# Patient Record
Sex: Female | Born: 1940 | Race: Black or African American | Hispanic: No | Marital: Married | Smoking: Former smoker
Health system: Southern US, Community
[De-identification: ages and names within clinical notes are randomized; demographics above are authoritative.]

## PROBLEM LIST (undated history)

## (undated) DIAGNOSIS — R911 Solitary pulmonary nodule: Secondary | ICD-10-CM

## (undated) DIAGNOSIS — I10 Essential (primary) hypertension: Secondary | ICD-10-CM

## (undated) DIAGNOSIS — R059 Cough, unspecified: Secondary | ICD-10-CM

## (undated) DIAGNOSIS — Z860100 Personal history of colon polyps, unspecified: Secondary | ICD-10-CM

## (undated) DIAGNOSIS — K272 Acute peptic ulcer, site unspecified, with both hemorrhage and perforation: Secondary | ICD-10-CM

## (undated) DIAGNOSIS — R05 Cough: Secondary | ICD-10-CM

## (undated) DIAGNOSIS — C3492 Malignant neoplasm of unspecified part of left bronchus or lung: Secondary | ICD-10-CM

## (undated) DIAGNOSIS — M1612 Unilateral primary osteoarthritis, left hip: Secondary | ICD-10-CM

## (undated) DIAGNOSIS — K219 Gastro-esophageal reflux disease without esophagitis: Secondary | ICD-10-CM

## (undated) DIAGNOSIS — H409 Unspecified glaucoma: Secondary | ICD-10-CM

## (undated) DIAGNOSIS — I639 Cerebral infarction, unspecified: Secondary | ICD-10-CM

## (undated) DIAGNOSIS — G459 Transient cerebral ischemic attack, unspecified: Secondary | ICD-10-CM

## (undated) HISTORY — DX: Essential (primary) hypertension: I10

## (undated) HISTORY — PX: OTHER SURGICAL HISTORY: SHX169

## (undated) HISTORY — DX: Unilateral primary osteoarthritis, left hip: M16.12

## (undated) HISTORY — DX: Gastro-esophageal reflux disease without esophagitis: K21.9

## (undated) HISTORY — PX: COLON SURGERY: SHX602

## (undated) HISTORY — DX: Transient cerebral ischemic attack, unspecified: G45.9

## (undated) HISTORY — DX: Personal history of colon polyps, unspecified: Z86.0100

## (undated) HISTORY — DX: Cough, unspecified: R05.9

## (undated) HISTORY — DX: Personal history of colonic polyps: Z86.010

## (undated) HISTORY — PX: EYE SURGERY: SHX253

## (undated) HISTORY — DX: Solitary pulmonary nodule: R91.1

## (undated) HISTORY — DX: Cough: R05

## (undated) HISTORY — PX: TUBAL LIGATION: SHX77

## (undated) HISTORY — DX: Acute peptic ulcer, site unspecified, with both hemorrhage and perforation: K27.2

---

## 1997-09-03 ENCOUNTER — Inpatient Hospital Stay (HOSPITAL_COMMUNITY): Admission: RE | Admit: 1997-09-03 | Discharge: 1997-09-09 | Payer: Self-pay | Admitting: *Deleted

## 1997-10-08 ENCOUNTER — Ambulatory Visit (HOSPITAL_COMMUNITY): Admission: RE | Admit: 1997-10-08 | Discharge: 1997-10-08 | Payer: Self-pay | Admitting: *Deleted

## 1997-10-31 ENCOUNTER — Ambulatory Visit (HOSPITAL_COMMUNITY): Admission: RE | Admit: 1997-10-31 | Discharge: 1997-10-31 | Payer: Self-pay | Admitting: *Deleted

## 1998-04-19 ENCOUNTER — Ambulatory Visit (HOSPITAL_COMMUNITY): Admission: RE | Admit: 1998-04-19 | Discharge: 1998-04-19 | Payer: Self-pay | Admitting: *Deleted

## 1998-04-30 ENCOUNTER — Encounter: Payer: Self-pay | Admitting: *Deleted

## 1998-04-30 ENCOUNTER — Ambulatory Visit (HOSPITAL_COMMUNITY): Admission: RE | Admit: 1998-04-30 | Discharge: 1998-04-30 | Payer: Self-pay | Admitting: *Deleted

## 1998-05-17 ENCOUNTER — Ambulatory Visit (HOSPITAL_COMMUNITY): Admission: RE | Admit: 1998-05-17 | Discharge: 1998-05-17 | Payer: Self-pay | Admitting: *Deleted

## 1998-06-02 ENCOUNTER — Emergency Department (HOSPITAL_COMMUNITY): Admission: EM | Admit: 1998-06-02 | Discharge: 1998-06-03 | Payer: Self-pay | Admitting: Emergency Medicine

## 1998-11-05 ENCOUNTER — Ambulatory Visit (HOSPITAL_COMMUNITY): Admission: RE | Admit: 1998-11-05 | Discharge: 1998-11-05 | Payer: Self-pay | Admitting: Cardiology

## 1998-11-05 ENCOUNTER — Encounter: Payer: Self-pay | Admitting: Cardiology

## 1999-04-04 ENCOUNTER — Ambulatory Visit (HOSPITAL_COMMUNITY): Admission: RE | Admit: 1999-04-04 | Discharge: 1999-04-04 | Payer: Self-pay | Admitting: *Deleted

## 1999-05-09 ENCOUNTER — Encounter: Payer: Self-pay | Admitting: *Deleted

## 1999-05-09 ENCOUNTER — Encounter: Admission: RE | Admit: 1999-05-09 | Discharge: 1999-05-09 | Payer: Self-pay | Admitting: *Deleted

## 1999-09-05 ENCOUNTER — Ambulatory Visit (HOSPITAL_COMMUNITY): Admission: RE | Admit: 1999-09-05 | Discharge: 1999-09-05 | Payer: Self-pay | Admitting: Neurosurgery

## 1999-09-05 ENCOUNTER — Encounter: Payer: Self-pay | Admitting: Neurosurgery

## 1999-09-19 ENCOUNTER — Encounter: Payer: Self-pay | Admitting: Neurosurgery

## 1999-09-19 ENCOUNTER — Ambulatory Visit (HOSPITAL_COMMUNITY): Admission: RE | Admit: 1999-09-19 | Discharge: 1999-09-19 | Payer: Self-pay | Admitting: Neurosurgery

## 1999-10-03 ENCOUNTER — Encounter: Payer: Self-pay | Admitting: Neurosurgery

## 1999-10-03 ENCOUNTER — Ambulatory Visit (HOSPITAL_COMMUNITY): Admission: RE | Admit: 1999-10-03 | Discharge: 1999-10-03 | Payer: Self-pay | Admitting: Neurosurgery

## 2000-01-09 ENCOUNTER — Encounter: Payer: Self-pay | Admitting: Cardiology

## 2000-01-09 ENCOUNTER — Ambulatory Visit (HOSPITAL_COMMUNITY): Admission: RE | Admit: 2000-01-09 | Discharge: 2000-01-09 | Payer: Self-pay | Admitting: Cardiology

## 2000-01-20 ENCOUNTER — Ambulatory Visit (HOSPITAL_COMMUNITY): Admission: RE | Admit: 2000-01-20 | Discharge: 2000-01-20 | Payer: Self-pay | Admitting: Neurosurgery

## 2000-01-20 ENCOUNTER — Encounter: Payer: Self-pay | Admitting: Neurosurgery

## 2000-05-27 ENCOUNTER — Emergency Department (HOSPITAL_COMMUNITY): Admission: EM | Admit: 2000-05-27 | Discharge: 2000-05-27 | Payer: Self-pay | Admitting: Emergency Medicine

## 2000-08-01 ENCOUNTER — Emergency Department (HOSPITAL_COMMUNITY): Admission: EM | Admit: 2000-08-01 | Discharge: 2000-08-01 | Payer: Self-pay | Admitting: Emergency Medicine

## 2000-12-24 ENCOUNTER — Encounter (INDEPENDENT_AMBULATORY_CARE_PROVIDER_SITE_OTHER): Payer: Self-pay | Admitting: Specialist

## 2000-12-24 ENCOUNTER — Ambulatory Visit (HOSPITAL_COMMUNITY): Admission: RE | Admit: 2000-12-24 | Discharge: 2000-12-24 | Payer: Self-pay | Admitting: *Deleted

## 2001-05-06 ENCOUNTER — Encounter: Payer: Self-pay | Admitting: Cardiology

## 2001-05-06 ENCOUNTER — Ambulatory Visit (HOSPITAL_COMMUNITY): Admission: RE | Admit: 2001-05-06 | Discharge: 2001-05-06 | Payer: Self-pay | Admitting: Cardiology

## 2001-05-13 ENCOUNTER — Ambulatory Visit (HOSPITAL_COMMUNITY): Admission: RE | Admit: 2001-05-13 | Discharge: 2001-05-13 | Payer: Self-pay | Admitting: Cardiology

## 2001-05-13 ENCOUNTER — Encounter: Payer: Self-pay | Admitting: Cardiology

## 2001-09-30 ENCOUNTER — Encounter: Payer: Self-pay | Admitting: Cardiology

## 2001-09-30 ENCOUNTER — Ambulatory Visit (HOSPITAL_COMMUNITY): Admission: RE | Admit: 2001-09-30 | Discharge: 2001-09-30 | Payer: Self-pay | Admitting: Cardiology

## 2002-08-14 ENCOUNTER — Encounter: Payer: Self-pay | Admitting: *Deleted

## 2002-08-14 ENCOUNTER — Ambulatory Visit (HOSPITAL_COMMUNITY): Admission: RE | Admit: 2002-08-14 | Discharge: 2002-08-14 | Payer: Self-pay | Admitting: Cardiology

## 2002-12-15 ENCOUNTER — Encounter: Admission: RE | Admit: 2002-12-15 | Discharge: 2002-12-15 | Payer: Self-pay | Admitting: Cardiology

## 2002-12-15 ENCOUNTER — Encounter: Payer: Self-pay | Admitting: Cardiology

## 2004-07-15 ENCOUNTER — Ambulatory Visit (HOSPITAL_COMMUNITY): Admission: RE | Admit: 2004-07-15 | Discharge: 2004-07-15 | Payer: Self-pay | Admitting: Gastroenterology

## 2004-07-23 ENCOUNTER — Ambulatory Visit (HOSPITAL_COMMUNITY): Admission: RE | Admit: 2004-07-23 | Discharge: 2004-07-23 | Payer: Self-pay | Admitting: Gastroenterology

## 2005-05-01 ENCOUNTER — Ambulatory Visit (HOSPITAL_COMMUNITY): Admission: RE | Admit: 2005-05-01 | Discharge: 2005-05-01 | Payer: Self-pay | Admitting: Cardiology

## 2006-07-08 ENCOUNTER — Ambulatory Visit (HOSPITAL_COMMUNITY): Admission: RE | Admit: 2006-07-08 | Discharge: 2006-07-08 | Payer: Self-pay | Admitting: Cardiology

## 2006-07-30 ENCOUNTER — Encounter: Admission: RE | Admit: 2006-07-30 | Discharge: 2006-07-30 | Payer: Self-pay | Admitting: Cardiology

## 2007-07-13 ENCOUNTER — Ambulatory Visit (HOSPITAL_COMMUNITY): Admission: RE | Admit: 2007-07-13 | Discharge: 2007-07-13 | Payer: Self-pay | Admitting: Orthopedic Surgery

## 2007-07-27 ENCOUNTER — Ambulatory Visit (HOSPITAL_COMMUNITY): Admission: RE | Admit: 2007-07-27 | Discharge: 2007-07-27 | Payer: Self-pay | Admitting: Orthopedic Surgery

## 2008-11-29 ENCOUNTER — Encounter: Admission: RE | Admit: 2008-11-29 | Discharge: 2008-11-29 | Payer: Self-pay | Admitting: Cardiology

## 2009-01-30 ENCOUNTER — Ambulatory Visit: Payer: Self-pay | Admitting: Pulmonary Disease

## 2009-01-30 DIAGNOSIS — I10 Essential (primary) hypertension: Secondary | ICD-10-CM | POA: Insufficient documentation

## 2009-01-30 DIAGNOSIS — K219 Gastro-esophageal reflux disease without esophagitis: Secondary | ICD-10-CM | POA: Insufficient documentation

## 2009-01-30 DIAGNOSIS — R059 Cough, unspecified: Secondary | ICD-10-CM | POA: Insufficient documentation

## 2009-01-30 DIAGNOSIS — R05 Cough: Secondary | ICD-10-CM

## 2009-03-05 ENCOUNTER — Ambulatory Visit: Payer: Self-pay | Admitting: Pulmonary Disease

## 2009-04-16 ENCOUNTER — Ambulatory Visit: Payer: Self-pay | Admitting: Pulmonary Disease

## 2009-06-01 DIAGNOSIS — G459 Transient cerebral ischemic attack, unspecified: Secondary | ICD-10-CM

## 2009-06-01 HISTORY — DX: Transient cerebral ischemic attack, unspecified: G45.9

## 2009-07-22 ENCOUNTER — Ambulatory Visit: Payer: Self-pay | Admitting: Pulmonary Disease

## 2009-07-22 LAB — CONVERTED CEMR LAB: IgE (Immunoglobulin E), Serum: 18.1 intl units/mL (ref 0.0–180.0)

## 2009-07-24 ENCOUNTER — Ambulatory Visit: Payer: Self-pay | Admitting: Cardiology

## 2009-08-26 ENCOUNTER — Ambulatory Visit: Payer: Self-pay | Admitting: Pulmonary Disease

## 2010-01-22 ENCOUNTER — Ambulatory Visit: Payer: Self-pay | Admitting: Cardiology

## 2010-02-17 ENCOUNTER — Ambulatory Visit: Payer: Self-pay | Admitting: Pulmonary Disease

## 2010-02-17 DIAGNOSIS — C3490 Malignant neoplasm of unspecified part of unspecified bronchus or lung: Secondary | ICD-10-CM | POA: Insufficient documentation

## 2010-03-06 ENCOUNTER — Ambulatory Visit: Payer: Self-pay | Admitting: Internal Medicine

## 2010-03-06 DIAGNOSIS — M1990 Unspecified osteoarthritis, unspecified site: Secondary | ICD-10-CM | POA: Insufficient documentation

## 2010-03-06 DIAGNOSIS — K272 Acute peptic ulcer, site unspecified, with both hemorrhage and perforation: Secondary | ICD-10-CM | POA: Insufficient documentation

## 2010-03-06 DIAGNOSIS — D126 Benign neoplasm of colon, unspecified: Secondary | ICD-10-CM | POA: Insufficient documentation

## 2010-03-06 DIAGNOSIS — M19019 Primary osteoarthritis, unspecified shoulder: Secondary | ICD-10-CM | POA: Insufficient documentation

## 2010-03-18 ENCOUNTER — Ambulatory Visit: Payer: Self-pay | Admitting: Internal Medicine

## 2010-03-18 LAB — CONVERTED CEMR LAB
Basophils Absolute: 0 10*3/uL (ref 0.0–0.1)
Basophils Relative: 0.7 % (ref 0.0–3.0)
Bilirubin, Direct: 0 mg/dL (ref 0.0–0.3)
CO2: 32 meq/L (ref 19–32)
Calcium: 9.5 mg/dL (ref 8.4–10.5)
Chloride: 104 meq/L (ref 96–112)
Cholesterol: 212 mg/dL — ABNORMAL HIGH (ref 0–200)
Creatinine, Ser: 1.1 mg/dL (ref 0.4–1.2)
Direct LDL: 100.8 mg/dL
Eosinophils Absolute: 0.1 10*3/uL (ref 0.0–0.7)
Eosinophils Relative: 1.9 % (ref 0.0–5.0)
Hemoglobin, Urine: NEGATIVE
Ketones, ur: NEGATIVE mg/dL
Lymphs Abs: 1.9 10*3/uL (ref 0.7–4.0)
Monocytes Absolute: 0.7 10*3/uL (ref 0.1–1.0)
Monocytes Relative: 10.4 % (ref 3.0–12.0)
Neutrophils Relative %: 58.6 % (ref 43.0–77.0)
Nitrite: NEGATIVE
Potassium: 4.7 meq/L (ref 3.5–5.1)
Sodium: 144 meq/L (ref 135–145)
Total CHOL/HDL Ratio: 5
Total Protein, Urine: NEGATIVE mg/dL
Urine Glucose: NEGATIVE mg/dL
pH: 6 (ref 5.0–8.0)

## 2010-03-20 ENCOUNTER — Encounter: Payer: Self-pay | Admitting: Internal Medicine

## 2010-03-20 ENCOUNTER — Ambulatory Visit: Payer: Self-pay | Admitting: Internal Medicine

## 2010-03-29 ENCOUNTER — Ambulatory Visit: Payer: Self-pay | Admitting: Internal Medicine

## 2010-04-07 ENCOUNTER — Encounter: Payer: Self-pay | Admitting: Internal Medicine

## 2010-05-22 ENCOUNTER — Encounter: Payer: Self-pay | Admitting: Internal Medicine

## 2010-05-28 ENCOUNTER — Encounter: Payer: Self-pay | Admitting: Internal Medicine

## 2010-06-05 ENCOUNTER — Encounter
Admission: RE | Admit: 2010-06-05 | Discharge: 2010-06-05 | Payer: Self-pay | Source: Home / Self Care | Attending: Gastroenterology | Admitting: Gastroenterology

## 2010-06-18 ENCOUNTER — Encounter: Payer: Self-pay | Admitting: Internal Medicine

## 2010-06-21 ENCOUNTER — Encounter: Payer: Self-pay | Admitting: Cardiology

## 2010-06-22 ENCOUNTER — Encounter: Payer: Self-pay | Admitting: Gastroenterology

## 2010-06-24 ENCOUNTER — Other Ambulatory Visit: Payer: Self-pay | Admitting: Pulmonary Disease

## 2010-06-24 DIAGNOSIS — R911 Solitary pulmonary nodule: Secondary | ICD-10-CM

## 2010-07-01 NOTE — Assessment & Plan Note (Signed)
Summary: coughing/ mbw   Visit Type:  Follow-up Copy to:  pcp Primary Provider/Referring Provider:  [name], MD  CC:  Pt c/o productive cough with small amount of  thick white mucus. Pt states was treated with an antibiotic by Dr. Elizebeth Koller x 2 months ago for a "cold" and red throat.  History of Present Illness: 70/F, ex smoker for FU of chronic cough since 3/10. This started after a bad cold that was treated with codeine cough syrup. Se has seen Dr Lottie Mussel for GERD & was put on zegerid - She has been taking the reduced dose generic version, with frequent breakthrough symptoms. Cough is worse at night, occasional white phlegm, tylenol cold medications seem to help. CXR 11/29/08 showed some hyperaeration but no infiltrates. Blood work incl CBC, BMET was nml. She smoked fro 25 Pyrs before quitting in 1980. She denies fevers, weight loss, seasonal allergies or childhood h/o asthma. Spirometry showd flattening of expiratory limb but no evidence of airway obstruction. Cough not much changed with trial of 1st gen anti-H, back on claritin D. Did not respond to trial of reglan.  April 16, 2009 2:12 PM  cough controlled on codeine , comes back if she stops.  July 22, 2009 2:09 PM  c/o productive cough with small amount of  thick white mucus. Pt states was treated with an antibiotic by Dr. Elizebeth Koller x 2 months ago for a "cold" and red throat. taking zegerid as needed once/ wk & claritin daily  Denies heartburn, diurnal or seasonal variation.  Current Medications (verified): 1)  Norvasc 10 Mg Tabs (Amlodipine Besylate) .... Take 1 Tablet By Mouth Once A Day 2)  Zegerid 40-1100 Mg Caps (Omeprazole-Sodium Bicarbonate) .... Take 1 Tablet By Mouth Once A Day 3)  Tylenol Extra Strength 500 Mg Tabs (Acetaminophen) .... As Needed 4)  Claritin-D 12 Hour 5-120 Mg Xr12h-Tab (Loratadine-Pseudoephedrine) .... Take 1 Tablet By Mouth Once A Day  Allergies (verified): 1)  ! Pcn  Past History:  Past  Medical History: Last updated: 01/30/2009 HYPERTENSION (ICD-401.9)    Social History: Last updated: 01/30/2009 Marital Status: married, lives with husband Children: yes, 2 Occupation: retired Patient states former smoker.   Review of Systems       The patient complains of prolonged cough.  The patient denies anorexia, fever, weight loss, weight gain, vision loss, decreased hearing, hoarseness, chest pain, syncope, dyspnea on exertion, peripheral edema, headaches, hemoptysis, abdominal pain, melena, hematochezia, severe indigestion/heartburn, hematuria, muscle weakness, suspicious skin lesions, difficulty walking, depression, unusual weight change, and abnormal bleeding.    Vital Signs:  Patient profile:   70 year old female Height:      65 inches Weight:      142.38 pounds O2 Sat:      97 % on Room air Temp:     98.0 degrees F oral Pulse rate:   85 / minute BP sitting:   120 / 82  (left arm) Cuff size:   regular  Vitals Entered By: Zackery Barefoot CMA (July 22, 2009 1:58 PM)  O2 Flow:  Room air CC: Pt c/o productive cough with small amount of  thick white mucus. Pt states was treated with an antibiotic by Dr. Elizebeth Koller x 2 months ago for a "cold" and red throat Comments Medications reviewed with patient Verified contact number and pharmacy with patient Zackery Barefoot CMA  July 22, 2009 1:59 PM    Physical Exam  Additional Exam:  Gen. Pleasant, well-nourished, in no distress, normal affect ENT -  no lesions, no post nasal drip Neck: No JVD, no thyromegaly, no carotid bruits Lungs: no use of accessory muscles, no dullness to percussion, clear without rales or rhonchi  Cardiovascular: Rhythm regular, heart sounds  normal, no murmurs or gallops, no peripheral edema Musculoskeletal: No deformities, no cyanosis or clubbing      Impression & Recommendations:  Problem # 1:  COUGH (ICD-786.2) Intensify upper airway cough therapy with nasal steroid &  claritin Orders: T-"RAST" (Allergy Full Profile) IGE ([id number]) Radiology Referral (Radiology) Est. Patient Level III (78469)  Problem # 2:  G E REFLUX (ICD-530.81)  stay on zegerid x 4-6 wks daily. Her updated medication list for this problem includes:    Zegerid 40-1100 Mg Caps (Omeprazole-sodium bicarbonate) .Marland Kitchen... Take 1 tablet by mouth once a day  Orders: Est. Patient Level III (62952)  Patient Instructions: 1)  Copy sent to: Dr Shana Chute 2)  Please schedule a follow-up appointment in 1 month. 3)  Blood owrk for allergy testing 4)  A High Resolution Chest CT has been recommended.  Your imaging study may require preauthorization.  5)  Stay on zegerid & claritin daily x 1 month 6)  Nasal spray at bedtime (sample)

## 2010-07-01 NOTE — Assessment & Plan Note (Signed)
Summary: 1 MONTH/ MBW   Visit Type:  Follow-up Copy to:  pcp Primary Provider/Referring Provider:  [name], MD  CC:  Pt here for one month follow up. Pt states productive cough is less.  History of Present Illness: 68/F, ex smoker for FU of chronic cough since 3/10 attributed to upper airway cough & GERD. This started after a bad cold that was treated with codeine cough syrup. Se has seen Dr Lottie Mussel for GERD & was put on zegerid - She has been taking the reduced dose generic version, with frequent breakthrough symptoms. Cough is worse at night, occasional white phlegm, tylenol cold medications seem to help. CXR 11/29/08 showed some hyperaeration but no infiltrates. Blood work incl CBC, BMET was nml. She smoked fro 25 Pyrs before quitting in 1980. She denies fevers, weight loss, seasonal allergies or childhood h/o asthma. Spirometry showd flattening of expiratory limb but no evidence of airway obstruction. Cough not much changed with trial of 1st gen anti-H, back on claritin D. Did not respond to trial of reglan. cough controlled on codeine , comes back if she stops.   August 26, 2009 11:02 AM  allergy panel nml.  2/23 >>CT scan shows small spot in left lower lung - not worrisome , but recomm FU CT in 6 months. Minimal scar tissue - no reason for her cough on this. Cough is improved on regimen of nasal steroid, zegerid & claritin daily x 1 month   Denies heartburn, diurnal or seasonal variation.  Current Medications (verified): 1)  Exforge 5-160 Mg Tabs (Amlodipine Besylate-Valsartan) .... Take 1 Tablet By Mouth Once A Day 2)  Zegerid 40-1100 Mg Caps (Omeprazole-Sodium Bicarbonate) .... Take 1 Tablet By Mouth Once A Day 3)  Tylenol Extra Strength 500 Mg Tabs (Acetaminophen) .... As Needed 4)  Claritin-D 12 Hour 5-120 Mg Xr12h-Tab (Loratadine-Pseudoephedrine) .... Take 1 Tablet By Mouth Once A Day 5)  Naproxen 500 Mg Tabs (Naproxen) .... Take 1 Tablet By Mouth Two Times A Day As  Needed  Allergies (verified): 1)  ! Pcn  Past History:  Past Medical History: Last updated: 01/30/2009 HYPERTENSION (ICD-401.9)    Social History: Last updated: 01/30/2009 Marital Status: married, lives with husband Children: yes, 2 Occupation: retired Patient states former smoker.   Review of Systems       The patient complains of prolonged cough.  The patient denies anorexia, fever, weight loss, weight gain, vision loss, decreased hearing, hoarseness, chest pain, syncope, dyspnea on exertion, peripheral edema, headaches, hemoptysis, abdominal pain, melena, hematochezia, severe indigestion/heartburn, hematuria, muscle weakness, suspicious skin lesions, transient blindness, difficulty walking, depression, unusual weight change, and abnormal bleeding.    Vital Signs:  Patient profile:   70 year old female Height:      65 inches Weight:      142.25 pounds O2 Sat:      96 % on Room air Temp:     98.0 degrees F oral Pulse rate:   98 / minute BP sitting:   138 / 90  (left arm) Cuff size:   regular  Vitals Entered By: Zackery Barefoot CMA (August 26, 2009 10:57 AM)  O2 Flow:  Room air CC: Pt here for one month follow up. Pt states productive cough is less Comments Medications reviewed with patient Verified contact number and pharmacy with patient Zackery Barefoot CMA  August 26, 2009 10:58 AM    Physical Exam  Additional Exam:  Gen. Pleasant, well-nourished, in no distress, normal affect ENT - no lesions, no  post nasal drip Neck: No JVD, no thyromegaly, no carotid bruits Lungs: no use of accessory muscles, no dullness to percussion, clear without rales or rhonchi  Cardiovascular: Rhythm regular, heart sounds  normal, no murmurs or gallops, no peripheral edema Musculoskeletal: No deformities, no cyanosis or clubbing      Impression & Recommendations:  Problem # 1:  COUGH (ICD-786.2) Favor combination of upper airway cough & GERD here. Cough syrup at bedtime as needed   Continue zegerid Continue claritin D & nasonex  Orders: Est. Patient Level III (25427) Radiology Referral (Radiology)  Medications Added to Medication List This Visit: 1)  Exforge 5-160 Mg Tabs (Amlodipine besylate-valsartan) .... Take 1 tablet by mouth once a day 2)  Naproxen 500 Mg Tabs (Naproxen) .... Take 1 tablet by mouth two times a day as needed 3)  Promethazine-codeine 6.25-10 Mg/50ml Syrp (Promethazine-codeine) .... 2.5- 5ml by mouth at bedtime as needed for cough  Patient Instructions: 1)  Please schedule a follow-up appointment in 4 months after CT scan 2)  Cough syrup at bedtime as needed  3)  Continue zegerid 4)  Continue claritin D & nasonex  5)  Copy sent to: Dr Shana Chute Prescriptions: PROMETHAZINE-CODEINE 6.25-10 MG/5ML SYRP (PROMETHAZINE-CODEINE) 2.5- 5ml by mouth at bedtime as needed for cough  #150 x 0   Entered and Authorized by:   Comer Locket Vassie Loll MD   Signed by:   Comer Locket Vassie Loll MD on 08/26/2009   Method used:   Print then Give to Patient   RxID:   [id number]

## 2010-07-01 NOTE — Assessment & Plan Note (Signed)
Summary: NEW/ [employer]  #   Vital Signs:  Patient profile:   70 year old female Height:      65 inches Weight:      136 pounds BMI:     22.71 O2 Sat:      97 % on Room air Temp:     97.5 degrees F oral Pulse rate:   83 / minute BP sitting:   128 / 82  (left arm) Cuff size:   regular  Vitals Entered By: [name] (March 06, 2010 2:51 PM)  O2 Flow:  Room air CC: new pt here to est care with primary md/ab   Primary Care Provider:  [name], MD  CC:  new pt here to est care with primary md/ab.  History of Present Illness: Patient presents to establish for on-going medical care. Her chief complaint is dryness and dry skin which she associates with Exforge with 25mg  HCTZ. She is otherwise doing well.   Preventive Screening-Counseling & Management  Alcohol-Tobacco     Alcohol drinks/day: 0     Smoking Status: quit     Year Quit: 1980  Caffeine-Diet-Exercise     Caffeine use/day: 2 cups daily     Diet Comments: no dietary restrictions no      Does Patient Exercise: no     MSH Depression Score: no depression  Hep-HIV-STD-Contraception     Dental Visit-last 6 months no     SBE monthly: yes     Sun Exposure-Excessive: no  Safety-Violence-Falls     Seat Belt Use: yes     Helmet Use: n/a     Firearms in the Home: no firearms in the home     Smoke Detectors: yes     Violence in the Home: no risk noted     Sexual Abuse: no     Fall Risk: Low fall risk      Sexual History:  currently monogamous.        Drug Use:  never.        Blood Transfusions:  no.    Current Medications (verified): 1)  Zegerid 40-1100 Mg Caps (Omeprazole-Sodium Bicarbonate) .... Take 1 Tablet By Mouth Once A Day 2)  Tylenol Extra Strength 500 Mg Tabs (Acetaminophen) .... As Needed 3)  Claritin-D 12 Hour 5-120 Mg Xr12h-Tab (Loratadine-Pseudoephedrine) .... Take 1 Tablet By Mouth Once A Day As Needed 4)  Naproxen 500 Mg Tabs (Naproxen) .... Take 1 Tablet By Mouth Two Times A  Day As Needed 5)  Exforge Hct 10-320-25 Mg Tabs (Amlodipine-Valsartan-Hctz) .Marland Kitchen.. 1 Tablet Daily  Allergies (verified): 1)  ! 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 Kinnie Scales          Pul - Dr. Vassie Loll          ortho - Dr. Pollie Meyer      Past Surgical History: * Hx of COLON SURGERY- Surgical repair peptic ULCER TUBAL LIGATION, HX OF (ICD-V26.51)  G2Ps  Family History: Father - deceased @79 : Colon  Cancer Mother- deceased @ 28: CVA, HTN, CAD/MI Neg- prostate or breast cancer; DM  Social History: HSG Married - '62- 2 dtrs - '65, '70; grandchildren 3 Occupation:  retired-mfg inspector Marriage End-of-Life: No CPR, no intubation, no heroic or futile measures. (provided End of life packet)Caffeine use/day:  2 cups daily Does Patient Exercise:  no Dental Care w/in 6 mos.:  no Sun Exposure-Excessive:  no Seat Belt Use:  yes Fall Risk:  Low fall risk Drug Use:  never Blood Transfusions:  no Sexual History:  currently monogamous  Review of Systems       The patient complains of prolonged cough and severe indigestion/heartburn.  The patient denies anorexia, fever, weight loss, weight gain, vision loss, decreased hearing, hoarseness, chest pain, syncope, dyspnea on exertion, peripheral edema, headaches, hemoptysis, abdominal pain, melena, hematochezia, hematuria, incontinence, muscle weakness, suspicious skin lesions, difficulty walking, depression, unusual weight change, abnormal bleeding, enlarged lymph nodes, and breast masses.    Physical Exam  General:  WNWD AA female who appears younger than her stated age. Head:  normocephalic and  no abnormalities observed.   Eyes:  pupils equal, pupils round, corneas and lenses clear, and no injection.   Ears:  External ear exam shows no significant lesions or deformities.  Otoscopic examination reveals clear canals, tympanic membranes are intact bilaterally without bulging, retraction, inflammation or discharge. Hearing is grossly normal bilaterally. Nose:  no external deformity and no external erythema.   Mouth:  Oral mucosa and oropharynx without lesions or exudates.  Teeth in good repair. Neck:  supple, no masses, and no thyromegaly.   Chest Wall:  no deformities.   Lungs:  normal respiratory effort and normal breath sounds.   Heart:  normal rate, regular rhythm, and no murmur.   Pulses:  2+ radial pulse Extremities:  No clubbing, cyanosis, edema, or deformity noted with normal full range of motion of all joints.   Neurologic:  alert & oriented X3.   Skin:  turgor normal and color normal.   Cervical Nodes:  no anterior cervical adenopathy and no posterior cervical adenopathy.   Psych:  Oriented X3, normally interactive, good eye contact, and not anxious appearing.     Impression & Recommendations:  Problem # 1:  HYPERTENSION (ICD-401.9)  Her updated medication list for this problem includes:    Exforge Hct 10-320-25 Mg Tabs (Amlodipine-valsartan-hctz) .Marland Kitchen... 1 tablet daily  BP today: 128/82 Prior BP: 120/84 (02/17/2010)  Good control on present medication. Will consider change in meds to eliminate diuretic if dryness persists and no metabolic cause is identified.  Problem # 2:  Preventive Health Care (ICD-V70.0)  Patient does not have any significant medical complaints. LImited exam is normal. She will return for a vulvo-vaginal exam and breast exam. She will have health maintenance labs before her follow-up visit. She is current with colorectal cancer screening. Last mammogram in Dec '10. Immunizations - fu and pneumonia vaccine today.  Ms. Scroggs has no signs or symptoms  of depression. She is fully independent in all ADLs and she cognitively functions at a normal level running her household and keeping an active social calendar. She has had no falls and has no fall risk.  In summary  a very nice woman who has established for continuity care. She will return in the near future for a complete physical exam.  Orders: Medicare -1st Annual Wellness Visit [phone])  Complete Medication List: 1)  Zegerid 40-1100 Mg Caps (Omeprazole-sodium bicarbonate) .... Take 1 tablet by mouth once a day 2)  Tylenol Extra Strength 500 Mg Tabs (Acetaminophen) .... As needed 3)  Claritin-d 12 Hour 5-120 Mg Xr12h-tab (Loratadine-pseudoephedrine) .... Take 1 tablet by mouth once a day  as needed 4)  Naproxen 500 Mg Tabs (Naproxen) .... Take 1 tablet by mouth two times a day as needed 5)  Exforge Hct 10-320-25 Mg Tabs (Amlodipine-valsartan-hctz) .Marland Kitchen.. 1 tablet daily   Preventive Care Screening  Colonoscopy:    Date:  12/12/2008    Results:  Hyperplastic Polyp   Mammogram:    Date:  12/12/2008    Results:  normal    Preventive Care Screening  Colonoscopy:    Date:  12/12/2008    Results:  Hyperplastic Polyp   Mammogram:    Date:  12/12/2008    Results:  normal

## 2010-07-01 NOTE — Letter (Signed)
Summary: [facility]   Imported By: Sherian Rein 04/22/2010 08:18:03  _____________________________________________________________________  External Attachment:    Type:   Image     Comment:   External Document

## 2010-07-01 NOTE — Assessment & Plan Note (Signed)
Summary: PER DR MEN-2-3 WK EXTEND VISIT-FINISH YEARLY--STC   Vital Signs:  Patient profile:   70 year old female Height:      65 inches Weight:      139 pounds BMI:     23.21 O2 Sat:      98 % on Room air Temp:     97.9 degrees F oral Pulse rate:   77 / minute BP sitting:   130 / 84  (left arm) Cuff size:   regular  Vitals Entered By: [name] (March 20, 2010 11:32 AM)  O2 Flow:  Room air   Primary Care Provider:  [name], MD   History of Present Illness: Patient presents for wellness exam. She is feeling well. she did review the initial H&P from her last visit with no additions or corrections.     Current Medications (verified): 1)  Zegerid 40-1100 Mg Caps (Omeprazole-Sodium Bicarbonate) .... Take 1 Tablet By Mouth Once A Day 2)  Tylenol Extra Strength 500 Mg Tabs (Acetaminophen) .... As Needed 3)  Claritin-D 12 Hour 5-120 Mg Xr12h-Tab (Loratadine-Pseudoephedrine) .... Take 1 Tablet By Mouth Once A Day As Needed 4)  Naproxen 500 Mg Tabs (Naproxen) .... Take 1 Tablet By Mouth Two Times A Day As Needed 5)  Exforge Hct 10-320-25 Mg Tabs (Amlodipine-Valsartan-Hctz) .Marland Kitchen.. 1 Tablet Daily  Allergies (verified): 1)  ! Pcn PMH-FH-SH reviewed-no changes except otherwise noted  Review of Systems  The patient denies anorexia, fever, weight loss, weight gain, vision loss, decreased hearing, hoarseness, chest pain, syncope, dyspnea on exertion, prolonged cough, headaches, abdominal pain, severe indigestion/heartburn, incontinence, muscle weakness, suspicious skin lesions, difficulty walking, depression, unusual weight change, abnormal bleeding, enlarged lymph nodes, and breast masses.    Physical Exam  General:  WNWD AA woman who looks younger than her stated age, in no distress Head:  Normocephalic and atraumatic without obvious abnormalities. No apparent alopecia or balding. Eyes:  No corneal or conjunctival inflammation noted. EOMI. Perrla. Funduscopic exam benign,  without hemorrhages, exudates or papilledema. Vision grossly normal. Ears:  External ear exam shows no significant lesions or deformities.  Otoscopic examination reveals clear canals, tympanic membranes are intact bilaterally without bulging, retraction, inflammation or discharge. Hearing is grossly normal bilaterally. Nose:  no external deformity and no external erythema.   Mouth:  Oral mucosa and oropharynx without lesions or exudates.  Teeth in good repair. Neck:  supple, full ROM, no thyromegaly, and no carotid bruits.   Chest Wall:  no deformities.   Breasts:  No mass, nodules, thickening, tenderness, bulging, retraction, inflamation, nipple discharge or skin changes noted.   Lungs:  normal respiratory effort and normal breath sounds.   Heart:  Normal rate and regular rhythm. S1 and S2 normal without gallop, murmur, click, rub or other extra sounds. Abdomen:  soft, non-tender, normal bowel sounds, no guarding, and no hepatomegaly.   Rectal:  no external abnormalities and no hemorrhoids.   Genitalia:  normal introitus, no external lesions, mucosa pink and moist, no vaginal or cervical lesions, no vaginal atrophy, and no friaility or hemorrhage.  Scant amount of vaginal discharge, thin and grey. Msk:  normal ROM, no joint tenderness, no joint swelling, and no redness over joints.   Pulses:  2+ radial Neurologic:  alert & oriented X3, cranial nerves II-XII intact, and gait normal.   Skin:  turgor normal and color normal.   Axillary Nodes:  no R axillary adenopathy and no L axillary adenopathy.   Inguinal Nodes:  no R  inguinal adenopathy and no L inguinal adenopathy.   Psych:  Oriented X3, normally interactive, and good eye contact.     Impression & Recommendations:  Problem # 1:  HYPERTENSION (ICD-401.9)  Her updated medication list for this problem includes:    Exforge Hct 10-320-25 Mg Tabs (Amlodipine-valsartan-hctz) .Marland Kitchen... 1 tablet daily  BP today: 130/84 Prior BP: 128/82  (03/06/2010)  Good control on present medications : will continue the same.  Problem # 2:  Preventive Health Care (ICD-V70.0)  see previous note for detailed history. Physical exam including breast and pelvic exam are normal. Lab results are within normal limits except for mild elevation in tryglycerides at 281 - very diet amenable. The HDL ( good cholesterol) is in normal range and the LDL ( bad cholesterol) is at optimal level of 100.8. Urinalysis does reveal a mild infection. Breast exam is normal and pelvic exam is normal except for scant discharge suggestive of bacterial vaginosis. Flaygl is prescribed to cover both minor infections. 12 Lead EKG reveals a normal pattern with no evidence of ischemic heart disease.  In summary - patient with a normal well woman exam except for minor infections as noted. She will return as needed or in one year.   Orders: Pelvic & Breast Exam ( Medicare)  (G0101)  Complete Medication List: 1)  Zegerid 40-1100 Mg Caps (Omeprazole-sodium bicarbonate) .... Take 1 tablet by mouth once a day 2)  Tylenol Extra Strength 500 Mg Tabs (Acetaminophen) .... As needed 3)  Claritin-d 12 Hour 5-120 Mg Xr12h-tab (Loratadine-pseudoephedrine) .... Take 1 tablet by mouth once a day as needed 4)  Naproxen 500 Mg Tabs (Naproxen) .... Take 1 tablet by mouth two times a day as needed 5)  Exforge Hct 10-320-25 Mg Tabs (Amlodipine-valsartan-hctz) .Marland Kitchen.. 1 tablet daily 6)  Metronidazole 500 Mg Tabs (Metronidazole) .Marland Kitchen.. 1 by mouth two times a day for bv  Other Orders: Tdap => 55yrs IM (16109) Admin 1st Vaccine (60454) EKG w/ Interpretation (93000)   Patient: Amy Dunn Note: All result statuses are Final unless otherwise noted.  Tests: (1) BMP (METABOL)   Sodium                    144 mEq/L                   135-145   Potassium                 4.7 mEq/L                   3.5-5.1   Chloride                  104 mEq/L                   96-112   Carbon Dioxide            32 mEq/L                     19-32   Glucose                   93 mg/dL                    09-81   BUN                       21 mg/dL  6-23   Creatinine                1.1 mg/dL                   5.7-8.4   Calcium                   9.5 mg/dL                   6.9-62.9   GFR                       62.24 mL/min                >60  Tests: (2) CBC Platelet w/Diff (CBCD)   White Cell Count          6.8 K/uL                    4.5-10.5   Red Cell Count            3.96 Mil/uL                 3.87-5.11   Hemoglobin           [L]  11.5 g/dL                   52.8-41.3   Hematocrit           [L]  32.9 %                      36.0-46.0   MCV                       83.2 fl                     78.0-100.0   MCHC                      34.9 g/dL                   24.4-01.0   RDW                  [H]  14.8 %                      11.5-14.6   Platelet Count            176.0 K/uL                  150.0-400.0   Neutrophil %              58.6 %                      43.0-77.0   Lymphocyte %              28.4 %                      12.0-46.0   Monocyte %                10.4 %                      3.0-12.0   Eosinophils%              1.9 %  0.0-5.0   Basophils %               0.7 %                       0.0-3.0   Neutrophill Absolute      4.0 K/uL                    1.4-7.7   Lymphocyte Absolute       1.9 K/uL                    0.7-4.0   Monocyte Absolute         0.7 K/uL                    0.1-1.0  Eosinophils, Absolute                             0.1 K/uL                    0.0-0.7   Basophils Absolute        0.0 K/uL                    0.0-0.1  Tests: (3) Hepatic/Liver Function Panel (HEPATIC)   Total Bilirubin           0.6 mg/dL                   0.2-7.2   Direct Bilirubin          0.0 mg/dL                   5.3-6.6   Alkaline Phosphatase      92 U/L                      39-117   AST                       20 U/L                      0-37   ALT                       17 U/L                       0-35   Total Protein             7.4 g/dL                    4.4-0.3   Albumin                   4.2 g/dL                    4.7-4.2  Tests: (4) TSH (TSH)   FastTSH                   1.04 uIU/mL                 0.35-5.50  Tests: (5) Lipid Panel (LIPID)   Cholesterol          [H]  212 mg/dL  0-200     ATP III Classification            Desirable:  < 200 mg/dL                    Borderline High:  200 - 239 mg/dL               High:  > = 240 mg/dL   Triglycerides        [H]  281.0 mg/dL                 4.0-981.1     Normal:  <150 mg/dL     Borderline High:  914 - 199 mg/dL   HDL                       78.29 mg/dL                 >56.21   VLDL Cholesterol     [H]  56.2 mg/dL                  3.0-86.5  CHO/HDL Ratio:  CHD Risk                             5                    Men          Women     1/2 Average Risk     3.4          3.3     Average Risk          5.0          4.4     2X Average Risk          9.6          7.1     3X Average Risk          15.0          11.0                           Tests: (6) UDip w/Micro (URINE)   Color                     LT. YELLOW       RANGE:  Yellow;Lt. Yellow   Clarity                   CLEAR                       Clear   Specific Gravity          1.015                       1.000 - 1.030   Urine Ph                  6.0                         5.0-8.0   Protein                   NEGATIVE                    Negative  Urine Glucose             NEGATIVE                    Negative   Ketones                   NEGATIVE                    Negative   Urine Bilirubin           NEGATIVE                    Negative   Blood                     NEGATIVE                    Negative   Urobilinogen              0.2                         0.0 - 1.0   Leukocyte Esterace        SMALL                       Negative   Nitrite                   NEGATIVE                    Negative   Urine WBC                 3-6/hpf                      0-2/hpf   Urine RBC                 0-2/hpf                     0-2/hpf   Urine Mucus               Presence of                 None   Urine Epith               Few(5-10/hpf)               Rare(0-4/hpf)     Rare clue cells   Urine Bacteria            Many(>50/hpf)               None  Tests: (7) Cholesterol LDL - Direct (DIRLDL)  Cholesterol LDL - Direct                             100.8 mg/dL     Optimal:  <045 mg/dL     Near or Above Optimal:  100-129 mg/dL     Borderline High:  409-811 mg/dL     High:  914-782 mg/dL     Very High:  >956 mg/dLPrescriptions: METRONIDAZOLE 500 MG TABS (METRONIDAZOLE) 1 by mouth two times a day for BV  #14 x 0   Entered and Authorized by:   Jacques Navy MD   Signed by:   Jacques Navy MD on  03/20/2010   Method used:   Electronically to        [facility] [address] (retail)       [address]       [place], [place]  [id number]       Ph: [phone]       Fax: [phone]   RxID:   [id number]    Orders Added: 1)  Tdap => 68yrs IM [90715] 2)  Admin 1st Vaccine [90471] 3)  Est. Patient 65& > [44034] 4)  Pelvic & Breast Exam ( Medicare)  [G0101] 5)  Est. Patient Level II [74259] 6)  EKG w/ Interpretation [93000]   Immunizations Administered:  Tetanus Vaccine:    Vaccine Type: Tdap    Site: right deltoid    Mfr: GlaxoSmithKline    Dose: 0.5 ml    Route: IM    Given by: Ami Bullins CMA    Exp. Date: 03/20/2012    Lot #: [id number]    VIS given: 04/18/08 version given March 20, 2010.   Immunizations Administered:  Tetanus Vaccine:    Vaccine Type: Tdap    Site: right deltoid    Mfr: GlaxoSmithKline    Dose: 0.5 ml    Route: IM    Given by: Ami Bullins CMA    Exp. Date: 03/20/2012    Lot #: [id number]    VIS given: 04/18/08 version given March 20, 2010.

## 2010-07-01 NOTE — Assessment & Plan Note (Signed)
Summary: f/u results///kp   Visit Type:  Follow-up Copy to:  pcp Primary Provider/Referring Provider:  [name], MD  CC:  Pt here for follow up on CT. No new complaints or concerns.  History of Present Illness: 70/F, ex smoker for FU of  -chronic cough since 3/10 attributed to upper airway cough/ GERD AND - LLL nodule on CT feb 2011 , not seen on CT abd feb 2006 This started after a bad cold that was treated with codeine cough syrup. Se has seen Dr Lottie Mussel for GERD & was put on zegerid - She has been taking the reduced dose generic version, with frequent breakthrough symptoms. Cough is worse at night, occasional white phlegm, tylenol cold medications seem to help. CXR 11/29/08 showed some hyperaeration but no infiltrates. Blood work incl CBC, BMET was nml. She smoked fro 25 Pyrs before quitting in 1980. She denies fevers, weight loss, seasonal allergies or childhood h/o asthma. Spirometry showd flattening of expiratory limb but no evidence of airway obstruction. Cough not much changed with trial of 1st gen anti-H, back on claritin D. Did not respond to trial of reglan. cough controlled on codeine , comes back if she stops.   August 26, 2009 11:02 AM  allergy panel nml.  2/23 >>CT scan shows small spot in left lower lung not seen on CT abd in feb 2006 Minimal scar tissue - no reason for her cough on this. Cough is improved on regimen of nasal steroid, zegerid & claritin daily x 1 month   February 17, 2010 11:15 AM  Occasional cough twice/ week - resolve with codeine. stopped claritin D, continues zegerid. CT chest unchanged . Needs medical MD  Denies heartburn, diurnal or seasonal variation.  Preventive Screening-Counseling & Management  Alcohol-Tobacco     Smoking Status: quit     Packs/Day: 1.5     Year Started: 1955     Year Quit: 1975  Current Medications (verified): 1)  Zegerid 40-1100 Mg Caps (Omeprazole-Sodium Bicarbonate) .... Take 1 Tablet By Mouth Once A Day 2)   Tylenol Extra Strength 500 Mg Tabs (Acetaminophen) .... As Needed 3)  Claritin-D 12 Hour 5-120 Mg Xr12h-Tab (Loratadine-Pseudoephedrine) .... Take 1 Tablet By Mouth Once A Day As Needed 4)  Naproxen 500 Mg Tabs (Naproxen) .... Take 1 Tablet By Mouth Two Times A Day As Needed 5)  Diovan/hctz .... Take 1 Tablet By Mouth Once A Day Pt Unsure of Strength  Allergies (verified): 1)  ! Pcn  Past History:  Past Medical History: Last updated: 01/30/2009 HYPERTENSION (ICD-401.9)    Social History: Last updated: 01/30/2009 Marital Status: married, lives with husband Children: yes, 2 Occupation: retired Patient states former smoker.   Review of Systems       The patient complains of prolonged cough.  The patient denies anorexia, fever, weight loss, weight gain, vision loss, decreased hearing, hoarseness, chest pain, syncope, dyspnea on exertion, peripheral edema, headaches, hemoptysis, abdominal pain, melena, hematochezia, severe indigestion/heartburn, hematuria, muscle weakness, suspicious skin lesions, difficulty walking, depression, unusual weight change, abnormal bleeding, enlarged lymph nodes, and angioedema.    Vital Signs:  Patient profile:   70 year old female Height:      65 inches Weight:      139.25 pounds BMI:     23.26 O2 Sat:      99 % on Room air Temp:     98.1 degrees F oral Pulse rate:   84 / minute BP sitting:   120 / 84  (left  arm) Cuff size:   regular  Vitals Entered By: Zackery Barefoot CMA (February 17, 2010 11:08 AM)  O2 Flow:  Room air CC: Pt here for follow up on CT. No new complaints or concerns Comments Medications reviewed with patient Verified contact number and pharmacy with patient Zackery Barefoot CMA  February 17, 2010 11:09 AM    Physical Exam  Additional Exam:  Gen. Pleasant, well-nourished, in no distress, normal affect ENT - no lesions, no post nasal drip Neck: No JVD, no thyromegaly, no carotid bruits Lungs: no use of accessory muscles,  no dullness to percussion, clear without rales or rhonchi  Cardiovascular: Rhythm regular, heart sounds  normal, no murmurs or gallops, no peripheral edema Musculoskeletal: No deformities, no cyanosis or clubbing      Impression & Recommendations:  Problem # 1:  COUGH (ICD-786.2)  Favor combination of upper airway cough & GERD here. Cough syrup at bedtime as needed  Continue zegerid  Orders: Est. 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 Patient Level III (32440)  Medications Added to Medication List This Visit: 1)  Claritin-d 12 Hour 5-120 Mg Xr12h-tab (Loratadine-pseudoephedrine) .... Take 1 tablet by mouth once a day as needed 2)  Diovan/hctz  .... Take 1 tablet by mouth once a day pt unsure of strength  Other Orders: Pneumococcal Vaccine (10272) Admin 1st Vaccine (53664) Influenza Vaccine MCR (40347)  Patient Instructions: 1)  Please schedule a follow-up appointment in 6 months after CT scan   Immunizations Administered:  Pneumonia Vaccine:    Vaccine Type: Pneumovax (Medicare)    Site: left deltoid    Mfr: Merck    Dose: 0.5 ml    Route: IM    Given by: Zackery Barefoot CMA    Exp. Date: 08/14/2011    Lot #: [id number]  Influenza Vaccine # 1:    Vaccine Type: Fluvax MCR    Site: right deltoid    Mfr: GlaxoSmithKline    Dose: 0.5 ml    Route: IM    Given by: Zackery Barefoot CMA    Exp. Date: 11/29/2010    Lot #: [id number]    VIS given: 12/24/09 version given February 17, 2010.  Flu Vaccine Consent Questions:    Do you have a history of severe allergic reactions to this vaccine? no    Any prior history of allergic reactions to egg and/or gelatin? no    Do you have a sensitivity to the preservative Thimersol? no     Do you have a past history of Guillan-Barre Syndrome? no    Do you currently have an acute febrile illness? no    Have you ever had a severe reaction to latex? no    Vaccine information given and explained to patient? yes    Are you currently pregnant? no

## 2010-07-01 NOTE — Assessment & Plan Note (Signed)
Summary: NECK PAIN  DR MEN PT--STC   Vital Signs:  Patient profile:   70 year old female Height:      65 inches Weight:      137.75 pounds O2 Sat:      97 % Temp:     98.3 degrees F oral Pulse rate:   77 / minute BP sitting:   140 / 70  (left arm) Cuff size:   regular  Vitals Entered By: Jarome Lamas (March 29, 2010 9:19 AM) CC: neck pain/pb   History of Present Illness: having problems with neck Pain along right side that then goes up to her head Started about 3 days ago  No known injury Does sleep on 3 pillows but no change in this (for GERD)  Tried tylenol and naproxen--this helped the head pain but not the neck   Current Medications (verified): 1)  Zegerid 40-1100 Mg Caps (Omeprazole-Sodium Bicarbonate) .... Take 1 Tablet By Mouth Once A Day 2)  Tylenol Extra Strength 500 Mg Tabs (Acetaminophen) .... As Needed 3)  Claritin-D 12 Hour 5-120 Mg Xr12h-Tab (Loratadine-Pseudoephedrine) .... Take 1 Tablet By Mouth Once A Day As Needed 4)  Naproxen 500 Mg Tabs (Naproxen) .... Take 1 Tablet By Mouth Two Times A Day As Needed 5)  Exforge Hct 10-320-25 Mg Tabs (Amlodipine-Valsartan-Hctz) .Marland Kitchen.. 1 Tablet Daily  Allergies (verified): 1)  ! Pcn  Past History:  Past medical, surgical, family and social histories (including risk factors) reviewed for relevance to current acute and chronic problems.  Past Medical History: Reviewed history from 03/06/2010 and no changes required. 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 Kinnie Scales          Pul - Dr. Vassie Loll          ortho - Dr. Pollie Meyer      Past Surgical History: Reviewed history from 03/06/2010 and no changes required. * Hx  of COLON SURGERY- Surgical repair peptic ULCER TUBAL LIGATION, HX OF (ICD-V26.51)  G2Ps  Family History: Reviewed history from 03/06/2010 and no changes required. Father - deceased @79 : Colon  Cancer Mother- deceased @ 57: CVA, HTN, CAD/MI Neg- prostate or breast cancer; DM  Social History: Reviewed history from 03/06/2010 and no changes required. HSG Married - '62- 2 dtrs - '65, '70; grandchildren 3 Occupation: Psychologist, sport and exercise Marriage End-of-Life: No CPR, no intubation, no heroic or futile measures. (provided End of life packet)  Review of Systems       No fever no URI symptoms  Physical Exam  General:  alert and normal appearance.   Mouth:  no erythema and no exudates.   Neck:  mild tenderness along right trapezius Mild pain with extension and rotation and tilt to the right No nodes or masses   Impression & Recommendations:  Problem # 1:  NECK SPRAIN AND STRAIN (ICD-847.0) Assessment New clearly a trapezius sprain Could be related to sleeping on 3 pillows but this isn't new for her  will continue heat and naproxen has done well with flexeril in past---will give this just for night to help sleep which has been affected  Her updated medication list for this problem includes:    Tylenol Extra Strength 500 Mg Tabs (Acetaminophen) .Marland Kitchen... As needed  Naproxen 500 Mg Tabs (Naproxen) .Marland Kitchen... Take 1 tablet by mouth two times a day as needed    Cyclobenzaprine Hcl 5 Mg Tabs (Cyclobenzaprine hcl) .Marland Kitchen... 1-2 tabs at bedtime as needed for muscle spasm  Complete Medication List: 1)  Zegerid 40-1100 Mg Caps (Omeprazole-sodium bicarbonate) .... Take 1 tablet by mouth once a day 2)  Tylenol Extra Strength 500 Mg Tabs (Acetaminophen) .... As needed 3)  Claritin-d 12 Hour 5-120 Mg Xr12h-tab (Loratadine-pseudoephedrine) .... Take 1 tablet by mouth once a day as needed 4)  Naproxen 500 Mg Tabs (Naproxen) .... Take 1 tablet by mouth two times a day as needed 5)  Exforge Hct  10-320-25 Mg Tabs (Amlodipine-valsartan-hctz) .Marland Kitchen.. 1 tablet daily 6)  Cyclobenzaprine Hcl 5 Mg Tabs (Cyclobenzaprine hcl) .Marland Kitchen.. 1-2 tabs at bedtime as needed for muscle spasm  Patient Instructions: 1)  Please continue heat and naproxen for your neck 2)  Try the cyclobenzaprine at bedtime 3)  Please schedule a follow-up appointment as needed .  Prescriptions: CYCLOBENZAPRINE HCL 5 MG TABS (CYCLOBENZAPRINE HCL) 1-2 tabs at bedtime as needed for muscle spasm  #30 x 0   Entered and Authorized by:   Cindee Salt MD   Signed by:   Cindee Salt MD on 03/29/2010   Method used:   Electronically to        [facility] (retail)       [address]       [place], [place]  [id number]       Ph: [phone]       Fax: [phone]   RxID:   [id number]    Orders Added: 1)  Est. Patient Level III [27253]

## 2010-07-02 ENCOUNTER — Encounter: Payer: Self-pay | Admitting: Internal Medicine

## 2010-07-03 NOTE — Procedures (Signed)
Summary: Endoscopy / [facility]  Endoscopy / [facility]   Imported By: Lennie Odor 06/03/2010 14:11:45  _____________________________________________________________________  External Attachment:    Type:   Image     Comment:   External Document

## 2010-07-03 NOTE — Letter (Signed)
[facility]   Imported By: Sherian Rein 06/17/2010 10:13:46  _____________________________________________________________________  External Attachment:    Type:   Image     Comment:   External Document

## 2010-07-09 NOTE — Letter (Signed)
Summary: [facility]   Imported By: Sherian Rein 07/03/2010 09:38:57  _____________________________________________________________________  External Attachment:    Type:   Image     Comment:   External Document

## 2010-07-15 ENCOUNTER — Ambulatory Visit (INDEPENDENT_AMBULATORY_CARE_PROVIDER_SITE_OTHER): Payer: Medicare Other | Admitting: Internal Medicine

## 2010-07-15 ENCOUNTER — Encounter: Payer: Self-pay | Admitting: Internal Medicine

## 2010-07-15 DIAGNOSIS — K219 Gastro-esophageal reflux disease without esophagitis: Secondary | ICD-10-CM

## 2010-07-15 DIAGNOSIS — I10 Essential (primary) hypertension: Secondary | ICD-10-CM

## 2010-07-23 NOTE — Assessment & Plan Note (Signed)
Summary: off bp med x2 wks,?what kind to take   Vital Signs:  Patient profile:   70 year old female Height:      65 inches Weight:      133 pounds BMI:     22.21 O2 Sat:      97 % on Room air Temp:     98.5 degrees F oral Pulse rate:   104 / minute BP sitting:   158 / 90  (left arm) Cuff size:   regular  Vitals Entered By: [name] (July 15, 2010 11:35 AM)  O2 Flow:  Room air CC: off BP meds x 2 weeks/ ab   Primary Care Provider:  [name], MD  CC:  off BP meds x 2 weeks/ ab.  History of Present Illness: Mrs. Ohmer was last seen in early october '11 for a CPX. In the interval she was seen Oct 29th by Dr. Alphonsus Sias for neck pain and was started on naprosyn. Following tht she developed increased abdominal pain. She saw Dr. Kinnie Scales Nov 11th and was continued on zegeride and told to stop the NSAID. She came to EGD Dec 22nd where she was found to have a large bezoar. She was started on cola qid and reglan. On return visit Dec 28th she had early complications from reglan and was taken off reglan and started on zofran. She continued to have trouble and Jan 18th Dr. Kinnie Scales recommended stopping Exforge/Hct as possible offending agent. She reports the bloating stopped thereafter but she continues to have left abdominal pain.Also, her BP is running high.   Current Medications (verified): 1)  Zegerid 40-1100 Mg Caps (Omeprazole-Sodium Bicarbonate) .... Take 1 Tablet By Mouth Once A Day 2)  Claritin-D 12 Hour 5-120 Mg Xr12h-Tab (Loratadine-Pseudoephedrine) .... Take 1 Tablet By Mouth Once A Day As Needed 3)  Naproxen 500 Mg Tabs (Naproxen) .... Take 1 Tablet By Mouth Two Times A Day As Needed 4)  Exforge Hct 10-320-25 Mg Tabs (Amlodipine-Valsartan-Hctz) .Marland Kitchen.. 1 Tablet Daily 5)  Cyclobenzaprine Hcl 5 Mg Tabs (Cyclobenzaprine Hcl) .Marland Kitchen.. 1-2 Tabs At Bedtime As Needed For Muscle Spasm 6)  Tramadol Hcl 50 Mg Tabs (Tramadol Hcl) .Marland Kitchen.. 1 Every 6 Hours As Needed 7)  Ondansetron Hcl 4 Mg Tabs  (Ondansetron Hcl) .Marland Kitchen.. 1 Every 6 Hours As Needed  Allergies (verified): 1)  ! 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 Kinnie Scales          Pul - Dr. Vassie Loll          ortho - Dr. Pollie Meyer      Past Surgical History: Last updated: Mar 13, 2010 * Hx of COLON SURGERY- Surgical repair peptic ULCER TUBAL LIGATION, HX OF (ICD-V26.51)  G2Ps  Family History: Last updated: 03/13/10 Father - deceased @79 : Colon  Cancer Mother- deceased @ 31: CVA, HTN, CAD/MI Neg- prostate or breast cancer; DM  Social History: Last updated: 03-13-2010 HSG Married - '62- 2 dtrs - '65, '70; grandchildren 3 Occupation: Psychologist, sport and exercise Marriage End-of-Life: No CPR, no intubation, no heroic or futile measures. (provided End of life packet)  Review of Systems       The patient complains of weight loss and abdominal  pain.  The patient denies anorexia, fever, decreased hearing, chest pain, syncope, peripheral edema, headaches, severe indigestion/heartburn, muscle weakness, difficulty walking, depression, and abnormal bleeding.    Physical Exam  General:  alert, well-developed, well-nourished, and normal appearance.   Head:  normocephalic and atraumatic.   Eyes:  vision grossly intact and pupils equal.   Lungs:  normal respiratory effort and normal breath sounds.   Heart:  normal rate, regular rhythm, and no gallop.   Abdomen:  soft, normal bowel sounds, no masses, and no guarding.  Tender in the left upper quadrant Msk:  normal ROM.   Pulses:  2+ radial Extremities:  No clubbing, cyanosis, edema, or deformity noted with normal full  range of motion of all joints.   Neurologic:  alert & oriented X3, cranial nerves II-XII intact, strength normal in all extremities, sensation intact to light touch, and gait normal.   Skin:  turgor normal and color normal.   Psych:  normally interactive, good eye contact, and not anxious appearing.     Impression & Recommendations:  Problem # 1:  G E REFLUX (ICD-530.81) patient with a h/o bezoar who continues to have abdominal discomfort and early satiety.  Plan - follow-up with Dr. Kinnie Scales  Her updated medication list for this problem includes:    Zegerid 40-1100 Mg Caps (Omeprazole-sodium bicarbonate) .Marland Kitchen... Take 1 tablet by mouth once a day  Problem # 2:  HYPERTENSION (ICD-401.9) Patient with elevated blood pressure while off medication. In the past she had a lot of peripheral edema with norvasc. We discussed the need to add back a single medication at a time in order to track any potential side effects  Plan - losartan 100mg  once a day.           patient can monitor BP at home.  Her updated medication list for this problem includes:    Exforge Hct 10-320-25 Mg Tabs (Amlodipine-valsartan-hctz) .Marland Kitchen... 1 tablet daily    Losartan Potassium 100 Mg Tabs (Losartan potassium) .Marland Kitchen... 1 by mouth once daily  Complete Medication List: 1)  Zegerid 40-1100 Mg Caps (Omeprazole-sodium bicarbonate) .... Take 1 tablet by mouth once a day 2)  Claritin-d 12 Hour 5-120 Mg Xr12h-tab (Loratadine-pseudoephedrine) .... Take 1 tablet by mouth once a day as needed 3)  Naproxen 500 Mg Tabs (Naproxen) .... Take 1 tablet by mouth two times a day as needed 4)  Exforge Hct 10-320-25 Mg Tabs (Amlodipine-valsartan-hctz) .Marland Kitchen.. 1 tablet daily 5)  Cyclobenzaprine Hcl 5 Mg Tabs (Cyclobenzaprine hcl) .Marland Kitchen.. 1-2 tabs at bedtime as needed for muscle spasm 6)  Tramadol Hcl 50 Mg Tabs (Tramadol hcl) .Marland Kitchen.. 1 every 6 hours as needed 7)  Ondansetron Hcl 4 Mg Tabs (Ondansetron hcl) .Marland Kitchen.. 1 every 6 hours as needed 8)  Losartan  Potassium 100 Mg Tabs (Losartan potassium) .Marland Kitchen.. 1 by mouth once daily Prescriptions: TRAMADOL HCL 50 MG TABS (TRAMADOL HCL) 1 every 6 hours as needed  #100 x 2   Entered and Authorized by:   Jacques Navy MD   Signed by:   Jacques Navy MD on 07/15/2010   Method used:   Electronically to        [facility] [address] (retail)       [address]       [place], [place]  [id number]       Ph: [phone]       Fax: [phone]   RxID:   [id number] LOSARTAN POTASSIUM 100 MG TABS (LOSARTAN POTASSIUM) 1  by mouth once daily  #30 x 12   Entered and Authorized by:   Jacques Navy MD   Signed by:   Jacques Navy MD on 07/15/2010   Method used:   Electronically to        [facility] [address] (retail)       [address]       [place], [place]  [id number]       Ph: [phone]       Fax: [phone]   RxID:   [id number]    Orders Added: 1)  Est. Patient Level III [42595]

## 2010-07-24 ENCOUNTER — Ambulatory Visit (INDEPENDENT_AMBULATORY_CARE_PROVIDER_SITE_OTHER)
Admission: RE | Admit: 2010-07-24 | Discharge: 2010-07-24 | Disposition: A | Discharge status: Home / Self Care | Payer: Medicare Other | Source: Ambulatory Visit | Attending: Pulmonary Disease | Admitting: Pulmonary Disease

## 2010-07-24 DIAGNOSIS — R911 Solitary pulmonary nodule: Secondary | ICD-10-CM

## 2010-07-24 DIAGNOSIS — J984 Other disorders of lung: Secondary | ICD-10-CM

## 2010-07-29 NOTE — Letter (Signed)
Summary: Griffith Citron MD  Griffith Citron MD   Imported By: Lester [place] 07/22/2010 07:20:06  _____________________________________________________________________  External Attachment:    Type:   Image     Comment:   External Document

## 2011-05-05 ENCOUNTER — Ambulatory Visit: Payer: Medicare Other | Admitting: Pulmonary Disease

## 2011-05-06 ENCOUNTER — Ambulatory Visit (INDEPENDENT_AMBULATORY_CARE_PROVIDER_SITE_OTHER): Payer: Medicare Other | Admitting: Internal Medicine

## 2011-05-06 ENCOUNTER — Encounter: Payer: Self-pay | Admitting: Internal Medicine

## 2011-05-06 VITALS — BP 210/110 | HR 107 | Temp 98.0°F | Wt 125.0 lb

## 2011-05-06 DIAGNOSIS — I10 Essential (primary) hypertension: Secondary | ICD-10-CM

## 2011-05-06 DIAGNOSIS — R059 Cough, unspecified: Secondary | ICD-10-CM

## 2011-05-06 DIAGNOSIS — R05 Cough: Secondary | ICD-10-CM

## 2011-05-06 MED ORDER — PROMETHAZINE-CODEINE 6.25-10 MG/5ML PO SYRP: 5.0000 mL | ORAL_SOLUTION | ORAL | Status: DC | PRN

## 2011-05-06 MED ORDER — PREDNISONE 10 MG PO TABS: 10.0000 mg | ORAL_TABLET | Freq: Every day | ORAL | Status: DC

## 2011-05-06 MED ORDER — BENZONATATE 100 MG PO CAPS: 100.0000 mg | ORAL_CAPSULE | Freq: Three times a day (TID) | ORAL | Status: DC | PRN

## 2011-05-06 NOTE — Progress Notes (Signed)
  Subjective:    Patient ID: Amy Dunn, female    DOB: 1940-10-16, 70 y.o.   MRN: [id number]  HPI Amy Dunn presents for persistent dry cough, hacky with a tickle in her throat that started with a Viral URI. She has tried otc antitussives without relief. NO fever, no SOB  Right arm discomfort for a week in the proximal region. After a week the pain resolved spontaneously. She is concerned for a circulatory problem.  Blood pressure 210/110 !  I have reviewed the patient's medical history in detail and updated the computerized patient record.    Review of Systems System review is negative for any constitutional, cardiac, pulmonary, GI or neuro symptoms or complaints other than as described in the HPI.     Objective:   Physical Exam Vitals reviewed - very high blood pressure WNWD AA woman in no distress HEENT- [place]/AT, C&S clear - fundi without hemorrhage or papilledema Neck - supple w/ full ROM Pulm - normal respiration w/o increased WOB, no rales or wheezes Cor - RRR       Assessment & Plan:  1. Cyclical cough -  Plan- tessalon perles, prednisone burst and taper, anti-tussive of choice  2. Paresthesia right UE - resolved. Suspect transient radicular pain. No further work-up unless she has recurrence - then C-spine films

## 2011-05-06 NOTE — Patient Instructions (Signed)
1. Cough - this is a cyclical cough: cough - tracheal irritation-cough-tracheal irritation, etc.  Plan - promethazine w/ codeine cough syrup 1 tsp every 6 hrs; tessalon perles 100 mg three times a day and prednisone -burst and taper as directed.  2. Arm tingling-pain: there is good circulation; no evidence of any joint problem. I suspect this was a transient inflammation of a nerve root in the neck. Seems to have resolved.  3. Blood pressure - too high. Plan - continue losartan once a day. Add bystolic 10mg  once a day. If this works I will change this to a generic beta-blocker. Please come by for a blood pressure check Monday.

## 2011-05-07 NOTE — Assessment & Plan Note (Signed)
Very high BP. She is asymptomatic and exam is normal.  Plan - Bystolic 10 mg daily (14 day sample provided) - if effective will change to generic beta-blocker           Monitor BP

## 2011-05-11 ENCOUNTER — Ambulatory Visit (INDEPENDENT_AMBULATORY_CARE_PROVIDER_SITE_OTHER): Payer: Medicare Other | Admitting: Internal Medicine

## 2011-05-11 ENCOUNTER — Other Ambulatory Visit: Payer: Self-pay | Admitting: Internal Medicine

## 2011-05-11 DIAGNOSIS — I10 Essential (primary) hypertension: Secondary | ICD-10-CM

## 2011-05-11 MED ORDER — FUROSEMIDE 20 MG PO TABS: 20.0000 mg | ORAL_TABLET | Freq: Every day | ORAL | Status: DC

## 2011-05-11 NOTE — Patient Instructions (Signed)
Blood pressure is too high today: 180/110 and may be the cause of headaches.  Plan- continue the Exforge          Add furosemide 20 mg once a day - this may cause an increase in urination. Please check your BP and let me know by the end of the week if the top number is greater than 150 or the bottom number is greater than 100. The goal is 140 or less/less than 90.

## 2011-05-12 NOTE — Progress Notes (Signed)
  Subjective:    Patient ID: Amy Dunn, female    DOB: 01/30/1941, 70 y.o.   MRN: [id number]  HPI Patient present for BP check and her BP is very high. She reports minor headache but no other symptoms. She has been taking her medication.  I have reviewed the patient's medical history in detail and updated the computerized patient record.    Review of Systems System review is negative for any constitutional, cardiac, pulmonary, GI or neuro symptoms or complaints other than as described in the HPI.     Objective:   Physical Exam Vitals - high BP noted. Repeat exam confirms elevation Gen'l- WNWD AA woman in no distress HEENT- PERRLA, fundiscopic exam - no hemorrhages, normal disk margins Cor- RRR       Assessment & Plan:

## 2011-05-12 NOTE — Assessment & Plan Note (Addendum)
BP Readings from Last 3 Encounters:  05/11/11 180/110  05/06/11 210/110  07/15/10 158/90  She is asymptomatic and exam is negative for signs of malignant hypertension  Medication: Exforge (amlodipine/valsartan) 10/320  Plan - will add diuretic - furosemide 20 mg daily           Recheck BP 3-4 days           For poor response will consider doppler or CT angio to r/o RAS

## 2011-05-21 ENCOUNTER — Telehealth: Payer: Self-pay | Admitting: *Deleted

## 2011-05-21 NOTE — Telephone Encounter (Signed)
Pt called to report BP-BP 139/90

## 2011-05-24 NOTE — Telephone Encounter (Signed)
Reading is improved to BP prior to furosemide. Plan - continue present regimen. Check BP from time to time and report back if consistently great than 140/90

## 2011-05-27 ENCOUNTER — Encounter: Payer: Self-pay | Admitting: Pulmonary Disease

## 2011-05-27 ENCOUNTER — Ambulatory Visit (INDEPENDENT_AMBULATORY_CARE_PROVIDER_SITE_OTHER): Payer: Medicare Other | Admitting: Pulmonary Disease

## 2011-05-27 VITALS — BP 126/86 | HR 73 | Temp 98.4°F | Ht 65.0 in | Wt 126.8 lb

## 2011-05-27 DIAGNOSIS — R059 Cough, unspecified: Secondary | ICD-10-CM

## 2011-05-27 DIAGNOSIS — R05 Cough: Secondary | ICD-10-CM

## 2011-05-27 DIAGNOSIS — J984 Other disorders of lung: Secondary | ICD-10-CM

## 2011-05-27 MED ORDER — BENZONATATE 200 MG PO CAPS: 200.0000 mg | ORAL_CAPSULE | Freq: Two times a day (BID) | ORAL | Status: AC

## 2011-05-27 MED ORDER — ALBUTEROL SULFATE (2.5 MG/3ML) 0.083% IN NEBU: 2.5000 mg | INHALATION_SOLUTION | Freq: Once | RESPIRATORY_TRACT | Status: DC

## 2011-05-27 NOTE — Assessment & Plan Note (Signed)
Get back on tessalon perles 200 mg thrice daily as needed Stay on ZEGErid x 6 weeks or  until cough resolved OK to take claritin or zyrtec daily

## 2011-05-27 NOTE — Telephone Encounter (Signed)
Left second message for pt to call me back

## 2011-05-27 NOTE — Patient Instructions (Signed)
Get back on tessalon perles 200 mg thrice daily as needed Stay on ZEGErid x 6 weeks or  until cough resolved OK to take claritin or zyrtec daily CT scan to follow up on nodule in feb '13

## 2011-05-27 NOTE — Assessment & Plan Note (Addendum)
  CT scan to follow up on nodule in feb '13

## 2011-05-27 NOTE — Progress Notes (Signed)
  Subjective:    Patient ID: Amy Dunn, female    DOB: 1940/11/09, 70 y.o.   MRN: [id number]  HPI PCP - Norins  70/F, ex smoker for FU of -chronic cough since 3/10 attributed to upper airway cough/ GERD  - LLL nodule on CT feb 2011 -stable in feb'12 , not seen on CT abd feb 2006  This started after a bad cold that was treated with codeine cough syrup. Se has seen Dr Lottie Mussel for GERD & was put on zegerid - She has been taking the reduced dose generic version, with frequent breakthrough symptoms. Cough is worse at night, occasional white phlegm, tylenol cold medications seem to help. She smoked fro 25 Pyrs before quitting in 1980.  Spirometry showed flattening of expiratory limb but no evidence of airway obstruction.  Cough not much changed with trial of 1st gen anti-H, back on claritin D. Did not respond to trial of reglan. Cough was improved on regimen of nasal steroid, zegerid & claritin daily x 1 month  cough controlled on codeine , comes back if she stops.  3/11 allergy panel nml.   Sep, 2011  Occasional cough twice/ week - resolve with codeine. stopped claritin D, continues zegerid. CT chest unchanged . Needs medical MD   05/27/2011 COugh is back - c/o cough x 1 month. Patient states cough is better since it first started. Patient denies sob, wheezing, chest pain, and chest tightness.   Ct feb '12 showed unchanged nodule GERD- better on zegerid- takes 14 days then stops Tessalon & codeine syrup helped Denies heartburn, diurnal or seasonal variation.    Review of Systems Pt denies any significant  nasal congestion or excess secretions, fever, chills, sweats, unintended wt loss, pleuritic or exertional cp, orthopnea pnd or leg swelling.  Pt also denies any obvious fluctuation in symptoms with weather or environmental change or other alleviating or aggravating factors.    Pt denies any increase in rescue therapy over baseline, denies waking up needing it or having early am  exacerbations or coughing/wheezing/ or dyspnea      Objective:   Physical Exam Gen. Pleasant, well-nourished, in no distress ENT - no lesions, no post nasal drip Neck: No JVD, no thyromegaly, no carotid bruits Lungs: no use of accessory muscles, no dullness to percussion, clear without rales or rhonchi  Cardiovascular: Rhythm regular, heart sounds  normal, no murmurs or gallops, no peripheral edema Musculoskeletal: No deformities, no cyanosis or clubbing         Assessment & Plan:

## 2011-05-28 NOTE — Telephone Encounter (Signed)
Informed pt that she needs to continue her furosemide and check BP from time to time. Informed pt to call office if BP consistently runs over 140/90, pt states she will keep check on BP and call prn

## 2011-07-27 ENCOUNTER — Other Ambulatory Visit: Payer: Medicare Other

## 2011-07-28 ENCOUNTER — Ambulatory Visit (INDEPENDENT_AMBULATORY_CARE_PROVIDER_SITE_OTHER)
Admission: RE | Admit: 2011-07-28 | Discharge: 2011-07-28 | Disposition: A | Discharge status: Home / Self Care | Payer: Medicare Other | Source: Ambulatory Visit | Attending: Pulmonary Disease | Admitting: Pulmonary Disease

## 2011-07-28 DIAGNOSIS — J984 Other disorders of lung: Secondary | ICD-10-CM

## 2011-07-29 ENCOUNTER — Other Ambulatory Visit: Payer: Medicare Other

## 2011-08-06 ENCOUNTER — Other Ambulatory Visit: Payer: Self-pay | Admitting: Internal Medicine

## 2011-08-18 ENCOUNTER — Ambulatory Visit (INDEPENDENT_AMBULATORY_CARE_PROVIDER_SITE_OTHER): Payer: Medicare Other | Admitting: Internal Medicine

## 2011-08-18 ENCOUNTER — Other Ambulatory Visit (INDEPENDENT_AMBULATORY_CARE_PROVIDER_SITE_OTHER): Payer: Medicare Other

## 2011-08-18 ENCOUNTER — Encounter: Payer: Self-pay | Admitting: Internal Medicine

## 2011-08-18 VITALS — BP 162/98 | HR 88 | Temp 98.0°F | Resp 14 | Wt 122.8 lb

## 2011-08-18 DIAGNOSIS — R109 Unspecified abdominal pain: Secondary | ICD-10-CM

## 2011-08-18 DIAGNOSIS — R1012 Left upper quadrant pain: Secondary | ICD-10-CM

## 2011-08-18 DIAGNOSIS — G8929 Other chronic pain: Secondary | ICD-10-CM

## 2011-08-18 LAB — URINALYSIS, ROUTINE W REFLEX MICROSCOPIC
Bilirubin Urine: NEGATIVE
Hgb urine dipstick: NEGATIVE
Ketones, ur: NEGATIVE
Total Protein, Urine: NEGATIVE
pH: 5.5 (ref 5.0–8.0)

## 2011-08-18 NOTE — Progress Notes (Signed)
  Subjective:    Patient ID: Amy Dunn, female    DOB: Mar 08, 1941, 71 y.o.   MRN: [id number]  HPI Amy Dunn presents with a month or more of left lower quadrant abdominal pain. No fever, no chills. No dysuria. No blood in the urine. A little more frequent. The pain is constant, 4/10 sometimes a little worse. No h/o injury. She did have some left over antibiotics that she took that she thinks helped.   Chart reviewed: Abdominal U/S Dec '02 - small renal cysts - 7 mm left                           CT abd/pelvis Jan '12 - renal cysts, normal spleen, no adenopathy                           CT Chest Feb '13 - incidental finding of left renal cyst.  Past Medical History  Diagnosis Date  . History of colonic polyps   . Acute peptic ulcer, unspecified site, with hemorrhage and perforation, without mention of obstruction   . Osteoarthritis of left shoulder   . Primary osteoarthritis of left hip   . Pulmonary nodule, left     Left Lower lobe  . GERD (gastroesophageal reflux disease)   . Cough   . Hypertension    Past Surgical History  Procedure Date  . Colon surgery   . Surgical repair of peptic ulcer    Family History  Problem Relation Age of Onset  . Hypertension Mother   . Heart disease Mother   . Heart attack Mother   . Cancer Father     Colon cancer   History   Social History  . Marital Status: Married    Spouse Name: N/A    Number of Children: N/A  . Years of Education: N/A   Occupational History  . Not on file.   Social History Main Topics  . Smoking status: Former Smoker -- 1 years    Types: Cigarettes    Quit date: 05/26/1981  . Smokeless tobacco: Never Used   Comment: 2 ciggs per day  . Alcohol Use: No  . Drug Use: No  . Sexually Active: Not on file   Other Topics Concern  . Not on file   Social History Narrative   HSGMarried- '622 daughters- '65, '70; 3 grandchildrenOccupation retired- mfg inspectorEnd of life: No CPR, no intubation, no heroic measures  (pt provided end of life packet)       Review of Systems System review is negative for any constitutional, cardiac, pulmonary, GI or neuro symptoms or complaints other than as described in the HPI.     Objective:   Physical Exam Filed Vitals:   08/18/11 1519  BP: 162/98  Pulse: 88  Temp: 98 F (36.7 C)  Resp: 14   Wt Readings from Last 3 Encounters:  08/18/11 122 lb 12 oz (55.679 kg)  05/27/11 126 lb 12.8 oz (57.516 kg)  05/06/11 125 lb (56.7 kg)   Gen'l- slender AA woman in no distress Cor- 2+ radial , RRR Pulm - normal respirations Abd - BS + x 4, no HSM, no guarding or rebound. No masses. Tender to percussion and deep palpation left quadrant at level of umbilicus.         Assessment & Plan:

## 2011-08-19 DIAGNOSIS — R109 Unspecified abdominal pain: Secondary | ICD-10-CM | POA: Insufficient documentation

## 2011-08-19 DIAGNOSIS — G8929 Other chronic pain: Secondary | ICD-10-CM | POA: Insufficient documentation

## 2011-08-19 LAB — COMPREHENSIVE METABOLIC PANEL
ALT: 11 U/L (ref 0–35)
Albumin: 4 g/dL (ref 3.5–5.2)
CO2: 28 mEq/L (ref 19–32)
Calcium: 9 mg/dL (ref 8.4–10.5)
Chloride: 100 mEq/L (ref 96–112)
GFR: 73.3 mL/min (ref >60.00)
Glucose, Bld: 83 mg/dL (ref 70–99)
Sodium: 136 mEq/L (ref 135–145)
Total Protein: 7.4 g/dL (ref 6.0–8.3)

## 2011-08-19 NOTE — Assessment & Plan Note (Signed)
Patient with persistent left flank/left mid-abdominal pain. No evidence of infection: fever, chills, dysuria, hematuria, urgency or frequency. She has imaging evidence of renal cysts. No prior history of nephrolithiasis.  Plan - U/A - r/o microhematuria           Bmet - assess renal function           Abdominal u/s with comparison to prior study of '02 for evaluation of potentially progressive cystic disease vs neoplasm.

## 2011-08-20 ENCOUNTER — Inpatient Hospital Stay
Admission: RE | Admit: 2011-08-20 | Discharge: 2011-08-20 | Payer: Medicare Other | Source: Ambulatory Visit | Attending: Internal Medicine | Admitting: Internal Medicine

## 2011-08-20 ENCOUNTER — Telehealth: Payer: Self-pay | Admitting: *Deleted

## 2011-08-20 ENCOUNTER — Other Ambulatory Visit: Payer: Self-pay | Admitting: Internal Medicine

## 2011-08-20 ENCOUNTER — Ambulatory Visit
Admission: RE | Admit: 2011-08-20 | Discharge: 2011-08-20 | Disposition: A | Discharge status: Home / Self Care | Payer: Medicare Other | Source: Ambulatory Visit | Attending: Internal Medicine | Admitting: Internal Medicine

## 2011-08-20 DIAGNOSIS — R1012 Left upper quadrant pain: Secondary | ICD-10-CM

## 2011-08-20 DIAGNOSIS — N2889 Other specified disorders of kidney and ureter: Secondary | ICD-10-CM

## 2011-08-20 NOTE — Telephone Encounter (Signed)
Pt's mobile number disconnected, home phone- unable to leave message.

## 2011-08-20 NOTE — Telephone Encounter (Signed)
Call patient: u/s raises question of mild dilation of kidneys. Radiology recommends f/u CT - will have this scheduled.

## 2011-08-20 NOTE — Telephone Encounter (Signed)
Call Report from [facility] Imaging: US Abdomen: 1. Dilated common bile duct with apparent distal common bile duct  calculi. Also prominent pancreatic duct without evidence of mass.  Correlate with liver function tests.  2. Inhomogeneous liver may indicate fatty infiltration. No  definite gallstones are seen.  3. Slight dilatation of both renal collecting systems. Consider  further assessment with CT of the abdomen and pelvis to exclude a  distal cause of this fullness.  4. Somewhat echogenic renal parenchyma bilaterally may indicate  chronic renal medical disease. Correlate with renal laboratory  values.

## 2011-08-23 ENCOUNTER — Encounter: Payer: Self-pay | Admitting: Internal Medicine

## 2011-08-23 NOTE — Progress Notes (Unsigned)
Reviewed labs and U/S and prior CT abdomen. U/A negative, Cmet normal. Mild dilation of collecting system but normal CT pelvis Jan '12.   Plan - 1. Evaluate CBD calculi and dilation as possible source of pain - refer to Dr. Kinnie Scales           2. Renal function normal - will defer further evaluation at this time with normal               BUN/Cr.

## 2011-08-25 NOTE — Telephone Encounter (Signed)
Pt aware.

## 2011-08-26 ENCOUNTER — Ambulatory Visit (INDEPENDENT_AMBULATORY_CARE_PROVIDER_SITE_OTHER)
Admission: RE | Admit: 2011-08-26 | Discharge: 2011-08-26 | Disposition: A | Discharge status: Home / Self Care | Payer: Medicare Other | Source: Ambulatory Visit | Attending: Internal Medicine | Admitting: Internal Medicine

## 2011-08-26 DIAGNOSIS — N2889 Other specified disorders of kidney and ureter: Secondary | ICD-10-CM

## 2011-08-26 MED ORDER — IOHEXOL 300 MG/ML  SOLN
100.0000 mL | Freq: Once | INTRAMUSCULAR | Status: AC | PRN
  Administered 2011-08-26: 100 mL via INTRAVENOUS

## 2011-08-30 ENCOUNTER — Encounter: Payer: Self-pay | Admitting: Internal Medicine

## 2011-09-09 ENCOUNTER — Ambulatory Visit (INDEPENDENT_AMBULATORY_CARE_PROVIDER_SITE_OTHER): Payer: Medicare Other | Admitting: Pulmonary Disease

## 2011-09-09 ENCOUNTER — Encounter: Payer: Self-pay | Admitting: Pulmonary Disease

## 2011-09-09 VITALS — BP 162/98 | HR 81 | Temp 98.2°F | Ht 65.0 in | Wt 123.6 lb

## 2011-09-09 DIAGNOSIS — J984 Other disorders of lung: Secondary | ICD-10-CM

## 2011-09-09 DIAGNOSIS — K219 Gastro-esophageal reflux disease without esophagitis: Secondary | ICD-10-CM

## 2011-09-09 DIAGNOSIS — R05 Cough: Secondary | ICD-10-CM

## 2011-09-09 DIAGNOSIS — R059 Cough, unspecified: Secondary | ICD-10-CM

## 2011-09-09 NOTE — Assessment & Plan Note (Signed)
Intermittent since 3/10 attributed to GERD/upper airway cough Use zegerid if cough comes back Discuss with dr Kinnie Scales - your food pipe (esophagus) is dilated at the lower end - this may be the cause of your reflux and cough

## 2011-09-09 NOTE — Assessment & Plan Note (Signed)
GI evaln - medoff

## 2011-09-09 NOTE — Patient Instructions (Addendum)
CT scan in 1 year Use zegerid if cough comes back Discuss with dr Kinnie Scales - your food pipe (esophagus) is dilated at the lower end - this may be the cause of your reflux and cough

## 2011-09-09 NOTE — Assessment & Plan Note (Signed)
Stable from feb '11 to 2/13 Not seen in 2006  Probably benign, but in this smoker probably annual fu to 5 yrs recommended

## 2011-09-09 NOTE — Progress Notes (Signed)
  Subjective:    Patient ID: Amy Dunn, female    DOB: 1940/07/01, 71 y.o.   MRN: [id number]  HPI PCP - Norins  70/F, ex smoker for FU of -chronic cough since 3/10 attributed to upper airway cough/ GERD  - LLL nodule on CT feb 2011 -stable in feb'12 , not seen on CT abd feb 2006  This started after a bad cold that was treated with codeine cough syrup. Se has seen Dr Lottie Mussel for GERD & was put on zegerid - She has been taking the reduced dose generic version, with frequent breakthrough symptoms. Cough is worse at night, occasional white phlegm, tylenol cold medications seem to help. She smoked fro 25 Pyrs before quitting in 1980.  Spirometry showed flattening of expiratory limb but no evidence of airway obstruction.  Cough not much changed with trial of 1st gen anti-H, back on claritin D. Did not respond to trial of reglan. Cough was improved on regimen of nasal steroid, zegerid & claritin daily x 1 month  cough controlled on codeine , comes back if she stops.  3/11 allergy panel nml.  Sep, 2011  Occasional cough twice/ week - resolve with codeine. stopped claritin D, continues zegerid. CT chest unchanged . Needs medical MD   05/27/2011  COugh is back - c/o cough x 1 month. Patient states cough is better since it first started. Patient denies sob, wheezing, chest pain, and chest tightness.  Ct feb '12 showed unchanged nodule  GERD- better on zegerid- takes 14 days then stops  Tessalon & codeine syrup helped >>> stay on zegerid  09/09/2011 Ct chet showed stable LLL nodule   CT abdomen for abd-al pain  3/13  Showed -Air and fluid within dilated distal esophagus. This can be seen in the setting of gastroesophageal reflux or distal esophageal stricture  Denies heartburn, diurnal or seasonal variation.    Review of Systems     Objective:   Physical Exam        Assessment & Plan:

## 2011-09-10 ENCOUNTER — Ambulatory Visit: Payer: Medicare Other | Admitting: Pulmonary Disease

## 2011-09-17 ENCOUNTER — Encounter (INDEPENDENT_AMBULATORY_CARE_PROVIDER_SITE_OTHER): Payer: Medicare Other | Admitting: Ophthalmology

## 2011-09-17 DIAGNOSIS — I10 Essential (primary) hypertension: Secondary | ICD-10-CM

## 2011-09-17 DIAGNOSIS — H35039 Hypertensive retinopathy, unspecified eye: Secondary | ICD-10-CM

## 2011-09-17 DIAGNOSIS — H348392 Tributary (branch) retinal vein occlusion, unspecified eye, stable: Secondary | ICD-10-CM

## 2011-09-17 DIAGNOSIS — H2510 Age-related nuclear cataract, unspecified eye: Secondary | ICD-10-CM

## 2011-09-17 DIAGNOSIS — H43819 Vitreous degeneration, unspecified eye: Secondary | ICD-10-CM

## 2011-09-21 ENCOUNTER — Ambulatory Visit (INDEPENDENT_AMBULATORY_CARE_PROVIDER_SITE_OTHER): Payer: Medicare Other | Admitting: Ophthalmology

## 2011-09-21 DIAGNOSIS — H35039 Hypertensive retinopathy, unspecified eye: Secondary | ICD-10-CM

## 2011-09-21 DIAGNOSIS — H43819 Vitreous degeneration, unspecified eye: Secondary | ICD-10-CM

## 2011-09-21 DIAGNOSIS — I10 Essential (primary) hypertension: Secondary | ICD-10-CM

## 2011-09-21 DIAGNOSIS — H348392 Tributary (branch) retinal vein occlusion, unspecified eye, stable: Secondary | ICD-10-CM

## 2011-10-15 ENCOUNTER — Encounter (INDEPENDENT_AMBULATORY_CARE_PROVIDER_SITE_OTHER): Payer: Medicare Other | Admitting: Ophthalmology

## 2011-10-15 DIAGNOSIS — H2510 Age-related nuclear cataract, unspecified eye: Secondary | ICD-10-CM

## 2011-10-15 DIAGNOSIS — I10 Essential (primary) hypertension: Secondary | ICD-10-CM

## 2011-10-15 DIAGNOSIS — H35039 Hypertensive retinopathy, unspecified eye: Secondary | ICD-10-CM

## 2011-10-15 DIAGNOSIS — H348392 Tributary (branch) retinal vein occlusion, unspecified eye, stable: Secondary | ICD-10-CM

## 2011-10-15 DIAGNOSIS — H43819 Vitreous degeneration, unspecified eye: Secondary | ICD-10-CM

## 2011-10-22 ENCOUNTER — Ambulatory Visit (INDEPENDENT_AMBULATORY_CARE_PROVIDER_SITE_OTHER): Payer: Medicare Other | Admitting: Internal Medicine

## 2011-10-22 ENCOUNTER — Other Ambulatory Visit (INDEPENDENT_AMBULATORY_CARE_PROVIDER_SITE_OTHER): Payer: Medicare Other

## 2011-10-22 ENCOUNTER — Encounter: Payer: Self-pay | Admitting: Internal Medicine

## 2011-10-22 VITALS — BP 168/112 | HR 80 | Temp 97.8°F | Resp 16 | Wt 120.0 lb

## 2011-10-22 DIAGNOSIS — I10 Essential (primary) hypertension: Secondary | ICD-10-CM

## 2011-10-22 LAB — COMPREHENSIVE METABOLIC PANEL
ALT: 12 U/L (ref 0–35)
AST: 17 U/L (ref 0–37)
Calcium: 9.1 mg/dL (ref 8.4–10.5)
Chloride: 101 mEq/L (ref 96–112)
Creatinine, Ser: 1.1 mg/dL (ref 0.4–1.2)
Sodium: 139 mEq/L (ref 135–145)
Total Protein: 7.2 g/dL (ref 6.0–8.3)

## 2011-10-22 LAB — CBC WITH DIFFERENTIAL/PLATELET
Basophils Absolute: 0 10*3/uL (ref 0.0–0.1)
Eosinophils Absolute: 0.1 10*3/uL (ref 0.0–0.7)
HCT: 34.8 % — ABNORMAL LOW (ref 36.0–46.0)
Hemoglobin: 11.5 g/dL — ABNORMAL LOW (ref 12.0–15.0)
Lymphocytes Relative: 30.4 % (ref 12.0–46.0)
Lymphs Abs: 1.8 10*3/uL (ref 0.7–4.0)
MCHC: 33.1 g/dL (ref 30.0–36.0)
Neutro Abs: 3.4 10*3/uL (ref 1.4–7.7)
RDW: 14.3 % (ref 11.5–14.6)

## 2011-10-22 LAB — URINALYSIS, ROUTINE W REFLEX MICROSCOPIC
Bilirubin Urine: NEGATIVE
Hgb urine dipstick: NEGATIVE
Ketones, ur: NEGATIVE
Urobilinogen, UA: 0.2 (ref 0.0–1.0)

## 2011-10-22 LAB — TSH: TSH: 0.66 u[IU]/mL (ref 0.35–5.50)

## 2011-10-22 MED ORDER — NEBIVOLOL HCL 10 MG PO TABS: 10.0000 mg | ORAL_TABLET | Freq: Every day | ORAL | Status: DC

## 2011-10-22 MED ORDER — OLMESARTAN MEDOXOMIL-HCTZ 40-25 MG PO TABS: 1.0000 | ORAL_TABLET | Freq: Every day | ORAL | Status: DC

## 2011-10-22 NOTE — Patient Instructions (Signed)

## 2011-10-22 NOTE — Assessment & Plan Note (Signed)
I have changed her anti-hypertensive regimen to get better BP control, I will check her labs today to look for end organ damage to screen for secondary causes of hypertension

## 2011-10-22 NOTE — Progress Notes (Signed)
  Subjective:    Patient ID: Amy Dunn, female    DOB: January 31, 1941, 71 y.o.   MRN: [id number]  Hypertension This is a chronic problem. The current episode started more than 1 year ago. The problem has been gradually worsening since onset. The problem is uncontrolled. Associated symptoms include headaches. Pertinent negatives include no anxiety, blurred vision, chest pain, malaise/fatigue, neck pain, orthopnea, palpitations, peripheral edema, PND, shortness of breath or sweats. Past treatments include diuretics and angiotensin blockers. The current treatment provides mild improvement. There are no compliance problems.  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      Review of Systems  Constitutional: Negative for fever, chills, malaise/fatigue, diaphoresis, activity change, appetite change, fatigue and unexpected weight change.  HENT: Negative for neck pain.   Eyes: Positive for visual disturbance (right eye BV is improving). Negative for blurred vision, photophobia, pain, discharge, redness and itching.  Respiratory: Negative for cough, choking, chest tightness, shortness of breath, wheezing and stridor.   Cardiovascular: Negative for chest pain, palpitations, orthopnea and PND.  Gastrointestinal: Negative for nausea, vomiting, abdominal pain, diarrhea, constipation and anal bleeding.  Genitourinary: Negative for dysuria, urgency, hematuria, flank pain, decreased urine volume, enuresis, difficulty urinating and dyspareunia.  Musculoskeletal: Negative.   Skin: Negative for color change, pallor, rash and wound.  Neurological: Positive for headaches. Negative for dizziness, tremors, seizures, syncope, facial asymmetry, speech difficulty, weakness, light-headedness and numbness.  Hematological: Negative for adenopathy. Does not bruise/bleed easily.  Psychiatric/Behavioral: Negative.        Objective:   Physical Exam  Vitals reviewed. Constitutional: She  is oriented to person, place, and time. She appears well-developed and well-nourished. No distress.  HENT:  Head: Normocephalic and atraumatic.  Mouth/Throat: Oropharynx is clear and moist.  Eyes: Conjunctivae are normal. Right eye exhibits no discharge. Left eye exhibits no discharge. No scleral icterus.  Neck: Normal range of motion. Neck supple. No JVD present. No tracheal deviation present. No thyromegaly present.  Cardiovascular: Normal rate, regular rhythm, normal heart sounds and intact distal pulses.  Exam reveals no gallop and no friction rub.   No murmur heard. Pulmonary/Chest: Effort normal and breath sounds normal. No stridor. No respiratory distress. She has no wheezes. She has no rales. She exhibits no tenderness.  Abdominal: Soft. Bowel sounds are normal. She exhibits no distension and no mass. There is no tenderness. There is no rebound and no guarding.  Musculoskeletal: Normal range of motion. She exhibits no edema and no tenderness.  Lymphadenopathy:    She has no cervical adenopathy.  Neurological: She is oriented to person, place, and time.  Skin: Skin is warm and dry. No rash noted. She is not diaphoretic. No erythema. No pallor.  Psychiatric: She has a normal mood and affect. Her behavior is normal. Judgment and thought content normal.     Lab Results  Component Value Date   WBC 6.8 03/18/2010   HGB 11.5* 03/18/2010   HCT 32.9* 03/18/2010   PLT 176.0 03/18/2010   GLUCOSE 83 08/18/2011   CHOL 212* 03/18/2010   TRIG 281.0* 03/18/2010   HDL 41.80 03/18/2010   LDLDIRECT 100.8 03/18/2010   ALT 11 08/18/2011   AST 15 08/18/2011   NA 136 08/18/2011   K 4.7 08/18/2011   CL 100 08/18/2011   CREATININE 1.0 08/18/2011   BUN 13 08/18/2011   CO2 28 08/18/2011   TSH 1.04 03/18/2010       Assessment & Plan:

## 2011-11-11 ENCOUNTER — Encounter (INDEPENDENT_AMBULATORY_CARE_PROVIDER_SITE_OTHER): Payer: Medicare Other | Admitting: Ophthalmology

## 2011-11-11 DIAGNOSIS — I10 Essential (primary) hypertension: Secondary | ICD-10-CM

## 2011-11-11 DIAGNOSIS — H43819 Vitreous degeneration, unspecified eye: Secondary | ICD-10-CM

## 2011-11-11 DIAGNOSIS — H35039 Hypertensive retinopathy, unspecified eye: Secondary | ICD-10-CM

## 2011-11-11 DIAGNOSIS — H2510 Age-related nuclear cataract, unspecified eye: Secondary | ICD-10-CM

## 2011-11-11 DIAGNOSIS — H348392 Tributary (branch) retinal vein occlusion, unspecified eye, stable: Secondary | ICD-10-CM

## 2011-11-12 ENCOUNTER — Ambulatory Visit (INDEPENDENT_AMBULATORY_CARE_PROVIDER_SITE_OTHER): Payer: Medicare Other | Admitting: Internal Medicine

## 2011-11-12 ENCOUNTER — Encounter: Payer: Self-pay | Admitting: Internal Medicine

## 2011-11-12 VITALS — BP 114/80 | HR 60 | Temp 98.6°F | Resp 16 | Ht 65.0 in | Wt 121.0 lb

## 2011-11-12 DIAGNOSIS — I10 Essential (primary) hypertension: Secondary | ICD-10-CM

## 2011-11-12 NOTE — Progress Notes (Signed)
  Subjective:    Patient ID: Amy Dunn, female    DOB: January 28, 1941, 71 y.o.   MRN: [id number]  HPI Amy Dunn presents for follow-up of BP. In the interval since her visit she has had "stroke" in the right eye, hemorrhage. She was seen by Dr. Dione Booze who referred to Dr. Jerolyn Center. She has had continued treatment with Dr. Jerolyn Center and she has had several intraorbital injections. Her doctors thought this was a hypertensive hemorrhage. Her vision is better but not fully restored and she continues to see Dr. Jerolyn Center. She has seen Dr. Yetta Barre who has adjusted her medications: Benicar/Hct 40/25 and bystolic She has no headaches, focal neurologic symptoms and is feeling well.  Past Medical History  Diagnosis Date  . History of colonic polyps   . Acute peptic ulcer, unspecified site, with hemorrhage and perforation, without mention of obstruction   . Osteoarthritis of left shoulder   . Primary osteoarthritis of left hip   . Pulmonary nodule, left     Left Lower lobe  . GERD (gastroesophageal reflux disease)   . Cough   . Hypertension    Past Surgical History  Procedure Date  . Colon surgery   . Surgical repair of peptic ulcer    Family History  Problem Relation Age of Onset  . Hypertension Mother   . Heart disease Mother   . Heart attack Mother   . Cancer Father     Colon cancer   History   Social History  . Marital Status: Married    Spouse Name: N/A    Number of Children: N/A  . Years of Education: N/A   Occupational History  . Not on file.   Social History Main Topics  . Smoking status: Former Smoker -- 1 years    Types: Cigarettes    Quit date: 05/26/1981  . Smokeless tobacco: Never Used   Comment: 2 ciggs per day  . Alcohol Use: No  . Drug Use: No  . Sexually Active: Not on file   Other Topics Concern  . Not on file   Social History Narrative   HSGMarried- '622 daughters- '65, '70; 3 grandchildrenOccupation retired- mfg inspectorEnd of life: No CPR, no intubation, no  heroic measures (pt provided end of life packet)       Review of Systems System review is negative for any constitutional, cardiac, pulmonary, GI or neuro symptoms or complaints other than as described in the HPI.     Objective:   Physical Exam Filed Vitals:   11/12/11 1059  BP: 114/80  Pulse: 60  Temp: 98.6 F (37 C)  Resp: 16   Gen'l- WNWD nicely groomed AA woman in no distress HEENT - PERRLA, EOMI, fundoscopic exam deferred to ophthalmology Cor- RRR Neuro - A&O x 3, CN II-XII grossly intact.       Assessment & Plan:

## 2011-11-15 NOTE — Assessment & Plan Note (Signed)
BP Readings from Last 3 Encounters:  11/12/11 114/80  10/22/11 168/112  09/09/11 162/98   Blood pressure is much better controlled. She is tolerating medication well.  Plan Continue present regimen.

## 2011-11-26 ENCOUNTER — Ambulatory Visit (INDEPENDENT_AMBULATORY_CARE_PROVIDER_SITE_OTHER): Payer: Medicare Other | Admitting: Internal Medicine

## 2011-11-26 ENCOUNTER — Encounter: Payer: Self-pay | Admitting: Internal Medicine

## 2011-11-26 VITALS — BP 108/70 | HR 72 | Temp 98.3°F | Resp 16 | Ht 65.0 in | Wt 121.0 lb

## 2011-11-26 DIAGNOSIS — I10 Essential (primary) hypertension: Secondary | ICD-10-CM

## 2011-11-26 MED ORDER — OLMESARTAN MEDOXOMIL-HCTZ 40-25 MG PO TABS: 1.0000 | ORAL_TABLET | Freq: Every day | ORAL | Status: DC

## 2011-11-26 NOTE — Progress Notes (Signed)
  Subjective:    Patient ID: Amy Dunn, female    DOB: 11/10/1940, 71 y.o.   MRN: [id number]  HPI [name] presents for BP follow-up. She has been doing well. No adverse effects from medication. BP has been very well controlled.  \PMH, FamHx and SocHx reviewed for any changes and relevance.    Review of Systems System review is negative for any constitutional, cardiac, pulmonary, GI or neuro symptoms or complaints other than as described in the HPI. Current Outpatient Prescriptions on File Prior to Visit  Medication Sig Dispense Refill  . cyclobenzaprine (FLEXERIL) 5 MG tablet Take 5 mg by mouth 2 (two) times daily as needed.        . fexofenadine (ALLEGRA) 180 MG tablet Take 180 mg by mouth as needed.      . Multiple Vitamins-Minerals (ALIVE ONCE DAILY WOMENS 50+) TABS Take by mouth daily.        . nebivolol (BYSTOLIC) 10 MG tablet Take 1 tablet (10 mg total) by mouth daily.  70 tablet  0  . olmesartan-hydrochlorothiazide (BENICAR HCT) 40-25 MG per tablet Take 1 tablet by mouth daily.  70 tablet  0  . omeprazole-sodium bicarbonate (ZEGERID) 40-1100 MG per capsule Take 1 capsule by mouth daily before breakfast.        . traMADol (ULTRAM) 50 MG tablet take 1 tablet by mouth every 6 hours if needed  100 tablet  2       Objective:   Physical Exam Filed Vitals:   11/26/11 1401  BP: 108/70  Pulse: 72  Temp: 98.3 F (36.8 C)  Resp: 16   Gen'l WNWD AA woman Cor- RRR Pulom - normal       Assessment & Plan:

## 2011-11-26 NOTE — Assessment & Plan Note (Signed)
BP is very well controlled and patient is tolerating medication w/o adverse effects  Plan - continue benicar/hct - Rx sent in.   ROV 6 months

## 2011-12-02 ENCOUNTER — Other Ambulatory Visit: Payer: Self-pay

## 2011-12-02 DIAGNOSIS — I10 Essential (primary) hypertension: Secondary | ICD-10-CM

## 2011-12-02 MED ORDER — NEBIVOLOL HCL 10 MG PO TABS: 10.0000 mg | ORAL_TABLET | Freq: Every day | ORAL | Status: DC

## 2011-12-16 ENCOUNTER — Encounter (INDEPENDENT_AMBULATORY_CARE_PROVIDER_SITE_OTHER): Payer: Medicare Other | Admitting: Ophthalmology

## 2011-12-16 DIAGNOSIS — H2510 Age-related nuclear cataract, unspecified eye: Secondary | ICD-10-CM

## 2011-12-16 DIAGNOSIS — H35039 Hypertensive retinopathy, unspecified eye: Secondary | ICD-10-CM

## 2011-12-16 DIAGNOSIS — H43819 Vitreous degeneration, unspecified eye: Secondary | ICD-10-CM

## 2011-12-16 DIAGNOSIS — I10 Essential (primary) hypertension: Secondary | ICD-10-CM

## 2011-12-16 DIAGNOSIS — H348392 Tributary (branch) retinal vein occlusion, unspecified eye, stable: Secondary | ICD-10-CM

## 2012-01-04 ENCOUNTER — Other Ambulatory Visit: Payer: Self-pay

## 2012-01-04 MED ORDER — TRAMADOL HCL 50 MG PO TABS: 50.0000 mg | ORAL_TABLET | Freq: Four times a day (QID) | ORAL | Status: DC | PRN

## 2012-01-17 ENCOUNTER — Encounter (HOSPITAL_COMMUNITY): Payer: Self-pay | Admitting: *Deleted

## 2012-01-17 ENCOUNTER — Emergency Department (HOSPITAL_COMMUNITY): Payer: Medicare Other

## 2012-01-17 ENCOUNTER — Observation Stay (HOSPITAL_COMMUNITY)
Admission: EM | Admit: 2012-01-17 | Discharge: 2012-01-18 | Disposition: A | Discharge status: Home / Self Care | Payer: Medicare Other | Attending: Internal Medicine | Admitting: Internal Medicine

## 2012-01-17 DIAGNOSIS — K272 Acute peptic ulcer, site unspecified, with both hemorrhage and perforation: Secondary | ICD-10-CM

## 2012-01-17 DIAGNOSIS — K219 Gastro-esophageal reflux disease without esophagitis: Secondary | ICD-10-CM | POA: Insufficient documentation

## 2012-01-17 DIAGNOSIS — M1990 Unspecified osteoarthritis, unspecified site: Secondary | ICD-10-CM | POA: Diagnosis present

## 2012-01-17 DIAGNOSIS — G459 Transient cerebral ischemic attack, unspecified: Principal | ICD-10-CM | POA: Diagnosis present

## 2012-01-17 DIAGNOSIS — M169 Osteoarthritis of hip, unspecified: Secondary | ICD-10-CM | POA: Insufficient documentation

## 2012-01-17 DIAGNOSIS — R4701 Aphasia: Secondary | ICD-10-CM | POA: Insufficient documentation

## 2012-01-17 DIAGNOSIS — G939 Disorder of brain, unspecified: Secondary | ICD-10-CM

## 2012-01-17 DIAGNOSIS — M1610 Unilateral primary osteoarthritis, unspecified hip: Secondary | ICD-10-CM | POA: Insufficient documentation

## 2012-01-17 DIAGNOSIS — R911 Solitary pulmonary nodule: Secondary | ICD-10-CM | POA: Insufficient documentation

## 2012-01-17 DIAGNOSIS — G9389 Other specified disorders of brain: Secondary | ICD-10-CM

## 2012-01-17 DIAGNOSIS — C3490 Malignant neoplasm of unspecified part of unspecified bronchus or lung: Secondary | ICD-10-CM | POA: Diagnosis present

## 2012-01-17 DIAGNOSIS — J984 Other disorders of lung: Secondary | ICD-10-CM

## 2012-01-17 DIAGNOSIS — I10 Essential (primary) hypertension: Secondary | ICD-10-CM | POA: Diagnosis present

## 2012-01-17 LAB — COMPREHENSIVE METABOLIC PANEL
ALT: 10 U/L (ref 0–35)
AST: 15 U/L (ref 0–37)
CO2: 26 mEq/L (ref 19–32)
Calcium: 9.4 mg/dL (ref 8.4–10.5)
Chloride: 103 mEq/L (ref 96–112)
GFR calc non Af Amer: 57 mL/min — ABNORMAL LOW (ref >90)
Sodium: 138 mEq/L (ref 135–145)
Total Bilirubin: 0.4 mg/dL (ref 0.3–1.2)

## 2012-01-17 LAB — POCT I-STAT, CHEM 8
Calcium, Ion: 1.21 mmol/L (ref 1.13–1.30)
HCT: 35 % — ABNORMAL LOW (ref 36.0–46.0)
TCO2: 24 mmol/L (ref 0–100)

## 2012-01-17 LAB — CBC WITH DIFFERENTIAL/PLATELET
Basophils Absolute: 0 10*3/uL (ref 0.0–0.1)
Lymphocytes Relative: 30 % (ref 12–46)
Neutro Abs: 4.7 10*3/uL (ref 1.7–7.7)
Platelets: 199 10*3/uL (ref 150–400)
RDW: 15.2 % (ref 11.5–15.5)
WBC: 7.4 10*3/uL (ref 4.0–10.5)

## 2012-01-17 MED ORDER — DORZOLAMIDE HCL-TIMOLOL MAL 2-0.5 % OP SOLN
1.0000 [drp] | Freq: Two times a day (BID) | OPHTHALMIC | Status: DC
Start: 2012-01-17 — End: 2012-01-18
  Administered 2012-01-17 – 2012-01-18 (×2): 1 [drp] via OPHTHALMIC
  Filled 2012-01-17: qty 10

## 2012-01-17 MED ORDER — SODIUM CHLORIDE 0.9 % IV SOLN: INTRAVENOUS | Status: DC

## 2012-01-17 MED ORDER — SODIUM CHLORIDE 0.9 % IV SOLN
INTRAVENOUS | Status: DC
  Administered 2012-01-17: 12:00:00 via INTRAVENOUS

## 2012-01-17 MED ORDER — TRAMADOL HCL 50 MG PO TABS
50.0000 mg | ORAL_TABLET | Freq: Four times a day (QID) | ORAL | Status: DC | PRN
  Administered 2012-01-17: 50 mg via ORAL
  Filled 2012-01-17: qty 1

## 2012-01-17 MED ORDER — ASPIRIN 325 MG PO TABS
325.0000 mg | ORAL_TABLET | Freq: Every day | ORAL | Status: DC
  Administered 2012-01-17 – 2012-01-18 (×2): 325 mg via ORAL
  Filled 2012-01-17 (×2): qty 1

## 2012-01-17 MED ORDER — PANTOPRAZOLE SODIUM 40 MG PO TBEC
40.0000 mg | DELAYED_RELEASE_TABLET | Freq: Every day | ORAL | Status: DC
  Administered 2012-01-18: 40 mg via ORAL
  Filled 2012-01-17: qty 1

## 2012-01-17 MED ORDER — BIMATOPROST 0.03 % OP SOLN
1.0000 [drp] | Freq: Every day | OPHTHALMIC | Status: DC
  Administered 2012-01-17: 1 [drp] via OPHTHALMIC
  Filled 2012-01-17: qty 2.5

## 2012-01-17 MED ORDER — NEBIVOLOL HCL 10 MG PO TABS
10.0000 mg | ORAL_TABLET | Freq: Every day | ORAL | Status: DC
  Administered 2012-01-18: 10 mg via ORAL
  Filled 2012-01-17: qty 1

## 2012-01-17 MED ORDER — ACETAMINOPHEN 325 MG PO TABS: 650.0000 mg | ORAL_TABLET | ORAL | Status: DC | PRN

## 2012-01-17 MED ORDER — LORATADINE 10 MG PO TABS
10.0000 mg | ORAL_TABLET | Freq: Every day | ORAL | Status: DC
  Administered 2012-01-18: 10 mg via ORAL
  Filled 2012-01-17: qty 1

## 2012-01-17 NOTE — ED Notes (Signed)
Called floor to give report, receiving RN to call back

## 2012-01-17 NOTE — H&P (Signed)
History and Physical       Hospital Admission Note Date: 01/17/2012  Patient name: Amy Dunn Medical record number: [id number] Date of birth: 1940/12/01 Age: 71 y.o. Gender: female PCP: Illene Regulus, MD   Chief Complaint:  Right lower extremity weakness with slurred speech/aphasia x 2 today  HPI: Patient is a 71 year old female with history of hypertension, GERD presented with 2 episodes of right lower extremity weakness with slurred speech and aphasia today. She was obtained from the patient who stated that she was in a grocery store at 10:30 AM when she experienced a right lower extremity weakness while walking. She came out of the grocery store and started holding onto things to avoid any fall. She got into her car, drove home however could not get out of the car. She also had some slurred speech and was not able to get her words out. Her husband brought her to the ED but her symptoms had resolved in 15 minutes. In ED, during the CT imaging, about 30 minutes later, patient had another repeat episode and lasted less than 5 minutes. During both episodes patient stated that she had burning sensation around her left eye but no significant visual changes. CT done in the ED showed 3.2 cm right frontoparietal lesion possibly meningioma with no mass effect. Neurosurgery, Dr. Gerlene Fee was consulted in ED and did not recommend any acute neurosurgical intervention. Neurology was consulted and recommended an MRI brain further workup. Patient is not on any aspirin and Plavix prior to this episodes today, she denies any history of stroke or TIAs in the past.  Review of Systems:  Constitutional: Denies fever, chills, diaphoresis, appetite change and fatigue.  HEENT: Denies photophobia, redness, hearing loss, ear pain, congestion, sore throat, rhinorrhea, sneezing, mouth sores, trouble swallowing, neck pain, neck stiffness and tinnitus.   Respiratory:  Denies SOB, DOE, cough, chest tightness,  and wheezing.   Cardiovascular: Denies chest pain, palpitations and leg swelling.  Gastrointestinal: Denies nausea, vomiting, abdominal pain, diarrhea, constipation, blood in stool and abdominal distention.  Genitourinary: Denies dysuria, urgency, frequency, hematuria, flank pain and difficulty urinating.  Musculoskeletal: Denies myalgias, back pain, joint swelling, arthralgias and gait problem.  Skin: Denies pallor, rash and wound.  Neurological: Please see history of present illness  Hematological: Denies adenopathy. Easy bruising, personal or family bleeding history  Psychiatric/Behavioral: Denies suicidal ideation, mood changes, confusion, nervousness, sleep disturbance and agitation  Past Medical History: Past Medical History  Diagnosis Date  . History of colonic polyps   . Acute peptic ulcer, unspecified site, with hemorrhage and perforation, without mention of obstruction   . Osteoarthritis of left shoulder   . Primary osteoarthritis of left hip   . Pulmonary nodule, left     Left Lower lobe  . GERD (gastroesophageal reflux disease)   . Cough   . Hypertension    Past Surgical History  Procedure Date  . Colon surgery   . Surgical repair of peptic ulcer     Medications: Prior to Admission medications   Medication Sig Start Date End Date Taking? Authorizing Provider  bimatoprost (LUMIGAN) 0.03 % ophthalmic solution Place 1 drop into the left eye at bedtime.   Yes Historical Provider, MD  dorzolamide-timolol (COSOPT) 22.3-6.8 MG/ML ophthalmic solution Place 1 drop into the right eye 2 (two) times daily.   Yes Historical Provider, MD  fexofenadine (ALLEGRA) 180 MG tablet Take 180 mg by mouth daily as needed. For allergies   Yes Historical Provider, MD  Multiple Vitamins-Minerals (ALIVE  ONCE DAILY WOMENS 50+) TABS Take 1 tablet by mouth daily.    Yes Historical Provider, MD  nebivolol (BYSTOLIC) 10 MG tablet Take 1 tablet (10 mg total) by  mouth daily. 12/02/11 12/01/12 Yes Jacques Navy, MD  omeprazole-sodium bicarbonate (ZEGERID) 40-1100 MG per capsule Take 1 capsule by mouth daily before breakfast.     Yes Historical Provider, MD  traMADol (ULTRAM) 50 MG tablet Take 1 tablet (50 mg total) by mouth every 6 (six) hours as needed for pain. 01/04/12  Yes Jacques Navy, MD    Allergies:   Allergies  Allergen Reactions  . Amlodipine     Trouble swallowing  . Penicillins     REACTION: itching    Social History:  reports that she quit smoking about 30 years ago. Her smoking use included Cigarettes. She quit after 1 year of use. She has never used smokeless tobacco. She reports that she does not drink alcohol or use illicit drugs.  Family History: Family History  Problem Relation Age of Onset  . Hypertension Mother   . Heart disease Mother   . Heart attack Mother   . Cancer Father     Colon cancer    Physical Exam: Blood pressure 198/94, pulse 70, temperature 98.5 F (36.9 C), temperature source Oral, resp. rate 18, SpO2 100.00%. General: Alert, awake, oriented x3, in no acute distress. HEENT: normocephalic, atraumatic, anicteric sclera, pink conjunctiva, pupils equal and reactive to light and accomodation, oropharynx clear Neck: supple, no masses or lymphadenopathy, no goiter, no bruits  Heart: Regular rate and rhythm, without murmurs, rubs or gallops. Lungs: Clear to auscultation bilaterally, no wheezing, rales or rhonchi. Abdomen: Soft, nontender, nondistended, positive bowel sounds, no masses. Extremities: No clubbing, cyanosis or edema with positive pedal pulses. Neuro: Grossly intact, no focal neurological deficits, strength 5/5 upper and lower extremities bilaterally, no dysarthria is noted at the time of my examination Psych: alert and oriented x 3, normal mood and affect Skin: no rashes or lesions, warm and dry   LABS on Admission:  Basic Metabolic Panel:  Lab 01/17/12 1610 01/17/12 1128  NA 138 142    K 3.9 3.9  CL 103 105  CO2 26 --  GLUCOSE 95 96  BUN 12 12  CREATININE 0.96 1.10  CALCIUM 9.4 --  MG -- --  PHOS -- --   Liver Function Tests:  Lab 01/17/12 1135  AST 15  ALT 10  ALKPHOS 87  BILITOT 0.4  PROT 7.7  ALBUMIN 3.8   CBC:  Lab 01/17/12 1135 01/17/12 1128  WBC 7.4 --  NEUTROABS 4.7 --  HGB 11.6* 11.9*  HCT 31.8* 35.0*  MCV 77.2* --  PLT 199 --     Radiological Exams on Admission: Ct Head Wo Contrast  01/17/2012  *RADIOLOGY REPORT*  Clinical Data: Right-sided leg weakness.  Difficulty speaking.  CT HEAD WITHOUT CONTRAST  Technique:  Contiguous axial images were obtained from the base of the skull through the vertex without contrast.  Comparison: None.  Findings: Bone windows demonstrate Clear paranasal sinuses and mastoid air cells.  Soft tissue windows demonstrate a primarily calcified mass along the cortex of the high right frontal parietal region measures 3.2 cm on image 22 and has adjacent osseous remodeling.  No significant mass effect or surrounding vasogenic edema.  There is moderate to severe and age advanced low density in the periventricular white matter likely related to small vessel disease.Otherwise, no  mass lesion, hemorrhage, hydrocephalus, acute infarct, intra-axial, or extra-axial  fluid collection.  IMPRESSION:  1.  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  No significant mass effect or surrounding vasogenic edema. 2.  Moderate to marked and age advanced small vessel ischemic change. 3. No acute intracranial abnormality.  Original Report Authenticated By: Consuello Bossier, M.D.    Assessment/Plan  .Brain mass : Newly diagnosed on CT scan done today  - Admit to telemetry, will obtain MRI of the brain with and without contrast as recommended by neurology  - Neurosurgery, Dr. Gerlene Fee was consulted by EDP, who recommended outpatient followup in no acute neurosurgical intervention. - Will place patient on  neurochecks   .TIA (transient ischemic attack): The patient's presentation is likely secondary to TIA's (or CVA), however she does have a new brain mass - Will place her on TIA pathway, obtain MRI brain with and without contrast, MRA, 2D echo, carotid dopplers, PT/OT eval - Placed on aspirin, obtain lipid panel, neurology consult pending MRI results    .HYPERTENSION - Continue outpatient antihypertensives, place on gentle hydration   .GERD (gastroesophageal reflux disease): - placed on PPI  .PULMONARY NODULE, LEFT LOWER LOBE: out-patient followup by PCP  .OSTEOARTHRITIS, HIP, LEFT: continue tramadol  DVT prophylaxis: SCD's  CODE STATUS: FC  Further plan will depend as patient's clinical course evolves and further radiologic and laboratory data become available.   Time Spent on Admission: 1 hour  Rachel Samples M.D. [facility] 01/17/2012, 4:47 PM Pager: [phone]  If 7PM-7AM, please contact night-coverage [email or web address] Password [email or web address]

## 2012-01-17 NOTE — ED Provider Notes (Signed)
History     CSN: [id number]  Arrival date & time 01/17/12  1107   First MD Initiated Contact with Patient 01/17/12 1125      Chief Complaint  Patient presents with  . Aphasia    (Consider location/radiation/quality/duration/timing/severity/associated sxs/prior treatment) The history is provided by the patient.   patient here complaining of 2 episodes of right-sided weakness with associated slurred speech that lasted for between 10-15 minutes. No associated headache or blurred vision. No neck pain, chest pain, shortness of breath. She is asymptomatic at this time. No prior history of same. Nothing makes her symptoms better or worse. She denies any ataxia. No syncope or near-syncope. Denies any dizziness. No treatment used prior to arrival  Past Medical History  Diagnosis Date  . History of colonic polyps   . Acute peptic ulcer, unspecified site, with hemorrhage and perforation, without mention of obstruction   . Osteoarthritis of left shoulder   . Primary osteoarthritis of left hip   . Pulmonary nodule, left     Left Lower lobe  . GERD (gastroesophageal reflux disease)   . Cough   . Hypertension     Past Surgical History  Procedure Date  . Colon surgery   . Surgical repair of peptic ulcer     Family History  Problem Relation Age of Onset  . Hypertension Mother   . Heart disease Mother   . Heart attack Mother   . Cancer Father     Colon cancer    History  Substance Use Topics  . Smoking status: Former Smoker -- 1 years    Types: Cigarettes    Quit date: 05/26/1981  . Smokeless tobacco: Never Used   Comment: 2 ciggs per day  . Alcohol Use: No    OB History    Grav Para Term Preterm Abortions TAB SAB Ect Mult Living                  Review of Systems  All other systems reviewed and are negative.    Allergies  Amlodipine and Penicillins  Home Medications   Current Outpatient Rx  Name Route Sig Dispense Refill  . CYCLOBENZAPRINE HCL 5 MG PO TABS  Oral Take 5 mg by mouth 2 (two) times daily as needed.      Marland Kitchen FEXOFENADINE HCL 180 MG PO TABS Oral Take 180 mg by mouth as needed.    Marland Kitchen ALIVE ONCE DAILY WOMENS 50+ PO TABS Oral Take by mouth daily.      . NEBIVOLOL HCL 10 MG PO TABS Oral Take 1 tablet (10 mg total) by mouth daily. 30 tablet 5  . OLMESARTAN MEDOXOMIL-HCTZ 40-25 MG PO TABS Oral Take 1 tablet by mouth daily. 30 tablet 11  . OMEPRAZOLE-SODIUM BICARBONATE 40-1100 MG PO CAPS Oral Take 1 capsule by mouth daily before breakfast.      . TRAMADOL HCL 50 MG PO TABS Oral Take 1 tablet (50 mg total) by mouth every 6 (six) hours as needed for pain. 100 tablet 2    BP 198/94  Pulse 70  Temp 98.5 F (36.9 C) (Oral)  Resp 18  SpO2 100%  Physical Exam  Nursing note and vitals reviewed. Constitutional: She is oriented to person, place, and time. She appears well-developed and well-nourished.  Non-toxic appearance. No distress.  HENT:  Head: Normocephalic and atraumatic.  Eyes: Conjunctivae, EOM and lids are normal. Pupils are equal, round, and reactive to light.  Neck: Normal range of motion. Neck supple. No tracheal  deviation present. No mass present.  Cardiovascular: Normal rate, regular rhythm and normal heart sounds.  Exam reveals no gallop.   No murmur heard. Pulmonary/Chest: Effort normal and breath sounds normal. No stridor. No respiratory distress. She has no decreased breath sounds. She has no wheezes. She has no rhonchi. She has no rales.  Abdominal: Soft. Normal appearance and bowel sounds are normal. She exhibits no distension. There is no tenderness. There is no rebound and no CVA tenderness.  Musculoskeletal: Normal range of motion. She exhibits no edema and no tenderness.  Neurological: She is alert and oriented to person, place, and time. She has normal strength. No cranial nerve deficit or sensory deficit. GCS eye subscore is 4. GCS verbal subscore is 5. GCS motor subscore is 6.  Skin: Skin is warm and dry. No abrasion and  no rash noted.  Psychiatric: She has a normal mood and affect. Her speech is normal and behavior is normal.    ED Course  Procedures (including critical care time)  Labs Reviewed  POCT I-STAT, CHEM 8 - Abnormal; Notable for the following:    Hemoglobin 11.9 (*)     HCT 35.0 (*)     All other components within normal limits  CBC WITH DIFFERENTIAL  COMPREHENSIVE METABOLIC PANEL   No results found.   No diagnosis found.    MDM   Date: 01/17/2012  Rate: 69  Rhythm: normal sinus rhythm  QRS Axis: normal  Intervals: normal  ST/T Wave abnormalities: normal  Conduction Disutrbances:none  Narrative Interpretation:   Old EKG Reviewed: none available  3:33 PM Patient's x-ray results noted and a consultation was obtained with neurology on call. They recommended an MRI with and without contrast as well as neurosurgical consultation. I spoke with Dr. Gerlene Fee from neurosurgery and he recommends that patient be followed as outpatient for the 2.3 cm mass in her right frontoparietal area. I have spoken with [facility] and they will admit the patient for evaluation of her TIA.        Toy Baker, MD 01/17/12 1536

## 2012-01-17 NOTE — ED Notes (Signed)
Pt reports two episodes since this am where she has had thick slurred speech and right leg weakness. At this time pts speech is clear and no neuro deficits noted. Labs drawn and ekg completed. Pt brought to room for EDP exam. Code stroke note called due to resolve of symptoms. Pt states she feels fine now just a little bit of a "thick tongue."

## 2012-01-17 NOTE — ED Notes (Signed)
ED tech attempted to ambulate pt to bathroom. Pt stood from stretcher and unable to tolerate standing, became lightheaded and had to be placed back into bed by tech.

## 2012-01-17 NOTE — ED Notes (Addendum)
CT tech reported pt had aphasic episode while in CT. Stated that she ask pt a question and pt began to become tearful and shake her head but unable to speak. Episode lasted less than 20 seconds after which pt was able to speak.

## 2012-01-17 NOTE — Consult Note (Signed)
Reason for Consult:Right LE weakness and word finding difficulty. Referring Physician: Dr. Freida Busman Location; ED  CC: Right LE weakness and word finding difficulty HPI:  This is a  71 y/o RHAAF with who has stroke risk factors who was out doing grocery shopping where she noticed that she had right lower extremity weakness while walking. She did not fall  And then she explains of word finding difficulty. She admits that she also had headaches with retro- orbital pain on the left eye, but no phonophobia, no photophobia, no vomiting, but nauseated, no dizziness, no vertigo, no ataxia, no back pain, no neck pain.  This happened around 10:30 this morning but also had some right LE weakness only this morning. According to her the symptoms lasted for less than 5 minutes. She denies LOC, No bladder or bowel incontinent, no chest pain, no sob, no abdominal pain, she has chronic hip pain. 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   NIH  Scale was 1  TPA: No Symptoms resolved.   Past Medical History  Diagnosis Date  . History of colonic polyps   . Acute peptic ulcer, unspecified site, with hemorrhage and perforation, without mention of obstruction   . Osteoarthritis of left shoulder   . Primary osteoarthritis of left hip   . Pulmonary nodule, left     Left Lower lobe  . GERD (gastroesophageal reflux disease)   . Cough   . Hypertension     Past Surgical History  Procedure Date  . Colon surgery   . Surgical repair of peptic ulcer     Family History  Problem Relation Age of Onset  . Hypertension Mother   . Heart disease Mother   . Heart attack Mother   . Cancer Father     Colon cancer    Social History:  reports that she quit smoking about 30 years ago. Her smoking use included Cigarettes. She quit after 1 year of use. She  has never used smokeless tobacco. She reports that she does not drink alcohol or use illicit drugs.  Allergies  Allergen Reactions  . Amlodipine     Trouble swallowing  . Penicillins     REACTION: itching    Medications: Scheduled:   ROS:  all the 14 point review of systems were reviewed  And the pertinent are mentioned in the HPI  Physical Examination: Blood pressure 182/92, pulse 69, temperature 98.3 F (36.8 C), temperature source Oral, resp. rate 16, SpO2 100.00%.   General: AAO X4 Very good historian. HEENT: EOMI, no neglect, no ptosis,  PEERLA Neck: Supple, no JVD, no carotid bruit, no LAD Lungs: CTA Heart: RRR, no murmurs, rubs and or gallops Abdomen: soft,. Non tender, non distended, bowel sounds present Skin; No rash Extremities: no edema    Neurologic Examination Mental status:  AAO Memory: intact Cognition intact Thought processes: slow Insight: intact No hallucinations,  No delusions   Speech: clear, no aphasia, no dysarthria  Cranial Nerves: II: visual fields grossly normal, pupils equal, round, reactive to light and accommodation III,IV, VI: ptosis not present, extra-ocular motions intact bilaterally V,VII: smile symmetric, facial light touch sensation normal bilaterally VIII: hearing normal bilaterally IX,X: gag reflex present XI: trapezius strength/neck flexion strength normal bilaterally XII: tongue strength normal  Motor: Right : Upper extremity   5/5  Left:     Upper extremity   5/5  Lower extremity   4+/5     Lower extremity   5/5 Tone and bulk:normal tone throughout;   Atrophy noted in the intrinsics, thenar and hypothenar muscles and around peri-tibial area  Sensory: Pinprick and light touch, vibration  intact throughout, bilaterally Deep Tendon Reflexes: 2+ and symmetric throughout Plantars: Right: downgoing   Left: downgoing Cerebellar: normal finger-to-nose, normal rapid alternating movements are normal.  Mild compromise in  heel to shin on the right lower leg   Laboratory Studies:   Basic Metabolic Panel:  Lab 01/17/12 6962 01/17/12 1128  NA 138 142  K 3.9 3.9  CL 103 105  CO2 26 --  GLUCOSE 95 96  BUN 12 12  CREATININE 0.96 1.10  CALCIUM 9.4 --  MG -- --  PHOS -- --    Liver Function Tests:  Lab 01/17/12 1135  AST 15  ALT 10  ALKPHOS 87  BILITOT 0.4  PROT 7.7  ALBUMIN 3.8   No results found for this basename: LIPASE:5,AMYLASE:5 in the last 168 hours No results found for this basename: AMMONIA:3 in the last 168 hours  CBC:  Lab 01/17/12 1135 01/17/12 1128  WBC 7.4 --  NEUTROABS 4.7 --  HGB 11.6* 11.9*  HCT 31.8* 35.0*  MCV 77.2* --  PLT 199 --    Cardiac Enzymes: No results found for this basename: CKTOTAL:5,CKMB:5,CKMBINDEX:5,TROPONINI:5 in the last 168 hours  BNP: No components found with this basename: POCBNP:5  CBG: No results found for this basename: GLUCAP:5 in the last 168 hours  Microbiology: No results found for this or any previous visit.  Coagulation Studies: No results found for this basename: LABPROT:5,INR:5 in the last 72 hours  Urinalysis: No results found for this basename: COLORURINE:2,APPERANCEUR:2,LABSPEC:2,PHURINE:2,GLUCOSEU:2,HGBUR:2,BILIRUBINUR:2,KETONESUR:2,PROTEINUR:2,UROBILINOGEN:2,NITRITE:2,LEUKOCYTESUR:2 in the last 168 hours  Lipid Panel:     Component Value Date/Time   CHOL 212* 03/18/2010 1129   TRIG 281.0* 03/18/2010 1129   HDL 41.80 03/18/2010 1129   CHOLHDL 5 03/18/2010 1129   VLDL 56.2* 03/18/2010 1129    HgbA1C:  No results found for this basename: HGBA1C    Urine Drug Screen:   No results found for this basename: labopia,  cocainscrnur,  labbenz,  amphetmu,  thcu,  labbarb    Alcohol Level: No results found for this basename: ETH:2 in the last 168 hours  Other results: EKG: normal EKG, normal sinus rhythm, unchanged from previous tracings.  Imaging: Ct Head Wo Contrast  01/17/2012  *RADIOLOGY REPORT*  Clinical Data:  Right-sided leg weakness.  Difficulty speaking.  CT HEAD WITHOUT CONTRAST  Technique:  Contiguous axial images were obtained from the base of the skull through the vertex without contrast.  Comparison: None.  Findings: Bone windows demonstrate Clear paranasal sinuses and mastoid air cells.  Soft tissue windows demonstrate a primarily calcified mass along the cortex of the high right frontal parietal region measures 3.2 cm on image 22 and has adjacent osseous remodeling.  No significant mass effect or surrounding vasogenic edema.  There is moderate to severe and age advanced low density in the periventricular white matter likely related to small vessel disease.Otherwise, no  mass lesion, hemorrhage, hydrocephalus, acute infarct, intra-axial, or extra-axial fluid collection.  IMPRESSION:  1.  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  No significant mass effect or surrounding vasogenic edema. 2.  Moderate to marked and age advanced small vessel ischemic change. 3. No acute intracranial abnormality.  Original  Report Authenticated By: Consuello Bossier, M.D.     Assessment/Plan:  71 y/o RHAAF with 5 minute episode of word finding difficulty, and right LE weakness  with no seizure like activity  No loc, no other sensory or cranial nerve deficits.  Chronic Hip pain. Head CT indicative of 3 cmm Meningioma Differential Diagnosis 1) Left frontal , corona radiata, BG or other subcortical ischemia 2) TIA 3) Cervical radiculopathy   Recommendation MRI of the brain with and without contrast  1) If the MRI indicates ischemic stroke, can complete the stroke work up 2)  PT/OT 3)  Frontal Meningiomas can cause seizures but no Prophylaxis Anti seizure medication at this time.  Jalon Squier V-P Eilleen Kempf., MD., Ph.D.,MS 01/17/2012 6:40 PM

## 2012-01-18 ENCOUNTER — Observation Stay (HOSPITAL_COMMUNITY): Payer: Medicare Other

## 2012-01-18 DIAGNOSIS — G459 Transient cerebral ischemic attack, unspecified: Secondary | ICD-10-CM

## 2012-01-18 LAB — LIPID PANEL
HDL: 44 mg/dL (ref >39)
Total CHOL/HDL Ratio: 3.9 RATIO
VLDL: 26 mg/dL (ref 0–40)

## 2012-01-18 LAB — HEMOGLOBIN A1C: Mean Plasma Glucose: 117 mg/dL — ABNORMAL HIGH (ref <117)

## 2012-01-18 MED ORDER — GADOBENATE DIMEGLUMINE 529 MG/ML IV SOLN
12.0000 mL | Freq: Once | INTRAVENOUS | Status: AC
  Administered 2012-01-18: 12 mL via INTRAVENOUS

## 2012-01-18 MED ORDER — CLOPIDOGREL BISULFATE 75 MG PO TABS
75.0000 mg | ORAL_TABLET | Freq: Every day | ORAL | Status: DC
  Administered 2012-01-18: 75 mg via ORAL
  Filled 2012-01-18: qty 1

## 2012-01-18 MED ORDER — CLOPIDOGREL BISULFATE 75 MG PO TABS: 75.0000 mg | ORAL_TABLET | Freq: Every day | ORAL | Status: DC

## 2012-01-18 NOTE — Plan of Care (Signed)
Problem: Phase II Progression Outcomes Goal: Able to communicate Outcome: Completed/Met Date Met:  01/18/12 Reports baseline

## 2012-01-18 NOTE — Progress Notes (Addendum)
[facility] PROGRESS NOTE    SUBJECTIVE   Patient is alert and oriented.  Back to her baseline. Non complaints.   OBJECTIVE   Vital signs in last 24 hours: Temp:  [98.3 F (36.8 C)-99.1 F (37.3 C)] 98.3 F (36.8 C) (08/19 0811) Pulse Rate:  [61-70] 68  (08/19 0811) Resp:  [15-20] 20  (08/19 0811) BP: (143-198)/(81-103) 153/89 mmHg (08/19 0811) SpO2:  [96 %-100 %] 97 % (08/19 0811) Weight:  [56.4 kg (124 lb 5.4 oz)] 56.4 kg (124 lb 5.4 oz) (08/18 1815)  Intake/Output from previous day: 08/18 0701 - 08/19 0700 In: 1195 [P.O.:120; I.V.:1075] Out: 650 [Urine:650] Intake/Output this shift: Total I/O In: 240 [P.O.:240] Out: -  Nutritional status: Cardiac  Past Medical History  Diagnosis Date  . History of colonic polyps   . Acute peptic ulcer, unspecified site, with hemorrhage and perforation, without mention of obstruction   . Osteoarthritis of left shoulder   . Primary osteoarthritis of left hip   . Pulmonary nodule, left     Left Lower lobe  . GERD (gastroesophageal reflux disease)   . Cough   . Hypertension     Neurologic ROS negative with exception of above   Neurologic Exam:  Mental Status: Alert, oriented X 3.  Speech fluent without evidence of aphasia. Able to follow 3 step commands without difficulty. Mood: upbeat Memory: intact Thought content appropriate Cranial Nerves: II-Visual fields grossly intact. III/IV/VI-Extraocular movements intact.  Pupils reactive bilaterally. Ptosis not present. V/VII-Smile symmetric VIII-grossly intact IX/X-normal gag XI-bilateral shoulder shrug XII-midline tongue extension Motor: 5/5 bilaterally with normal tone and bulk.  Mild left drift.  Sensory: Pinprick and light touch intact throughout, bilaterally Deep Tendon Reflexes:  Right: Upper Extremity   Left: Upper extremity   biceps (C-5 to C-6) 2/4   biceps (C-5 to C-6) 2/4 tricep (C7) 2/4    triceps (C7)  2/4 Brachioradialis (C6) 2/4  Brachioradialis (C6) 2/4  Lower Extremity Lower Extremity  quadriceps (L-2 to L-4) 2/4   quadriceps (L-2 to L-4) 2/4 Achilles (S1) 2/4   Achilles (S1) 2/4      Plantars:      Right:  downgoing     Left:  Downgoing Cerebellar: Normal finger-to-nose, normal rapid alternating movements and normal heel-to-shin test.   Gait: Normal gait and station.   Lab Results: Lab Results  Component Value Date/Time   CHOL 173 01/18/2012  7:30 AM   Lipid Panel  Basename 01/18/12 0730  CHOL 173  TRIG 130  HDL 44  CHOLHDL 3.9  VLDL 26  LDLCALC 161*    Studies/Results: Ct Head Wo Contrast  01/17/2012  *RADIOLOGY REPORT*  Clinical Data: Right-sided leg weakness.  Difficulty speaking.  CT HEAD WITHOUT CONTRAST  Technique:  Contiguous axial images were obtained from the base of the skull through the vertex without contrast.  Comparison: None.  Findings: Bone windows demonstrate Clear paranasal sinuses and mastoid air cells.  Soft tissue windows demonstrate a primarily calcified mass along the cortex of the high right frontal parietal region measures 3.2 cm on image 22 and has adjacent osseous remodeling.  No significant mass effect or surrounding vasogenic edema.  There is moderate to severe and age advanced low density in the periventricular white matter likely related to small vessel  disease.Otherwise, no  mass lesion, hemorrhage, hydrocephalus, acute infarct, intra-axial, or extra-axial fluid collection.  IMPRESSION:  1.  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  No significant mass effect or surrounding vasogenic edema. 2.  Moderate to marked and age advanced small vessel ischemic change. 3. No acute intracranial abnormality.  Original Report Authenticated By: Consuello Bossier, M.D.    Medications:     Scheduled:   . sodium chloride   Intravenous STAT  . aspirin  325 mg Oral Daily  . bimatoprost  1 drop Left Eye QHS  .  dorzolamide-timolol  1 drop Right Eye BID  . loratadine  10 mg Oral Daily  . nebivolol  10 mg Oral Daily  . pantoprazole  40 mg Oral Q1200    Assessment/Plan:   71 y/o RHAAF with 5 minute episode of word finding difficulty, and right LE weakness with no seizure like activity  No loc, no other sensory or cranial nerve deficits.  Head CT indicative of 3 cmm Meningioma followed by neurosurgery out patient.  Differential Diagnosis  1) Left frontal , corona radiata, BG or other subcortical ischemia  2) TIA  3) Cervical radiculopathy  Recommend: 1) MRI brain for possible ischemic stroke. ECHO and doppler has been ordered by primary service. Will continue to follow.     Addendum:  Reviewed MRI results and discussed with Dr. Eilleen Kempf.  NO acute CVA is noted in MRI.  Recommend Continue antiplatelet and F/U with neurosurgery for meningioma.  Neurology will Sign off.   Felicie Morn PA-C [facility] [phone]  01/18/2012, 10:10 AM

## 2012-01-18 NOTE — Evaluation (Signed)
Occupational Therapy Evaluation Patient Details Name: Amy Dunn MRN: [id number] DOB: Jan 06, 1941 Today's Date: 01/18/2012 Time: 1308-6578 OT Time Calculation (min): 21 min  OT Assessment / Plan / Recommendation Clinical Impression  71 yo female admitted with pain around Lt eye, near fall, slurred speech and Rt LE weakness. 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 MRI pending. OT to follow acutely for balance deficits. Recommend outpatient.    OT Assessment  Patient needs continued OT Services    Follow Up Recommendations  Outpatient OT    Barriers to Discharge      Equipment Recommendations  None recommended by OT    Recommendations for Other Services    Frequency  Min 2X/week    Precautions / Restrictions Precautions Precautions: Fall Restrictions Weight Bearing Restrictions: No   Pertinent Vitals/Pain None Visual changes noted    ADL  Eating/Feeding: Simulated;Independent Where Assessed - Eating/Feeding: Edge of bed Grooming: Simulated;Wash/dry hands;Wash/dry face;Teeth care;Modified independent Where Assessed - Grooming: Unsupported sitting Toilet Transfer: Simulated;Modified independent Toilet Transfer Method: Sit to Barista: Regular height toilet Equipment Used: Gait belt Transfers/Ambulation Related to ADLs: Pt ambulated ~150 ft during session with gait velocity of 14 seconds for 32 ft. Pt demonstrates fall risk with objects crossing visual field (people walking in front of patient) and with head turns. pt with LOB posteriorly with completing figure 8 turns. Pt required > 4 steps to stop and turn 180 degrees. Pt with visual changes affecting balance. ADL Comments: Pt is at near baseline for the ability to organize and sequence task however pt's balance affects ability to carry out ADLs safely    OT Diagnosis: Disturbance of vision;Generalized weakness  OT Problem List: Decreased activity tolerance;Impaired  balance (sitting and/or standing);Decreased knowledge of precautions OT Treatment Interventions: Self-care/ADL training;DME and/or AE instruction;Therapeutic activities;Balance training;Patient/family education   OT Goals Acute Rehab OT Goals OT Goal Formulation: With patient Time For Goal Achievement: 01/25/12 Potential to Achieve Goals: Good ADL Goals Pt Will Perform Upper Body Bathing: Independently;Standing at sink ADL Goal: Upper Body Bathing - Progress: Goal set today Pt Will Perform Lower Body Bathing: Independently;Standing at sink ADL Goal: Lower Body Bathing - Progress: Goal set today Pt Will Perform Upper Body Dressing: Standing ADL Goal: Upper Body Dressing - Progress: Goal set today Pt Will Perform Lower Body Dressing: Standing ADL Goal: Lower Body Dressing - Progress: Goal set today Miscellaneous OT Goals Miscellaneous OT Goal #1: Pt will complete Berg with score > 50 to demonstrate decreased fall risk OT Goal: Miscellaneous Goal #1 - Progress: Goal set today  Visit Information  Last OT Received On: 01/18/12 Assistance Needed: +1    Subjective Data  Subjective: "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" Patient Stated Goal: to return to saling Amy Dunn   Prior Functioning  Vision/Perception  Home Living Lives With: Spouse Available Help at Discharge: Available 24 hours/day (could be called in for work) Type of Home: House Home Access: Stairs to enter Secretary/administrator of Steps: 2 Entrance Stairs-Rails: None Home Layout: Two level;Bed/bath upstairs Alternate Level Stairs-Number of Steps: 15 Alternate Level Stairs-Rails: Right Bathroom Shower/Tub: Tub/shower unit;Curtain Firefighter: Standard Bathroom Accessibility: Yes How Accessible: Accessible via walker Home Adaptive Equipment: Straight cane Prior Function Level of Independence: Independent Able to Take Stairs?: Yes Driving: Yes Vocation: Full time employment Comments: sales [employer] Communication Communication: No difficulties Dominant Hand: Right   Vision - Assessment Eye Alignment: Within Functional Limits Vision  Assessment: Vision tested Ocular Range of Motion: Within Functional Limits Tracking/Visual Pursuits: Decreased smoothness of horizontal tracking;Decreased smoothness of vertical tracking;Impaired - to be further tested in functional context;Decreased smoothness of eye movement to LEFT inferior field Saccades: Additional eye shifts occurred during testing;Additional head turns occurred during testing;Decreased speed of saccadic movement Convergence: Impaired (comment) Additional Comments: Pt with greatest deficits with object crossing visual field. Pt unable to track object from Rt inferior to lt superior angels and vise versa. Perception Perception: Within Functional Limits  Cognition  Overall Cognitive Status: Appears within functional limits for tasks assessed/performed Arousal/Alertness: Awake/alert Orientation Level: Appears intact for tasks assessed Behavior During Session: [facility] for tasks performed    Extremity/Trunk Assessment Right Upper Extremity Assessment RUE ROM/Strength/Tone: Within functional levels RUE Sensation: WFL - Light Touch RUE Coordination: WFL - gross/fine motor Left Upper Extremity Assessment LUE ROM/Strength/Tone: Within functional levels LUE Sensation: WFL - Light Touch LUE Coordination: WFL - gross/fine motor Trunk Assessment Trunk Assessment: Normal   Mobility Bed Mobility Bed Mobility: Supine to Sit;Sitting - Scoot to Edge of Bed Supine to Sit: 7: Independent;HOB elevated Sitting - Scoot to Edge of Bed: 7: Independent Details for Bed Mobility Assistance: at baseline Transfers Transfers: Sit to Stand;Stand to Sit Sit to Stand: 6: Modified independent (Device/Increase time);With upper extremity assist;From bed Stand to Sit: 6: Modified independent (Device/Increase time);To chair/3-in-1;With upper extremity assist    Exercise    Balance High Level Balance High Level Balance Activites: Turns;Head turns;Sudden stops High Level Balance Comments: See adl comment: Pt fall risk with head turns or visual challenges  End of Session OT - End of Session Equipment Utilized During Treatment: Gait belt Activity Tolerance: Patient tolerated treatment well Patient left: in chair;with call bell/phone within reach Nurse Communication: Mobility status  GO     Harrel Carina [facility] 01/18/2012, 9:36 AM Pager: [phone]

## 2012-01-18 NOTE — Progress Notes (Signed)
*  PRELIMINARY RESULTS* Echocardiogram 2D Echocardiogram has been performed.  Amy Dunn 01/18/2012, 2:52 PM

## 2012-01-18 NOTE — Evaluation (Signed)
Physical Therapy Evaluation Patient Details Name: Amy Dunn MRN: [id number] DOB: [birth year] Today's Date: 01/18/2012 Time: 1030-1047 PT Time Calculation (min): 17 min  PT Assessment / Plan / Recommendation Clinical Impression    71 yo female admitted with pain around Lt eye, near fall, slurred speech and Rt LE weakness. 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 MRI pending.  Pt presents with mild balance deficits with cervical rotation. Pt may benefit from vestibular eval.  Suggesting OP PT to address balance.      PT Assessment  Patient needs continued PT services    Follow Up Recommendations  Outpatient PT    Barriers to Discharge None      Equipment Recommendations  None recommended by PT    Recommendations for Other Services     Frequency Min 3X/week    Precautions / Restrictions Precautions Precautions: Fall Precaution Comments: LOB with cervical rotation.  Restrictions Weight Bearing Restrictions: No   Pertinent Vitals/Pain No c/o pain.        Mobility  Bed Mobility Bed Mobility: Sit to Supine Supine to Sit: 7: Independent;HOB elevated Sitting - Scoot to Edge of Bed: 7: Independent Sit to Supine: 7: Independent Details for Bed Mobility Assistance: no deficits noted.  Transfers Transfers: Sit to Stand;Stand to [employer] Transfers Sit to Stand: 7: Independent;With upper extremity assist;Without upper extremity assist;From chair/3-in-1;From bed Stand to Sit: 7: Independent;With upper extremity assist;Without upper extremity assist;To bed;To chair/3-in-1;With armrests Stand Pivot Transfers: 7: Independent Details for Transfer Assistance: No assistance or instruction required.   Ambulation/Gait Ambulation/Gait Assistance: 7: Independent Ambulation Distance (Feet): 250 Feet Assistive device: None Ambulation/Gait Assistance Details: Initially pt felt unsteady touching wall for balance. After first 25 feet pt able to ambulate  without touching wall demonstrating safety and independence.  Gait Pattern: Within Functional Limits Gait velocity: WFL Stairs: Yes Stairs Assistance: 7: Independent Stair Management Technique: No rails;Forwards;Alternating pattern Number of Stairs: 2  Wheelchair Mobility Wheelchair Mobility: No Modified Rankin (Stroke Patients Only) Pre-Morbid Rankin Score: No symptoms Modified Rankin: No significant disability    Exercises     PT Diagnosis: Other (comment) (impaired balance. )  PT Problem List: Decreased balance PT Treatment Interventions: Balance training;Therapeutic activities;Neuromuscular re-education;Patient/family education   PT Goals Acute Rehab PT Goals PT Goal Formulation: With patient Time For Goal Achievement: 01/25/12 Potential to Achieve Goals: Good Additional Goals Additional Goal #1: Pt will score 24 on DGI (No LOB with cervical rotation).   PT Goal: Additional Goal #1 - Progress: Goal set today  Visit Information  Last PT Received On: 01/18/12 Assistance Needed: +1    Subjective Data  Subjective: agree to PT  Eval  Patient Stated Goal: Return to home independence.    Prior Functioning  Home Living Lives With: Spouse Available Help at Discharge: Available 24 hours/day Type of Home: House Home Access: Stairs to enter [employer] of Steps: 2 Entrance Stairs-Rails: None Home Layout: Two level;Bed/bath upstairs Alternate Level Stairs-Number of Steps: 15 Alternate Level Stairs-Rails: Right Bathroom Shower/Tub: Tub/shower unit;Curtain Bathroom Toilet: Standard Bathroom Accessibility: Yes How Accessible: Accessible via walker Home Adaptive Equipment: Straight cane Prior Function Level of Independence: Independent Able to Take Stairs?: Yes Driving: Yes Vocation: Full time employment Comments: sales [employer] Communication Communication: No difficulties Dominant Hand: Right    Cognition  Overall Cognitive Status: Appears within  functional limits for tasks assessed/performed Arousal/Alertness: Awake/alert Orientation Level: Appears intact for tasks assessed Behavior During Session: [facility] for tasks performed    Extremity/Trunk  Assessment Right Upper Extremity Assessment RUE ROM/Strength/Tone: Within functional levels RUE Sensation: WFL - Light Touch RUE Coordination: WFL - gross/fine motor Left Upper Extremity Assessment LUE ROM/Strength/Tone: Within functional levels LUE Sensation: WFL - Light Touch LUE Coordination: WFL - gross/fine motor Right Lower Extremity Assessment RLE ROM/Strength/Tone: WFL for tasks assessed;Deficits (Mild weakness noted. ) RLE ROM/Strength/Tone Deficits: Mild weakness in ankle DF and hip/knee extensors 4+/5 grossly.  Left Lower Extremity Assessment LLE ROM/Strength/Tone: Within functional levels Trunk Assessment Trunk Assessment: Normal   Balance Balance Balance Assessed: Yes Standardized Balance Assessment Standardized Balance Assessment: Dynamic Gait Index Dynamic Gait Index Level Surface: Normal Change in Gait Speed: Normal Gait with Horizontal Head Turns: Mild Impairment (Limited cervical rotation) Gait with Vertical Head Turns: Mild Impairment (Cervical ROM limited ) Gait and Pivot Turn: Normal Step Over Obstacle: Normal Step Around Obstacles: Normal Steps: Normal Total Score: 22  High Level Balance High Level Balance Activites: Turns;Head turns;Sudden stops High Level Balance Comments: Pt demonstrates limited cervical ROM when asked to turn head vertically or horizontally. No history of cervical impairment. Pt reports that if she turns her head it "throws off" her vision. Pt reports this to be an acute condition starting yesterday.    End of Session PT - End of Session Equipment Utilized During Treatment: Gait belt Activity Tolerance: Patient tolerated treatment well Patient left: in bed;with call bell/phone within reach Nurse Communication: Mobility status   GP Functional Assessment Tool Used: clinical judgement Functional Limitation: Mobility: Walking and moving around Mobility: Walking and Moving Around Current Status (Z6109): At least 1 percent but less than 20 percent impaired, limited or restricted Mobility: Walking and Moving Around Goal Status [phone]): 0 percent impaired, limited or restricted   Karyssa Amaral 01/18/2012, 11:12 AM Taylr Meuth L. Kindrick Lankford DPT [phone]

## 2012-01-18 NOTE — Progress Notes (Signed)
Subjective: Amy Dunn was admitted for brief episode of speech difficulty/aphasia. ED evaluation including CT head reveals meningioma but no acute changes. She was seen by neurology who was concerned for possible TIA and recommended MRI brain.  This Am she is feeling better: reports no recurrent leg weakness and no difficulty with speech.  Objective: Lab: Lab Results  Component Value Date   WBC 7.4 01/17/2012   HGB 11.6* 01/17/2012   HCT 31.8* 01/17/2012   MCV 77.2* 01/17/2012   PLT 199 01/17/2012   BMET    Component Value Date/Time   NA 138 01/17/2012 1135   K 3.9 01/17/2012 1135   CL 103 01/17/2012 1135   CO2 26 01/17/2012 1135   GLUCOSE 95 01/17/2012 1135   BUN 12 01/17/2012 1135   CREATININE 0.96 01/17/2012 1135   CALCIUM 9.4 01/17/2012 1135   GFRNONAA 57* 01/17/2012 1135   GFRAA 66* 01/17/2012 1135     Imaging: CT head w/ 3.1 cm tumor right parietal lobe - c/w meningioma.  Scheduled Meds:   . sodium chloride   Intravenous STAT  . aspirin  325 mg Oral Daily  . bimatoprost  1 drop Left Eye QHS  . dorzolamide-timolol  1 drop Right Eye BID  . loratadine  10 mg Oral Daily  . nebivolol  10 mg Oral Daily  . pantoprazole  40 mg Oral Q1200   Continuous Infusions:   . sodium chloride    . DISCONTD: sodium chloride 125 mL/hr at 01/17/12 1215   PRN Meds:.acetaminophen, traMADol   Physical Exam: Filed Vitals:   01/18/12 0500  BP: 157/91  Pulse: 62  Temp: 98.6 F (37 C)  Resp: 18  Awake and alert AA woman in no distress HEENT- C&S clear Cor - RRR PUlm - normal respirations Neuor - A&O x 3, speech clear, cognition is normal. CN II-XII appears to be normal.     Assessment/Plan: 1. Neuro - patient with transient episode of right leg weakness and aphasia now with all symptoms resolved. PLan For MRI brain  Plavix 75 mg daily  2. Brain tumor - new finding of 3.1 cm tumor right parietal area- no associated edema or shift. Most likely benign meningioma that will not require  any intervention but routine follow-up imaging.   Will see pt this PM after MRI   Illene Regulus [facility] (o) [phone]; [phone]  01/18/2012, 7:47 AM

## 2012-01-18 NOTE — Progress Notes (Signed)
Utilization review completed.  

## 2012-01-18 NOTE — Progress Notes (Signed)
*  PRELIMINARY RESULTS* Vascular Ultrasound Carotid Duplex (Doppler) has been completed.  Preliminary findings: Bilaterally no significant ICA stenosis with antegrade vertebral flow.  Farrel Demark, RDMS, RVT  01/18/2012, 11:24 AM

## 2012-01-18 NOTE — Progress Notes (Signed)
Studies negative. Will need follow up of pulmonary nodule Office follow - up 5-7 days.  Dictated [phone]

## 2012-01-18 NOTE — Discharge Summary (Signed)
[name], [name]:  [id number]  MEDICAL RECORD NO.:  [id number]  LOCATION:  3W05C                        FACILITY:  [facility]  PHYSICIAN:  Rosalyn Gess. Bular Hickok, MD  DATE OF BIRTH:  [birth year]  DATE OF ADMISSION:  01/17/2012 DATE OF DISCHARGE:  01/18/2012                              DISCHARGE SUMMARY   ADMITTING DIAGNOSES: 1. CVA with transient symptoms. 2. Hypertension. 3. Gastroesophageal reflux disease. 4. Stable pulmonary nodule. 5. Left hip osteoarthritis.  DISCHARGE DIAGNOSES: 1. CVA with transient symptoms. 2. Hypertension. 3. Gastroesophageal reflux disease. 4. Stable pulmonary nodule. 5. Left hip osteoarthritis.  PROCEDURES: 1. CT of the head without contrast performed on day of admission,     which showed a 3.2 cm high right frontal parietal lesion most     consistent with meningioma.  No significant mass effect or     surrounding vasogenic edema.  Moderate to marked and age advanced     small vessel ischemic changes were noted.  No acute intracranial     abnormality. 2. MRI brain with and without contrast which revealed a 3.2 x 2.9 x 3     cm well-defined extra-axial mass in the right parietal region most     consistent with a calcified meningioma.  No evidence of acute     ischemia.  Extensive confluent subcortical white matter changes     representing sequelae of chronic microvascular ischemia, probably     due to hypertension. 3. MRA which showed mild focal area of decreased caliber involving     left MCA inferior division and to a lesser degree, the right     posterior cerebral artery distally.  These most likely represent     intracranial arterial sclerotic changes given the patient's     history.  No evidence of intracranial aneurysm or occlusions or     dissections seen.  HISTORY OF PRESENT ILLNESS:  Amy Dunn is a delightful 71 year old woman followed for hypertension and GERD who presented with 2 episodes of right lower  extremity weakness with slurred speech and aphasia that occurred on the day of admission.  History was obtained from the patient, who stated that she was in the grocery store at approximately 10:30 a.m., when she experienced right lower extremity weakness while walking.  She came out of the grocery store and started holding on to things to avoid a fall.  She got to her car, was able to drive home, but could not get out of the car.  The patient also reports an episode of slurred speech, was not able to get her words out, though  she knew what she was thinking.  Her husband brought her to the emergency department. Her symptoms had resolved within 15 minutes.  In the emergency department, CT imaging 30 minutes later was unremarkable.  The patient had another repeat episode that lasted less than 5 minutes of aphasia. During both episodes, the patient states she had a burning sensation in her left eye, but no significant visual changes.  The patient was subsequently admitted for observation and further imaging studies.  The patient was found on CT to have a right frontal parietal  meningioma. Dr. Cristal Deer was consulted in the emergency department, did not recommend any acute neurosurgical intervention.  Neurology was consulted in the emergency department, recommended MR brain with further workup if MRI was positive.  Please see the H and P as well as previous epic records for past medical history, family history, social history, and medications at admission.  HOSPITAL COURSE:  The patient was admitted to the neuro unit.  She had no further episodes of slurred speech or weakness.  Her blood pressure remained stable.  The patient was seen by Occupational Therapy  and the patient was thought to require outpatient occupational therapy for balance deficits.  Physical therapy saw the patient and recommended the patient about balance deficits, could benefit from outpatient PT Services.  Vascular  lab was consulted and she did have carotid Dopplers, which showed no significant ICA stenosis.  Neurology saw the patient in follow up and recommended no additional testing.  With the patient having completed her evaluation with a 2D echo being done and results pending with the patient having a negative MRI scan, negative carotid Dopplers with no recurrent symptoms.  At this point, she is stable and ready for discharge to home and will be treated with antiplatelet agent.  DISCHARGE PHYSICAL EXAMINATION:  VITAL SIGNS:  Temperature was 98.1, blood pressure 151/84, pulse 71, respirations 16, O2 sats 98% on room air. GENERAL APPEARANCE:  A well-nourished, well-developed, African American woman, in no acute distress. HEENT:  Morning examination revealed HEENT exam to be unremarkable. CHEST:  Clear. CARDIOVASCULAR:  Revealed a regular rate and rhythm. NEUROLOGIC:  Patient is awake, alert.  She is oriented to person, place, time, and context.  She moves all extremities to command and has no focal deficits.  FINAL LABORATORY DATA:  The patient had a lipid panel with a cholesterol of 173, HDL was 44, and LDL was 103.  Chemistries at admission were normal with a BUN of 12 and creatinine 0.96.  Plasma glucose was 117. Liver functions were normal.  CBC was unremarkable with a white count of 7400, chronic anemia with a hemoglobin of 11.6 g, platelet count 199,000 with normal differential.  A1c was 5.7%.  Imaging studies as noted with carotid Doppler from preliminarily read as showing no ICA stenosis, 2D echo pending at time of discharge.  DISPOSITION:  Patient is discharged to home.  At this point, she is doing very well and we will hold off on ordering home physical therapy and OT until she is seen in the office in followup in 5-7 days.  DISCHARGE MEDICATIONS:  The patient will continue all of her home medications without change.  She will start taking Plavix 75 mg daily.  Discussed these  results with the patient and her family.  She is advised to report any significant neurologic symptoms or changes.  She will be seen as noted above in the office within 5-7 days.  The patient's condition at time of discharge dictation is neurologically and medically stable.     Rosalyn Gess Sonam Wandel, MD     MEN/MEDQ  D:  01/18/2012  T:  01/18/2012  Job:  [id number]

## 2012-01-19 ENCOUNTER — Telehealth: Payer: Self-pay | Admitting: Internal Medicine

## 2012-01-19 NOTE — Telephone Encounter (Signed)
Pt scheduled using the 11:30am slot. Thanks!

## 2012-01-19 NOTE — Telephone Encounter (Signed)
Message copied by Newell Coral on Tue Jan 19, 2012 10:56 AM ------      Message from: Illene Regulus E      Created: Mon Jan 18, 2012  7:15 PM       Hospital follow-up Monday. thanks

## 2012-01-20 ENCOUNTER — Telehealth: Payer: Self-pay

## 2012-01-20 DIAGNOSIS — I10 Essential (primary) hypertension: Secondary | ICD-10-CM

## 2012-01-20 NOTE — Telephone Encounter (Signed)
Pt's daughter called requesting MD advisement on whether pt needed to continue Benicar HCT after discharge. Medication was discontinued 08/18 but discharge report states pt is to continue all home medications, please advise.

## 2012-01-20 NOTE — Telephone Encounter (Signed)
Yes, please continue Benicar/Hct

## 2012-01-21 MED ORDER — OLMESARTAN MEDOXOMIL-HCTZ 40-25 MG PO TABS: 1.0000 | ORAL_TABLET | Freq: Every day | ORAL | Status: DC

## 2012-01-21 NOTE — Telephone Encounter (Signed)
Notified pt with md response. Pt states will need new rx will send to [facility]... 01/21/12@8 :57am/LMB

## 2012-01-25 ENCOUNTER — Ambulatory Visit (INDEPENDENT_AMBULATORY_CARE_PROVIDER_SITE_OTHER): Payer: Medicare Other | Admitting: Internal Medicine

## 2012-01-25 ENCOUNTER — Encounter: Payer: Self-pay | Admitting: Internal Medicine

## 2012-01-25 VITALS — BP 122/78 | HR 76 | Temp 98.6°F | Resp 16 | Wt 123.0 lb

## 2012-01-25 DIAGNOSIS — G459 Transient cerebral ischemic attack, unspecified: Secondary | ICD-10-CM

## 2012-01-25 MED ORDER — LOSARTAN POTASSIUM-HCTZ 100-25 MG PO TABS: 1.0000 | ORAL_TABLET | Freq: Every day | ORAL | Status: DC

## 2012-01-25 NOTE — Progress Notes (Signed)
Subjective:    Patient ID: Amy Dunn, female    DOB: 06-08-1940, 71 y.o.   MRN: [id number]  HPI Amy Dunn was recently hospitalized for possible TIA when she had two episodes of slurred speech and aphasia. She had a full work-up: CT brain -normal, MRI brain - normal, MRA brain - normal, carotid dopplers - normal, 2 D echo normal. Her symptoms rapidly cleared with no recurrence. She is aspirin intolerant and was sent home on plavix 75 mg daily. Since d/c she has done well.  Past Medical History  Diagnosis Date  . History of colonic polyps   . Acute peptic ulcer, unspecified site, with hemorrhage and perforation, without mention of obstruction   . Osteoarthritis of left shoulder   . Primary osteoarthritis of left hip   . Pulmonary nodule, left     Left Lower lobe  . GERD (gastroesophageal reflux disease)   . Cough   . Hypertension    Past Surgical History  Procedure Date  . Colon surgery   . Surgical repair of peptic ulcer    Family History  Problem Relation Age of Onset  . Hypertension Mother   . Heart disease Mother   . Heart attack Mother   . Cancer Father     Colon cancer   History   Social History  . Marital Status: Married    Spouse Name: N/A    Number of Children: N/A  . Years of Education: N/A   Occupational History  . Not on file.   Social History Main Topics  . Smoking status: Former Smoker -- 1 years    Types: Cigarettes    Quit date: 05/26/1981  . Smokeless tobacco: Never Used   Comment: 2 ciggs per day  . Alcohol Use: No  . Drug Use: No  . Sexually Active: Not on file   Other Topics Concern  . Not on file   Social History Narrative   HSGMarried- '622 daughters- '65, '70; 3 grandchildrenOccupation retired- mfg inspectorEnd of life: No CPR, no intubation, no heroic measures (pt provided end of life packet)    Current Outpatient Prescriptions on File Prior to Visit  Medication Sig Dispense Refill  . bimatoprost (LUMIGAN) 0.03 % ophthalmic  solution Place 1 drop into the left eye at bedtime.      . clopidogrel (PLAVIX) 75 MG tablet Take 1 tablet (75 mg total) by mouth daily with breakfast.  30 tablet  11  . dorzolamide-timolol (COSOPT) 22.3-6.8 MG/ML ophthalmic solution Place 1 drop into the right eye 2 (two) times daily.      . fexofenadine (ALLEGRA) 180 MG tablet Take 180 mg by mouth daily as needed. For allergies      . Multiple Vitamins-Minerals (ALIVE ONCE DAILY WOMENS 50+) TABS Take 1 tablet by mouth daily.       . nebivolol (BYSTOLIC) 10 MG tablet Take 1 tablet (10 mg total) by mouth daily.  30 tablet  5  . omeprazole-sodium bicarbonate (ZEGERID) 40-1100 MG per capsule Take 1 capsule by mouth daily before breakfast.        . traMADol (ULTRAM) 50 MG tablet Take 1 tablet (50 mg total) by mouth every 6 (six) hours as needed for pain.  100 tablet  2  . losartan-hydrochlorothiazide (HYZAAR) 100-25 MG per tablet Take 1 tablet by mouth daily.  90 tablet  3      Review of Systems System review is negative for any constitutional, cardiac, pulmonary, GI or neuro symptoms or complaints other  than as described in the HPI.     Objective:   Physical Exam Filed Vitals:   01/25/12 1214  BP: 122/78  Pulse: 76  Temp: 98.6 F (37 C)  Resp: 16   Gen'l- WNWD AA woman Cor- RRR PUlm - normal respirations Neuro - A&O x 3, speech clear, cognition normal, CN II-XII normal, cerebellar - normal gait and station, no tremor, normal tandem gait       Assessment & Plan:

## 2012-01-25 NOTE — Assessment & Plan Note (Signed)
August '13 - transient slurred speech and aphasia. Negative CT, MRI, MRA, carotid dopplers, 2D echo. Doing well with no recurrence. Tolerating plavix.  Plan Continue plavix and risk reduction, i.e. BP control, cholesterol control.

## 2012-01-27 ENCOUNTER — Encounter (INDEPENDENT_AMBULATORY_CARE_PROVIDER_SITE_OTHER): Payer: Medicare Other | Admitting: Ophthalmology

## 2012-01-27 DIAGNOSIS — H348392 Tributary (branch) retinal vein occlusion, unspecified eye, stable: Secondary | ICD-10-CM

## 2012-01-27 DIAGNOSIS — H2510 Age-related nuclear cataract, unspecified eye: Secondary | ICD-10-CM

## 2012-01-27 DIAGNOSIS — H43819 Vitreous degeneration, unspecified eye: Secondary | ICD-10-CM

## 2012-01-27 DIAGNOSIS — I10 Essential (primary) hypertension: Secondary | ICD-10-CM

## 2012-01-27 DIAGNOSIS — H35039 Hypertensive retinopathy, unspecified eye: Secondary | ICD-10-CM

## 2012-05-01 ENCOUNTER — Encounter: Payer: Self-pay | Admitting: Internal Medicine

## 2012-05-01 ENCOUNTER — Other Ambulatory Visit: Payer: Self-pay | Admitting: Internal Medicine

## 2012-05-18 ENCOUNTER — Ambulatory Visit (INDEPENDENT_AMBULATORY_CARE_PROVIDER_SITE_OTHER): Payer: Medicare Other | Admitting: Internal Medicine

## 2012-05-18 ENCOUNTER — Encounter: Payer: Self-pay | Admitting: Internal Medicine

## 2012-05-18 VITALS — BP 122/80 | HR 70 | Temp 100.0°F | Resp 10 | Wt 122.0 lb

## 2012-05-18 DIAGNOSIS — G459 Transient cerebral ischemic attack, unspecified: Secondary | ICD-10-CM

## 2012-05-18 DIAGNOSIS — I10 Essential (primary) hypertension: Secondary | ICD-10-CM

## 2012-05-18 MED ORDER — TEMAZEPAM 15 MG PO CAPS: 15.0000 mg | ORAL_CAPSULE | Freq: Every evening | ORAL | Status: DC | PRN

## 2012-05-18 MED ORDER — ATENOLOL 50 MG PO TABS: 50.0000 mg | ORAL_TABLET | Freq: Every day | ORAL | Status: DC

## 2012-05-18 NOTE — Patient Instructions (Addendum)
Thanks for coming in. You look great and your neurologic exam is normal. Your Blood pressure is FAMULOUS!!  For tinnitus - try using a white noise generator at night. For sleep when you need it - trial of temazepam 15 mg at bedtime.  For better cost on BP medication will switch to Atenolol 50 mg ( a generic product) once a day.   Flu shot today.

## 2012-05-18 NOTE — Progress Notes (Signed)
Subjective:    Patient ID: Amy Dunn, female    DOB: 1941/04/03, 71 y.o.   MRN: [id number]  HPI Amy Dunn presents for follow up. She was last seen 3 months ago in follow up for TIA with a negative work -up. She has continued on plavix and has no recurrent events. She has had very good BP control. She is feeling well in general. She would like a flu shot today.   Past Medical History  Diagnosis Date  . History of colonic polyps   . Acute peptic ulcer, unspecified site, with hemorrhage and perforation, without mention of obstruction   . Osteoarthritis of left shoulder   . Primary osteoarthritis of left hip   . Pulmonary nodule, left     Left Lower lobe  . GERD (gastroesophageal reflux disease)   . Cough   . Hypertension    Past Surgical History  Procedure Date  . Colon surgery   . Surgical repair of peptic ulcer    Family History  Problem Relation Age of Onset  . Hypertension Mother   . Heart disease Mother   . Heart attack Mother   . Cancer Father     Colon cancer   History   Social History  . Marital Status: Married    Spouse Name: N/A    Number of Children: N/A  . Years of Education: N/A   Occupational History  . Not on file.   Social History Main Topics  . Smoking status: Former Smoker -- 1 years    Types: Cigarettes    Quit date: 05/26/1981  . Smokeless tobacco: Never Used     Comment: 2 ciggs per day  . Alcohol Use: No  . Drug Use: No  . Sexually Active: Not on file   Other Topics Concern  . Not on file   Social History Narrative   HSGMarried- '622 daughters- '65, '70; 3 grandchildrenOccupation retired- mfg inspectorEnd of life: No CPR, no intubation, no heroic measures (pt provided end of life packet)    Current Outpatient Prescriptions on File Prior to Visit  Medication Sig Dispense Refill  . bimatoprost (LUMIGAN) 0.03 % ophthalmic solution Place 1 drop into the left eye at bedtime.      . clopidogrel (PLAVIX) 75 MG tablet Take 1 tablet (75 mg  total) by mouth daily with breakfast.  30 tablet  11  . dorzolamide-timolol (COSOPT) 22.3-6.8 MG/ML ophthalmic solution Place 1 drop into the right eye 2 (two) times daily.      . fexofenadine (ALLEGRA) 180 MG tablet Take 180 mg by mouth daily as needed. For allergies      . losartan-hydrochlorothiazide (HYZAAR) 100-25 MG per tablet Take 1 tablet by mouth daily.  90 tablet  3  . Multiple Vitamins-Minerals (ALIVE ONCE DAILY WOMENS 50+) TABS Take 1 tablet by mouth daily.       . nebivolol (BYSTOLIC) 10 MG tablet Take 1 tablet (10 mg total) by mouth daily.  30 tablet  5  . omeprazole-sodium bicarbonate (ZEGERID) 40-1100 MG per capsule Take 1 capsule by mouth daily before breakfast.        . traMADol (ULTRAM) 50 MG tablet Take 1 tablet (50 mg total) by mouth every 6 (six) hours as needed for pain.  100 tablet  2      Review of Systems System review is negative for any constitutional, cardiac, pulmonary, GI or neuro symptoms or complaints other than as described in the HPI.     Objective:  Physical Exam Filed Vitals:   05/18/12 1040  BP: 122/80  Pulse: 70  Temp: 100 F (37.8 C)  Resp: 10   gen'l- WNWD AA woman in no distress HEENT- C&S clear, PERRLA Cor - 2+ radial, RRR Pulm - CTAP Neuro - CN II-XII normal with normal facial symmetry, shoulder shrug, PERRLA, EOMI. MAE. No tremor.        Assessment & Plan:

## 2012-05-19 NOTE — Assessment & Plan Note (Signed)
Continues to do very well.   Plan Continue Plavix for full year  Continue risk factor modification

## 2012-05-19 NOTE — Assessment & Plan Note (Signed)
BP Readings from Last 3 Encounters:  05/18/12 122/80  01/25/12 122/78  01/18/12 151/84   Good control on present medications

## 2012-05-30 ENCOUNTER — Ambulatory Visit: Payer: Medicare Other | Admitting: Internal Medicine

## 2012-06-06 ENCOUNTER — Ambulatory Visit (INDEPENDENT_AMBULATORY_CARE_PROVIDER_SITE_OTHER): Payer: Medicare Other | Admitting: Ophthalmology

## 2012-06-06 DIAGNOSIS — H43819 Vitreous degeneration, unspecified eye: Secondary | ICD-10-CM

## 2012-06-06 DIAGNOSIS — H35039 Hypertensive retinopathy, unspecified eye: Secondary | ICD-10-CM

## 2012-06-06 DIAGNOSIS — H2510 Age-related nuclear cataract, unspecified eye: Secondary | ICD-10-CM

## 2012-06-06 DIAGNOSIS — H348392 Tributary (branch) retinal vein occlusion, unspecified eye, stable: Secondary | ICD-10-CM

## 2012-06-06 DIAGNOSIS — I10 Essential (primary) hypertension: Secondary | ICD-10-CM

## 2012-08-17 ENCOUNTER — Other Ambulatory Visit: Payer: Self-pay | Admitting: Internal Medicine

## 2012-08-24 ENCOUNTER — Telehealth: Payer: Self-pay

## 2012-08-24 NOTE — Telephone Encounter (Signed)
Message copied by Noreene Larsson on Wed Aug 24, 2012 10:40 AM ------      Message from: Illene Regulus E      Created: Wed Aug 24, 2012  9:38 AM       Remind her of CT scan scheduled for April 10th Thanks ------

## 2012-08-24 NOTE — Telephone Encounter (Signed)
Pt called and reminded of her CT appt April 10 at 11:00 and advised to arrive 15 min prior to appt. She expressed understanding and did not have any questions or concerns.

## 2012-09-08 ENCOUNTER — Ambulatory Visit (INDEPENDENT_AMBULATORY_CARE_PROVIDER_SITE_OTHER)
Admission: RE | Admit: 2012-09-08 | Discharge: 2012-09-08 | Disposition: A | Discharge status: Home / Self Care | Payer: Medicare Other | Source: Ambulatory Visit | Attending: Pulmonary Disease | Admitting: Pulmonary Disease

## 2012-09-08 DIAGNOSIS — J984 Other disorders of lung: Secondary | ICD-10-CM

## 2012-09-13 NOTE — Progress Notes (Signed)
Quick Note:  Called spoke with patient, advised of CT results / recs as stated by RA. Pt verbalized her understanding and denied any questions and will keep 4.22.14 ov w/ RA to discuss in detail. ______

## 2012-09-20 ENCOUNTER — Encounter: Payer: Self-pay | Admitting: Pulmonary Disease

## 2012-09-20 ENCOUNTER — Ambulatory Visit (INDEPENDENT_AMBULATORY_CARE_PROVIDER_SITE_OTHER): Payer: Medicare Other | Admitting: Pulmonary Disease

## 2012-09-20 VITALS — BP 110/60 | HR 70 | Temp 98.9°F | Ht 65.0 in | Wt 123.4 lb

## 2012-09-20 DIAGNOSIS — J984 Other disorders of lung: Secondary | ICD-10-CM

## 2012-09-20 NOTE — Assessment & Plan Note (Signed)
Stable from feb '11 to 2/13, slight increase in 2014 5mm in 2006 CT  Suspect BAC Discussed wait & watch approach vs aggressive biopsy/surgery - she prefers former FU CT chest in 62m PFts

## 2012-09-20 NOTE — Patient Instructions (Signed)
CT chest in 2m Breathing test

## 2012-09-20 NOTE — Progress Notes (Signed)
  Subjective:    Patient ID: Amy Dunn, female    DOB: 1940/06/08, 72 y.o.   MRN: [id number]  HPI PCP - Norins   71/F, ex smoker for FU of -chronic cough since 3/10 attributed to upper airway cough/ GERD  - LLL nodule on CT feb 2011 -stable in feb'12 , not seen on CT abd feb 2006  She sees Dr Kinnie Scales for GERD & was put on zegerid. LES was dilated  - After CT abdomens 3/13 showed -Air and fluid within dilated distal esophagus.? gastroesophageal reflux or distal esophageal stricture  She smoked for 25 Pyrs before quitting in 1980.  Spirometry showed flattening of expiratory limb but no evidence of airway obstruction.  Cough not much changed with trial of 1st gen anti-H, back on claritin D. Did not respond to trial of reglan. Cough was improved on regimen of nasal steroid, zegerid & claritin daily x 1 month  cough controlled on codeine , comes back if she stops.  3/11 allergy panel nml.    09/20/2012  Pt states she only coughs occasionally, continues on zegerid. Denies any wheezing, chest tx, SOB. Pt has no complaints today. Been doing well overall.  CT chest 08/2012 slight increase in size of sub solid nodule within the left lower lobe compared with 07/28/2011. This has clearly increased in size when compared with exam from 2006. 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 Also denies presyncope, palpitations, heartburn, abdominal pain, nausea, vomiting, diarrhea or change in bowel or urinary habits, dysuria,hematuria, rash, arthralgias, visual complaints, headache, numbness weakness or ataxia.     Objective:   Physical Exam  Gen. Pleasant,  well-nourished, in no distress ENT - no lesions, no post nasal drip Neck: No JVD, no thyromegaly, no carotid bruits Lungs: no use of accessory muscles, no dullness to percussion, clear without rales or rhonchi  Cardiovascular: Rhythm regular, heart sounds  normal, no murmurs or gallops, no peripheral edema Musculoskeletal: No deformities, no cyanosis or clubbing         Assessment & Plan:

## 2012-10-10 ENCOUNTER — Encounter: Payer: Self-pay | Admitting: Internal Medicine

## 2012-10-10 ENCOUNTER — Ambulatory Visit (INDEPENDENT_AMBULATORY_CARE_PROVIDER_SITE_OTHER): Payer: Medicare Other | Admitting: Internal Medicine

## 2012-10-10 VITALS — BP 130/72 | HR 79 | Temp 99.5°F | Wt 122.8 lb

## 2012-10-10 DIAGNOSIS — J309 Allergic rhinitis, unspecified: Secondary | ICD-10-CM

## 2012-10-10 MED ORDER — FLUTICASONE PROPIONATE 50 MCG/ACT NA SUSP: 2.0000 | Freq: Every day | NASAL | Status: DC

## 2012-10-10 NOTE — Progress Notes (Signed)
  Subjective:    Patient ID: Amy Dunn, female    DOB: 30-Jun-1940, 72 y.o.   MRN: [id number]  HPI  See PA student note and CC  Past Medical History  Diagnosis Date  . History of colonic polyps   . Acute peptic ulcer, unspecified site, with hemorrhage and perforation, without mention of obstruction   . Osteoarthritis of left shoulder   . Primary osteoarthritis of left hip   . Pulmonary nodule, left     Left Lower lobe  . GERD (gastroesophageal reflux disease)   . Cough   . Hypertension     Review of Systems  Constitutional: Positive for fatigue. Negative for fever.  HENT: Positive for congestion, postnasal drip and sinus pressure. Negative for nosebleeds and rhinorrhea.   Neurological: Negative for headaches.       Objective:   Physical Exam BP 130/72  Pulse 79  Temp(Src) 99.5 F (37.5 C) (Oral)  Wt 122 lb 12.8 oz (55.702 kg)  BMI 20.44 kg/m2  SpO2 97% Constitutional: She appears well-developed and well-nourished. No distress.  HENT: Head: Normocephalic and atraumatic. Mild sinus pressure to palpation. Ears: B TMs ok, no erythema or effusion; Nose: pale swollen turbinates, no thick or discolored DC. Mouth/Throat: Oropharynx is clear and moist. No oropharyngeal exudate.  Eyes: Conjunctivae and EOM are normal. Pupils are equal, round, and reactive to light. No scleral icterus.  Neck: Normal range of motion. Neck supple. No JVD present. No thyromegaly present.  Cardiovascular: Normal rate, regular rhythm and normal heart sounds.  No murmur heard. No BLE edema. Pulmonary/Chest: Effort normal and breath sounds normal. No respiratory distress. She has no wheezes. Psychiatric: She has a normal mood and affect. Her behavior is normal. Judgment and thought content normal.   Lab Results  Component Value Date   WBC 7.4 01/17/2012   HGB 11.6* 01/17/2012   HCT 31.8* 01/17/2012   PLT 199 01/17/2012   GLUCOSE 95 01/17/2012   CHOL 173 01/18/2012   TRIG 130 01/18/2012   HDL 44 01/18/2012    LDLDIRECT 100.8 03/18/2010   LDLCALC 103* 01/18/2012   ALT 10 01/17/2012   AST 15 01/17/2012   NA 138 01/17/2012   K 3.9 01/17/2012   CL 103 01/17/2012   CREATININE 0.96 01/17/2012   BUN 12 01/17/2012   CO2 26 01/17/2012   TSH 0.66 10/22/2011   HGBA1C 5.7* 01/17/2012        Assessment & Plan:   See problem list. Medications and labs reviewed today.

## 2012-10-10 NOTE — Progress Notes (Signed)
Subjective:    Patient ID: Amy Dunn, female    DOB: 1940/09/22, 72 y.o.   MRN: [id number]  HPI  Patient presents to the office today with "sinus" problems.  Patient states that she has a cough which is associated with congestion, sneezing, frontal sinus headache, clear nasal discharge, and scratchy throat.  She has tried Allegra D and Afrin at home with some relief.  She has no aggravating factors identified at this time.  She denies sick contacts.  She states that she has taken Nasonex in the past with relief of similar symptoms.    Past Medical History  Diagnosis Date  . History of colonic polyps   . Acute peptic ulcer, unspecified site, with hemorrhage and perforation, without mention of obstruction   . Osteoarthritis of left shoulder   . Primary osteoarthritis of left hip   . Pulmonary nodule, left     Left Lower lobe  . GERD (gastroesophageal reflux disease)   . Cough   . Hypertension     Family History  Problem Relation Age of Onset  . Hypertension Mother   . Heart disease Mother   . Heart attack Mother   . Cancer Father     Colon cancer      Review of Systems  HENT: Positive for congestion, sore throat, rhinorrhea, sneezing, postnasal drip and sinus pressure. Negative for hearing loss, ear pain, trouble swallowing and voice change.   Eyes: Negative for discharge and itching.  Respiratory: Positive for cough. Negative for chest tightness and shortness of breath.   Cardiovascular: Negative for chest pain.  Neurological: Positive for headaches. Negative for dizziness and light-headedness.  All other systems reviewed and are negative.       Objective:   Physical Exam  Nursing note and vitals reviewed. Constitutional: She is oriented to person, place, and time. She appears well-developed and well-nourished. No distress.  HENT:  Head: Normocephalic and atraumatic.  Right Ear: External ear normal. No drainage or tenderness. Tympanic membrane is not injected and not  erythematous. A middle ear effusion is present. No decreased hearing is noted.  Left Ear: External ear normal. No drainage or tenderness. Tympanic membrane is not injected and not erythematous. A middle ear effusion is present. No decreased hearing is noted.  Nose: Mucosal edema present.  Mouth/Throat: No posterior oropharyngeal erythema.  Eyes: Conjunctivae are normal. No scleral icterus.  Neck: Normal range of motion. Neck supple.  Cardiovascular: Normal rate, regular rhythm and normal heart sounds.  Exam reveals no gallop and no friction rub.   No murmur heard. Pulmonary/Chest: Effort normal and breath sounds normal. No respiratory distress. She has no wheezes. She has no rales. She exhibits no tenderness.  Musculoskeletal: Normal range of motion.  Lymphadenopathy:    She has no cervical adenopathy.  Neurological: She is alert and oriented to person, place, and time.  Skin: Skin is warm and dry. She is not diaphoretic.  Psychiatric: She has a normal mood and affect. Her behavior is normal.   Filed Vitals:   10/10/12 1617  BP: 130/72  Pulse: 79  Temp: 99.5 F (37.5 C)  TempSrc: Oral  Weight: 122 lb 12.8 oz (55.702 kg)  SpO2: 97%   Lab Results  Component Value Date   WBC 7.4 01/17/2012   HGB 11.6* 01/17/2012   HCT 31.8* 01/17/2012   PLT 199 01/17/2012   GLUCOSE 95 01/17/2012   CHOL 173 01/18/2012   TRIG 130 01/18/2012   HDL 44 01/18/2012   LDLDIRECT  100.8 03/18/2010   LDLCALC 103* 01/18/2012   ALT 10 01/17/2012   AST 15 01/17/2012   NA 138 01/17/2012   K 3.9 01/17/2012   CL 103 01/17/2012   CREATININE 0.96 01/17/2012   BUN 12 01/17/2012   CO2 26 01/17/2012   TSH 0.66 10/22/2011   HGBA1C 5.7* 01/17/2012          Assessment & Plan:  Patient presents to the office complaining of "sinus" problems.  Presentation and exam consistent with allergic rhinitis/sinusitis.  Will prescribe flonase for mucosal edema of the nose.  Patient instructed to continue to use daily oral antihistamine.   Use of antibiotics discussed at this time and patient was told that they would get no benefit for them at this time.    Patient given strict return precautions.  Patient will call back in 1 week if symptoms have not resolved or sooner if they worsen.        I have personally reviewed this case with PA student. I also personally examined this patient. I agree with history and findings as documented above. I reviewed, discussed and approve of the assessment and plan as listed above. Rene Paci, MD\

## 2012-10-10 NOTE — Patient Instructions (Signed)
It was good to see you today. If you develop worsening symptoms or fever, call and we can reconsider antibiotics, but it does not appear necessary to use antibiotics at this time. Start Flonase allergy and sinus spray once daily each nostril - Your prescription(s) have been submitted to your pharmacy. Please take as directed and contact our office if you believe you are having problem(s) with the medication(s). Continue Allegra for allergy symptoms Please call if worse or unimproved  Allergic Rhinitis Allergic rhinitis is when the mucous membranes in the nose respond to allergens. Allergens are particles in the air that cause your body to have an allergic reaction. This causes you to release allergic antibodies. Through a chain of events, these eventually cause you to release histamine into the blood stream (hence the use of antihistamines). Although meant to be protective to the body, it is this release that causes your discomfort, such as frequent sneezing, congestion and an itchy runny nose.  CAUSES  The pollen allergens may come from grasses, trees, and weeds. This is seasonal allergic rhinitis, or "hay fever." Other allergens cause year-round allergic rhinitis (perennial allergic rhinitis) such as house dust mite allergen, pet dander and mold spores.  SYMPTOMS   Nasal stuffiness (congestion).  Runny, itchy nose with sneezing and tearing of the eyes.  There is often an itching of the mouth, eyes and ears. It cannot be cured, but it can be controlled with medications. DIAGNOSIS  If you are unable to determine the offending allergen, skin or blood testing may find it. TREATMENT   Avoid the allergen.  Medications and allergy shots (immunotherapy) can help.  Hay fever may often be treated with antihistamines in pill or nasal spray forms. Antihistamines block the effects of histamine. There are over-the-counter medicines that may help with nasal congestion and swelling around the eyes. Check  with your caregiver before taking or giving this medicine. If the treatment above does not work, there are many new medications your caregiver can prescribe. Stronger medications may be used if initial measures are ineffective. Desensitizing injections can be used if medications and avoidance fails. Desensitization is when a patient is given ongoing shots until the body becomes less sensitive to the allergen. Make sure you follow up with your caregiver if problems continue. SEEK MEDICAL CARE IF:   You develop fever (more than 100.5 F (38.1 C).  You develop a cough that does not stop easily (persistent).  You have shortness of breath.  You start wheezing.  Symptoms interfere with normal daily activities. Document Released: 02/10/2001 Document Revised: 08/10/2011 Document Reviewed: 08/22/2008 [facility] Patient Information 2013 [place], [place]. Sinusitis Sinusitis is redness, soreness, and swelling (inflammation) of the paranasal sinuses. Paranasal sinuses are air pockets within the bones of your face (beneath the eyes, the middle of the forehead, or above the eyes). In healthy paranasal sinuses, mucus is able to drain out, and air is able to circulate through them by way of your nose. However, when your paranasal sinuses are inflamed, mucus and air can become trapped. This can allow bacteria and other germs to grow and cause infection. Sinusitis can develop quickly and last only a short time (acute) or continue over a long period (chronic). Sinusitis that lasts for more than 12 weeks is considered chronic.  CAUSES  Causes of sinusitis include:  Allergies.  Structural abnormalities, such as displacement of the cartilage that separates your nostrils (deviated septum), which can decrease the air flow through your nose and sinuses and affect sinus drainage.  Functional abnormalities, such as when the small hairs (cilia) that line your sinuses and help remove mucus do not work properly or are not  present. SYMPTOMS  Symptoms of acute and chronic sinusitis are the same. The primary symptoms are pain and pressure around the affected sinuses. Other symptoms include:  Upper toothache.  Earache.  Headache.  Bad breath.  Decreased sense of smell and taste.  A cough, which worsens when you are lying flat.  Fatigue.  Fever.  Thick drainage from your nose, which often is green and may contain pus (purulent).  Swelling and warmth over the affected sinuses. DIAGNOSIS  Your caregiver will perform a physical exam. During the exam, your caregiver may:  Look in your nose for signs of abnormal growths in your nostrils (nasal polyps).  Tap over the affected sinus to check for signs of infection.  View the inside of your sinuses (endoscopy) with a special imaging device with a light attached (endoscope), which is inserted into your sinuses. If your caregiver suspects that you have chronic sinusitis, one or more of the following tests may be recommended:  Allergy tests.  Nasal culture A sample of mucus is taken from your nose and sent to a lab and screened for bacteria.  Nasal cytology A sample of mucus is taken from your nose and examined by your caregiver to determine if your sinusitis is related to an allergy. TREATMENT  Most cases of acute sinusitis are related to a viral infection and will resolve on their own within 10 days. Sometimes medicines are prescribed to help relieve symptoms (pain medicine, decongestants, nasal steroid sprays, or saline sprays).  However, for sinusitis related to a bacterial infection, your caregiver will prescribe antibiotic medicines. These are medicines that will help kill the bacteria causing the infection.  Rarely, sinusitis is caused by a fungal infection. In theses cases, your caregiver will prescribe antifungal medicine. For some cases of chronic sinusitis, surgery is needed. Generally, these are cases in which sinusitis recurs more than 3 times  per year, despite other treatments. HOME CARE INSTRUCTIONS   Drink plenty of water. Water helps thin the mucus so your sinuses can drain more easily.  Use a humidifier.  Inhale steam 3 to 4 times a day (for example, sit in the bathroom with the shower running).  Apply a warm, moist washcloth to your face 3 to 4 times a day, or as directed by your caregiver.  Use saline nasal sprays to help moisten and clean your sinuses.  Take over-the-counter or prescription medicines for pain, discomfort, or fever only as directed by your caregiver. SEEK IMMEDIATE MEDICAL CARE IF:  You have increasing pain or severe headaches.  You have nausea, vomiting, or drowsiness.  You have swelling around your face.  You have vision problems.  You have a stiff neck.  You have difficulty breathing. MAKE SURE YOU:   Understand these instructions.  Will watch your condition.  Will get help right away if you are not doing well or get worse. Document Released: 05/18/2005 Document Revised: 08/10/2011 Document Reviewed: 06/02/2011 [employer] Patient Information 2013 [place], [place].

## 2012-10-10 NOTE — Assessment & Plan Note (Signed)
Acute seasonal sinusitis symptoms History of same, previously responded well to nasal steroid Begin Flonase now, continue daily oral antihistamine such as Allegra Explained lack of evidence for antibiotics in nonifectious disease, patient understands and agrees to call if symptoms worse

## 2012-11-03 ENCOUNTER — Other Ambulatory Visit: Payer: Medicare Other

## 2012-11-03 ENCOUNTER — Encounter: Payer: Self-pay | Admitting: Internal Medicine

## 2012-11-03 ENCOUNTER — Ambulatory Visit (INDEPENDENT_AMBULATORY_CARE_PROVIDER_SITE_OTHER): Payer: Medicare Other | Admitting: Internal Medicine

## 2012-11-03 VITALS — BP 100/70 | HR 72 | Temp 98.0°F | Ht 65.0 in | Wt 123.0 lb

## 2012-11-03 DIAGNOSIS — J984 Other disorders of lung: Secondary | ICD-10-CM

## 2012-11-03 DIAGNOSIS — I10 Essential (primary) hypertension: Secondary | ICD-10-CM

## 2012-11-03 DIAGNOSIS — D320 Benign neoplasm of cerebral meninges: Secondary | ICD-10-CM | POA: Insufficient documentation

## 2012-11-03 DIAGNOSIS — G459 Transient cerebral ischemic attack, unspecified: Secondary | ICD-10-CM

## 2012-11-03 LAB — COMPREHENSIVE METABOLIC PANEL
Albumin: 3.8 g/dL (ref 3.5–5.2)
Alkaline Phosphatase: 77 U/L (ref 39–117)
BUN: 16 mg/dL (ref 6–23)
CO2: 31 mEq/L (ref 19–32)
Calcium: 9.1 mg/dL (ref 8.4–10.5)
Chloride: 99 mEq/L (ref 96–112)
GFR: 52.41 mL/min — ABNORMAL LOW (ref >60.00)
Glucose, Bld: 98 mg/dL (ref 70–99)
Potassium: 3.7 mEq/L (ref 3.5–5.1)
Sodium: 136 mEq/L (ref 135–145)
Total Protein: 7.4 g/dL (ref 6.0–8.3)

## 2012-11-03 MED ORDER — OMEPRAZOLE 20 MG PO CPDR: 20.0000 mg | DELAYED_RELEASE_CAPSULE | Freq: Every day | ORAL | Status: DC

## 2012-11-03 NOTE — Progress Notes (Signed)
Subjective:    Patient ID: Amy Dunn, female    DOB: 1940/06/14, 72 y.o.   MRN: [id number]  HPI Amy Dunn was seen May 12 for allergic rhinits by Dr. Felicity Coyer. She was given flonase and continued allegra and has done well.  She has seen Dr. Vassie Loll: she has a pulmonary nodule that has increased in size sine '06 to 4/14 and she is to have PFTs and then bronchoscopy with biopsy.  She reports that she has been feeling low on energy with any specific complaint. She is able to do all her ADLs.  Past Medical History  Diagnosis Date  . History of colonic polyps   . Acute peptic ulcer, unspecified site, with hemorrhage and perforation, without mention of obstruction   . Osteoarthritis of left shoulder   . Primary osteoarthritis of left hip   . Pulmonary nodule, left     Left Lower lobe  . GERD (gastroesophageal reflux disease)   . Cough   . Hypertension    Past Surgical History  Procedure Laterality Date  . Colon surgery    . Surgical repair of peptic ulcer     Family History  Problem Relation Age of Onset  . Hypertension Mother   . Heart disease Mother   . Heart attack Mother   . Cancer Father     Colon cancer   History   Social History  . Marital Status: Married    Spouse Name: N/A    Number of Children: N/A  . Years of Education: N/A   Occupational History  . Not on file.   Social History Main Topics  . Smoking status: Former Smoker -- 1 years    Types: Cigarettes    Quit date: 05/26/1981  . Smokeless tobacco: Never Used     Comment: 2 ciggs per day  . Alcohol Use: No  . Drug Use: No  . Sexually Active: Not on file   Other Topics Concern  . 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 atenolol (TENORMIN) 50 MG tablet Take 1 tablet (50 mg total) by mouth daily.  30 tablet  11  . bimatoprost (LUMIGAN) 0.03 % ophthalmic solution Place 1 drop into the left eye at bedtime.      . clopidogrel (PLAVIX) 75 MG tablet Take 1 tablet (75 mg total) by mouth daily with breakfast.  30 tablet  11  . dorzolamide-timolol (COSOPT) 22.3-6.8 MG/ML ophthalmic solution Place 1 drop into the right eye 2 (two) times daily.      . fexofenadine (ALLEGRA) 180 MG tablet Take 180 mg by mouth daily as needed. For allergies      . fluticasone (FLONASE) 50 MCG/ACT nasal spray Place 2 sprays into the nose daily.  16 g  2  . losartan-hydrochlorothiazide (HYZAAR) 100-25 MG per tablet Take 1 tablet by mouth daily.  90 tablet  3  . Multiple Vitamins-Minerals (ALIVE ONCE DAILY WOMENS 50+) TABS Take 1 tablet by mouth daily.       . temazepam (RESTORIL) 15 MG capsule Take 1 capsule (15 mg total) by mouth at bedtime as needed for sleep.  30 capsule  1  . traMADol (  ULTRAM) 50 MG tablet take 1 tablet by mouth every 6 hours if needed for pain  100 tablet  2   No current facility-administered medications on file prior to visit.      Review of Systems Constitutional:  Negative for fever, chills, activity change and unexpected weight change.  HEENT:  Negative for hearing loss, ear pain, congestion, neck stiffness and postnasal drip. Negative for sore throat or swallowing problems. Negative for dental complaints.   Eyes: Negative for vision loss or change in visual acuity.  Respiratory: Negative for chest tightness and wheezing. Negative for DOE.   Cardiovascular: Negative for chest pain or palpitations. No decreased exercise tolerance Gastrointestinal: No change in bowel habit. No bloating or gas. No reflux or indigestion Genitourinary: Negative for urgency, frequency, flank pain and difficulty urinating.  Musculoskeletal: Negative for myalgias, back pain, arthralgias and gait problem.  Neurological: Negative for  dizziness, tremors, weakness and headaches.  Hematological: Negative for adenopathy.  Psychiatric/Behavioral: Negative for behavioral problems and dysphoric mood.       Objective:   Physical Exam Filed Vitals:   11/03/12 1513  BP: 100/70  Pulse: 72  Temp: 98 F (36.7 C)   Gen'l - slender AA woman in no distress HEENT - C&S clear Neck - supple Nodes - negative Cor 2+ radial, RRR w/o murmur Pulm - normal respirations, lungs CTAP Neuro - A&O x 3, cognition normal, normal strenght, normal gait.   Recent Results (from the past 2160 hour(s))  COMPREHENSIVE METABOLIC PANEL     Status: Abnormal   Collection Time    11/03/12  4:09 PM      Result Value Range   Sodium 136  135 - 145 mEq/L   Potassium 3.7  3.5 - 5.1 mEq/L   Chloride 99  96 - 112 mEq/L   CO2 31  19 - 32 mEq/L   Glucose, Bld 98  70 - 99 mg/dL   BUN 16  6 - 23 mg/dL   Creatinine, Ser 1.3 (*) 0.4 - 1.2 mg/dL   Total Bilirubin 0.6  0.3 - 1.2 mg/dL   Alkaline Phosphatase 77  39 - 117 U/L   AST 21  0 - 37 U/L   ALT 19  0 - 35 U/L   Total Protein 7.4  6.0 - 8.3 g/dL   Albumin 3.8  3.5 - 5.2 g/dL   Calcium 9.1  8.4 - 78.2 mg/dL   GFR 95.62 (*) >13.08 mL/min        Assessment & Plan:

## 2012-11-03 NOTE — Patient Instructions (Addendum)
Good to see you.  Feeling draggy, low on energy. Two possible causes - tramadol and having a blood pressure that is a little low. Also, if you are having pain after eating on a regular basis you should be taking medication to lower acid, e.g. Omeprazole (prilosec) 20 mg every AM before breakfast.  With blood pressure running a little low please change the Hyzaar to 1/2 tablet daily. If this does a good job at the lower dose we can prescribe the lower dose when you run out of your current prescription.  For stroke prevention - I recommend that you continue Plavix for a full year and then we can consider stopping plavix and start taking 325 mg aspirin daily. 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  Pulmonary nodule - per Dr. Vassie Loll - for pulmonary function studies and biopsy of the lung nodule which has increased in size slightly between '06 and April '14  Lab today to check kidney and electrolytes.  Please sign up for MyChart.

## 2012-11-04 ENCOUNTER — Encounter: Payer: Self-pay | Admitting: Internal Medicine

## 2012-11-06 NOTE — Assessment & Plan Note (Signed)
Patient is asymptotic - reviewed MRI report with her and offered reassurance.  Plan Repeat/follow-up CT brain after Aug '14

## 2012-11-06 NOTE — Assessment & Plan Note (Signed)
No recurrent symptoms.  Plan Aspirin 325 mg once a day - to start after bronchoscopy - for prevention recurrent TIA/CVA  Continue HTN management

## 2012-11-06 NOTE — Assessment & Plan Note (Signed)
Solitary pulmonary nodule followed by Dr. Vassie Loll - due for PFTs and then bronchoscopy with biopsy due to enlargement in nodule size.

## 2012-11-06 NOTE — Assessment & Plan Note (Signed)
BP Readings from Last 3 Encounters:  11/03/12 100/70  10/10/12 130/72  09/20/12 110/60   Very good control if not a little low at today's reading.  Plan Continue present medications  BP monitoring at home - report back for SBP that remains 100 or less.

## 2012-11-08 ENCOUNTER — Ambulatory Visit (INDEPENDENT_AMBULATORY_CARE_PROVIDER_SITE_OTHER)
Admission: RE | Admit: 2012-11-08 | Discharge: 2012-11-08 | Disposition: A | Discharge status: Home / Self Care | Payer: Medicare Other | Source: Ambulatory Visit | Attending: Internal Medicine | Admitting: Internal Medicine

## 2012-11-08 DIAGNOSIS — D320 Benign neoplasm of cerebral meninges: Secondary | ICD-10-CM

## 2012-11-11 ENCOUNTER — Encounter: Payer: Self-pay | Admitting: Internal Medicine

## 2012-11-17 ENCOUNTER — Ambulatory Visit: Payer: Medicare Other | Admitting: Internal Medicine

## 2012-12-05 ENCOUNTER — Ambulatory Visit (INDEPENDENT_AMBULATORY_CARE_PROVIDER_SITE_OTHER): Payer: Medicare Other | Admitting: Ophthalmology

## 2012-12-06 ENCOUNTER — Ambulatory Visit (INDEPENDENT_AMBULATORY_CARE_PROVIDER_SITE_OTHER): Payer: Medicare Other | Admitting: Ophthalmology

## 2012-12-06 DIAGNOSIS — H35039 Hypertensive retinopathy, unspecified eye: Secondary | ICD-10-CM

## 2012-12-06 DIAGNOSIS — H43819 Vitreous degeneration, unspecified eye: Secondary | ICD-10-CM

## 2012-12-06 DIAGNOSIS — H348392 Tributary (branch) retinal vein occlusion, unspecified eye, stable: Secondary | ICD-10-CM

## 2012-12-06 DIAGNOSIS — I10 Essential (primary) hypertension: Secondary | ICD-10-CM

## 2013-03-14 ENCOUNTER — Ambulatory Visit (INDEPENDENT_AMBULATORY_CARE_PROVIDER_SITE_OTHER): Payer: Medicare Other | Admitting: Internal Medicine

## 2013-03-14 ENCOUNTER — Encounter: Payer: Self-pay | Admitting: Internal Medicine

## 2013-03-14 VITALS — BP 100/62 | HR 68 | Temp 98.3°F | Wt 126.0 lb

## 2013-03-14 DIAGNOSIS — J309 Allergic rhinitis, unspecified: Secondary | ICD-10-CM

## 2013-03-14 DIAGNOSIS — Z23 Encounter for immunization: Secondary | ICD-10-CM

## 2013-03-14 DIAGNOSIS — Z0000 Encounter for general adult medical examination without abnormal findings: Secondary | ICD-10-CM | POA: Insufficient documentation

## 2013-03-14 DIAGNOSIS — I10 Essential (primary) hypertension: Secondary | ICD-10-CM

## 2013-03-14 NOTE — Assessment & Plan Note (Signed)
Will give flu shot today. Patient was instructed to return for a nursing appointment in a few weeks for Prevnar 13.

## 2013-03-14 NOTE — Progress Notes (Signed)
Subjective:     Patient ID: Amy Dunn, female   DOB: March 11, 1941, 72 y.o.   MRN: [id number]  HPI Amy Dunn presents with a week long history of dull "sinus" 3/10 HA that occur over Amy forehead. These have been occuring every evening this week and last until she falls asleep. She also has them on occasion when waking up in the morning. They do not wake Amy up at night. She takes tylenol which helps some. She also occasionally has a sharper pain. She has had intermittent sore throat. She takes allegra for allergies. No congestion. No night sweats or chills. Also over the past week she has been coughing at night, productive of white mucus. No blood in sputum. No SOB, no myalgias. She takes flonase which doesn't help much.   Amy Dunn recently has sinusitis and was treated with flonase, cough syrup, azithromycin last week.   Review of Systems General: No fevers. No changes in weight.  HEENT: Left eye occasionally itchy and red. Takes antihistamine eye drops for this. Last saw optho 2 weeks ago for cataract removal.  CV: No chest pain.  Pulm: No chest tightness.  Abdominal: No changes in appetite, no N/V/D.      Objective:   Physical Exam Filed Vitals:   03/14/13 1454  BP: 100/62  Pulse: 68  Temp: 98.3 F (36.8 C)   General: Well appearing woman sitting in the exam room in NAD.  CV: RRR Pulm: CTAB, no wheezes or crackles.  HEENT: TMs clear bilaterally. Mild tenderness to palpation in the left frontal sinus. Erythema of nasal turbinates. No erythema or exudates in the oropharynx.    Current Outpatient Prescriptions on File Prior to Visit  Medication Sig Dispense Refill  . atenolol (TENORMIN) 50 MG tablet Take 1 tablet (50 mg total) by mouth daily.  30 tablet  11  . bimatoprost (LUMIGAN) 0.03 % ophthalmic solution Place 1 drop into the left eye at bedtime.      . dorzolamide-timolol (COSOPT) 22.3-6.8 MG/ML ophthalmic solution Place 1 drop into the right eye 2 (two) times daily.      .  fexofenadine (ALLEGRA) 180 MG tablet Take 180 mg by mouth daily as needed. For allergies      . fluticasone (FLONASE) 50 MCG/ACT nasal spray Place 2 sprays into the nose daily.  16 g  2  . Multiple Vitamins-Minerals (ALIVE ONCE DAILY WOMENS 50+) TABS Take 1 tablet by mouth daily.       Marland Kitchen omeprazole (PRILOSEC) 20 MG capsule Take 1 capsule (20 mg total) by mouth daily.  30 capsule  3  . temazepam (RESTORIL) 15 MG capsule Take 1 capsule (15 mg total) by mouth at bedtime as needed for sleep.  30 capsule  1  . traMADol (ULTRAM) 50 MG tablet take 1 tablet by mouth every 6 hours if needed for pain  100 tablet  2  . losartan-hydrochlorothiazide (HYZAAR) 100-25 MG per tablet Take 1 tablet by mouth daily.  90 tablet  3   No current facility-administered medications on file prior to visit.      Assessment:     Amy Dunn is 72 year old woman with a history of allergic rhinitis who presents with a week long history of a mild 3/10 dull frontal HA and some evening coughing productive of white sputum. On exam, lungs are CTAB, there is no fever, and there is mild left frontal sinus tenderness to palpation.     Plan:     See  plan by problem list.

## 2013-03-14 NOTE — Assessment & Plan Note (Signed)
Oct '14: Patient's presentation with a dull frontal headache, mild left sinus tenderness to palpation, an intermittent sore throat, and evening cough productive of clear sputum with no fever, malaise, or SOB is most consistent with continued allergic rhinitis.   Plan::  --Continue flonase 1-2 times daily during allergy season.  --Continue to take allegra.  --Take a low dose (30mg ) or pseudoephedrine as needed for sinus congestion.

## 2013-03-14 NOTE — Patient Instructions (Signed)
Allergic rhinitis:  Plan:   -Continue to take allegra, 180 mg daily.   -Continue flonase spray 1-2 times a day during allergy  season. You can take it 3 times a week when your allergies  are better controlled.   - For sinus pressure, take 30 mg of pseudoephedrine once or  twice daily.    Make a nursing appointment in a few weeks to receive the Prevnar vaccine.    Allergic Rhinitis Allergic rhinitis is when the mucous membranes in the nose respond to allergens. Allergens are particles in the air that cause your body to have an allergic reaction. This causes you to release allergic antibodies. Through a chain of events, these eventually cause you to release histamine into the blood stream (hence the use of antihistamines). Although meant to be protective to the body, it is this release that causes your discomfort, such as frequent sneezing, congestion and an itchy runny nose.  CAUSES  The pollen allergens may come from grasses, trees, and weeds. This is seasonal allergic rhinitis, or "hay fever." Other allergens cause year-round allergic rhinitis (perennial allergic rhinitis) such as house dust mite allergen, pet dander and mold spores.  SYMPTOMS   Nasal stuffiness (congestion).  Runny, itchy nose with sneezing and tearing of the eyes.  There is often an itching of the mouth, eyes and ears. It cannot be cured, but it can be controlled with medications. DIAGNOSIS  If you are unable to determine the offending allergen, skin or blood testing may find it. TREATMENT   Avoid the allergen.  Medications and allergy shots (immunotherapy) can help.  Hay fever may often be treated with antihistamines in pill or nasal spray forms. Antihistamines block the effects of histamine. There are over-the-counter medicines that may help with nasal congestion and swelling around the eyes. Check with your caregiver before taking or giving this medicine. If the treatment above does not work, there are many new  medications your caregiver can prescribe. Stronger medications may be used if initial measures are ineffective. Desensitizing injections can be used if medications and avoidance fails. Desensitization is when a patient is given ongoing shots until the body becomes less sensitive to the allergen. Make sure you follow up with your caregiver if problems continue. SEEK MEDICAL CARE IF:   You develop fever (more than 100.5 F (38.1 C).  You develop a cough that does not stop easily (persistent).  You have shortness of breath.  You start wheezing.  Symptoms interfere with normal daily activities. Document Released: 02/10/2001 Document Revised: 08/10/2011 Document Reviewed: 08/22/2008 [facility] Patient Information 2014 [place], [place].

## 2013-03-15 ENCOUNTER — Other Ambulatory Visit (HOSPITAL_COMMUNITY): Payer: Self-pay | Admitting: Internal Medicine

## 2013-03-15 ENCOUNTER — Other Ambulatory Visit: Payer: Self-pay | Admitting: Internal Medicine

## 2013-03-15 NOTE — Telephone Encounter (Signed)
This med is not on current med list. Should she be on this?

## 2013-03-24 ENCOUNTER — Ambulatory Visit (INDEPENDENT_AMBULATORY_CARE_PROVIDER_SITE_OTHER): Payer: Medicare Other | Admitting: Pulmonary Disease

## 2013-03-24 ENCOUNTER — Encounter: Payer: Self-pay | Admitting: Pulmonary Disease

## 2013-03-24 VITALS — BP 120/60 | HR 61 | Temp 97.1°F | Ht 64.0 in | Wt 126.6 lb

## 2013-03-24 DIAGNOSIS — R053 Chronic cough: Secondary | ICD-10-CM | POA: Insufficient documentation

## 2013-03-24 DIAGNOSIS — R059 Cough, unspecified: Secondary | ICD-10-CM

## 2013-03-24 DIAGNOSIS — J984 Other disorders of lung: Secondary | ICD-10-CM

## 2013-03-24 DIAGNOSIS — R05 Cough: Secondary | ICD-10-CM

## 2013-03-24 MED ORDER — PROMETHAZINE-CODEINE 6.25-10 MG/5ML PO SYRP: 5.0000 mL | ORAL_SOLUTION | Freq: Every day | ORAL | Status: DC

## 2013-03-24 NOTE — Progress Notes (Signed)
  Subjective:    Patient ID: Amy Dunn, female    DOB: 12-09-1940, 72 y.o.   MRN: [id number]  HPI  PCP - Norins   72/F, ex smoker for FU of -chronic cough since 3/10 attributed to upper airway cough/ GERD  - LLL nodule , first notedon CT feb 2011 ( not seen on CT abd feb 2006 ) -stable x2 y, then slight growth in 2014 She sees Dr Kinnie Scales for GERD & was put on zegerid. LES was dilated - After CT abdomens 3/13 showed -Air and fluid within dilated distal esophagus.? gastroesophageal reflux or distal esophageal stricture  She smoked for 25 Pyrs before quitting in 1980.  Spirometry showed flattening of expiratory limb but no evidence of airway obstruction.  Cough not much changed with trial of 1st gen anti-H, back on claritin D. Did not respond to trial of reglan. Cough was improved on regimen of nasal steroid, zegerid & claritin daily x 1 month  cough controlled on codeine , comes back if she stops.  3/11 allergy panel nml.      03/24/2013  Chief Complaint  Patient presents with  . Follow-up    Pt c/o productive cough w/ white phlem. Pt reports going on x couple weeks. Denies any SOB, no wheezing, no chest tx. Takes mucinex BID. Tried robitussin but no relief.    Cough returned 1 mnth ago - prilosec did not work, back on zegerid but taking it as needed only. Robitussin, mucinex has not helped CT chest 08/2012 slight increase in size of sub solid nodule within the left lower lobe compared with 07/28/2011. This has clearly increased in size when compared with exam from 2006. The solid component measures less than 5 mm  Her husband had a URI around the same time but got better Review of Systems neg for any significant sore throat, dysphagia, itching, sneezing, nasal congestion or excess/ purulent secretions, fever, chills, sweats, unintended wt loss, pleuritic or exertional cp, hempoptysis, orthopnea pnd or change in chronic leg swelling. Also denies presyncope, palpitations, heartburn,  abdominal pain, nausea, vomiting, diarrhea or change in bowel or urinary habits, dysuria,hematuria, rash, arthralgias, visual complaints, headache, numbness weakness or ataxia.     Objective:   Physical Exam  Gen. Pleasant, well-nourished, in no distress ENT - no lesions, no post nasal drip Neck: No JVD, no thyromegaly, no carotid bruits Lungs: no use of accessory muscles, no dullness to percussion, clear without rales or rhonchi  Cardiovascular: Rhythm regular, heart sounds  normal, no murmurs or gallops, no peripheral edema Musculoskeletal: No deformities, no cyanosis or clubbing        Assessment & Plan:

## 2013-03-24 NOTE — Patient Instructions (Signed)
Stay on zegerid daily Take ZYRTEC daily x 23month Codeine -promethazine syrup 5ml at bedtime x 100 ml OK to take DELSYm daytime for cough CT chest & breathing Test (PFT)  before next visit

## 2013-03-24 NOTE — Assessment & Plan Note (Signed)
Combination of GERD & upper airway cough -improved on regimen of nasal steroid, zegerid & claritin daily in the past   Stay on zegerid daily Take ZYRTEC daily x 54month Codeine -promethazine syrup 5ml at bedtime x 100 ml OK to take DELSYm daytime for cough

## 2013-03-24 NOTE — Assessment & Plan Note (Signed)
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 8m fu CT chest & breathing Test (PFT)  before next visit

## 2013-03-29 ENCOUNTER — Ambulatory Visit (INDEPENDENT_AMBULATORY_CARE_PROVIDER_SITE_OTHER)
Admission: RE | Admit: 2013-03-29 | Discharge: 2013-03-29 | Disposition: A | Discharge status: Home / Self Care | Payer: Medicare Other | Source: Ambulatory Visit | Attending: Pulmonary Disease | Admitting: Pulmonary Disease

## 2013-03-29 DIAGNOSIS — J984 Other disorders of lung: Secondary | ICD-10-CM

## 2013-04-13 ENCOUNTER — Ambulatory Visit (INDEPENDENT_AMBULATORY_CARE_PROVIDER_SITE_OTHER): Payer: Medicare Other | Admitting: Internal Medicine

## 2013-04-13 ENCOUNTER — Encounter: Payer: Self-pay | Admitting: Internal Medicine

## 2013-04-13 VITALS — BP 138/76 | HR 71 | Temp 99.1°F | Wt 126.8 lb

## 2013-04-13 DIAGNOSIS — T148XXA Other injury of unspecified body region, initial encounter: Secondary | ICD-10-CM

## 2013-04-13 NOTE — Patient Instructions (Signed)
Lesion on the leg - you have a bruise on the medial aspect of the right thigh. This is NOT a blood clot, not a bit, not an abscess or sign of infection. You are on the Plavix, a good blood thinner, so you may bruise from some very slight insult that you would not even notice.   Plan No treatment - this will get better on it's own.

## 2013-04-13 NOTE — Progress Notes (Signed)
  Subjective:    Patient ID: Amy Dunn, female    DOB: [birth year], 72 y.o.   MRN: [id number]  HPI Amy Dunn presents with a red spot on the right leg for about a week. Not painful. She does not recall any injury. She does take "blood thinner."  PMH, FamHx and SocHx reviewed for any changes and relevance.  Current Outpatient Prescriptions on File Prior to Visit  Medication Sig Dispense Refill  . atenolol (TENORMIN) 50 MG tablet Take 1 tablet (50 mg total) by mouth daily.  30 tablet  11  . bimatoprost (LUMIGAN) 0.03 % ophthalmic solution Place 1 drop into the left eye at bedtime.      . clopidogrel (PLAVIX) 75 MG tablet take 1 tablet by mouth once daily WITH BREAKFAST  30 tablet  11  . dorzolamide-timolol (COSOPT) 22.3-6.8 MG/ML ophthalmic solution Place 1 drop into the right eye 2 (two) times daily.      . fexofenadine (ALLEGRA) 180 MG tablet Take 180 mg by mouth daily as needed. For allergies      . fluticasone (FLONASE) 50 MCG/ACT nasal spray Place 2 sprays into the nose daily.  16 g  2  . guaiFENesin (MUCINEX) 600 MG 12 hr tablet Take 600 mg by mouth 2 (two) times daily.      . Multiple Vitamins-Minerals (ALIVE ONCE DAILY WOMENS 50+) TABS Take 1 tablet by mouth daily.       Marland Kitchen omeprazole (PRILOSEC) 20 MG capsule Take 1 capsule (20 mg total) by mouth daily.  30 capsule  3  . promethazine-codeine (PHENERGAN WITH CODEINE) 6.25-10 MG/5ML syrup Take 5 mLs by mouth at bedtime.  100 mL  0  . temazepam (RESTORIL) 15 MG capsule Take 1 capsule (15 mg total) by mouth at bedtime as needed for sleep.  30 capsule  1  . traMADol (ULTRAM) 50 MG tablet take 1 tablet by mouth every 6 hours if needed  100 tablet  2  . losartan-hydrochlorothiazide (HYZAAR) 100-25 MG per tablet Take 1 tablet by mouth daily.  90 tablet  3   No current facility-administered medications on file prior to visit.     Review of Systems System review is negative for any constitutional, cardiac, pulmonary, GI or neuro symptoms or  complaints other than as described in the HPI.     Objective:   Physical Exam Filed Vitals:   04/13/13 1141  BP: 138/76  Pulse: 71  Temp: 99.1 F (37.3 C)   BP Readings from Last 3 Encounters:  04/13/13 138/76  03/24/13 120/60  03/14/13 100/62   Gen'l- WNWD woman in no distress Cor - RRR Pulm - normal respirations Derm- circular bruise inner aspect right thigh. No palpable hematoma or lesion.       Assessment & Plan:  Bruise - minor bruise right leg. Suspect she had some slight blow to the leg. Not a clot!!

## 2013-04-13 NOTE — Progress Notes (Signed)
Pre visit review using our clinic review tool, if applicable. No additional management support is needed unless otherwise documented below in the visit note. 

## 2013-05-02 ENCOUNTER — Encounter: Payer: Self-pay | Admitting: Pulmonary Disease

## 2013-05-02 ENCOUNTER — Ambulatory Visit (INDEPENDENT_AMBULATORY_CARE_PROVIDER_SITE_OTHER): Payer: Medicare Other | Admitting: Pulmonary Disease

## 2013-05-02 VITALS — BP 110/64 | HR 69 | Temp 98.8°F | Ht 63.0 in | Wt 125.0 lb

## 2013-05-02 DIAGNOSIS — J984 Other disorders of lung: Secondary | ICD-10-CM

## 2013-05-02 NOTE — Assessment & Plan Note (Signed)
Stable from April to now PFTs adequate but she prefers a wait & watch approach - I explained that this could be slow growing BAC - if any further growth will proceed with lobectomy Proceed with PET in may 2015

## 2013-05-02 NOTE — Patient Instructions (Signed)
CT chest in may 2015

## 2013-05-02 NOTE — Progress Notes (Signed)
   Subjective:    Patient ID: Amy Dunn, female    DOB: [birth year], 72 y.o.   MRN: [id number]  HPI  PCP - Norins   72/F, ex smoker for FU of -chronic cough since 3/10 attributed to upper airway cough/ GERD  - LLL nodule , first noted on CT feb 2011 ( not seen on CT abd feb 2006 ) -stable x2 y, then slight growth in 2014  She sees Dr Kinnie Scales for GERD & was put on zegerid. LES was dilated - After CT abdomen 3/13 showed -Air and fluid within dilated distal esophagus.? gastroesophageal reflux or distal esophageal stricture  She smoked for 25 Pyrs before quitting in 1980.  Spirometry showed flattening of expiratory limb but no evidence of airway obstruction.  Cough not much changed with trial of 1st gen anti-H, back on claritin D. Did not respond to trial of reglan.-  improved on regimen of nasal steroid, zegerid & claritin daily x 1 month  cough controlled on codeine , comes back if she stops.  3/11 allergy panel nml.    05/02/2013  CT chest 08/2012 slight increase in size of sub solid nodule within the left lower lobe compared with 07/28/2011. This has clearly increased in size when compared with exam from 2006. The solid component measures less than 5 mm  Stable from April to oct 2014  Chief Complaint  Patient presents with  . Follow-up    w/ PFT. Pt reports her cough is about 75% better. Denies any wheezing, no chest tx, no SOB.     PFTs - no airway obstruction -mild restriction, TLC 80%, DLCO 70%  Review of Systems neg for any significant sore throat, dysphagia, itching, sneezing, nasal congestion or excess/ purulent secretions, fever, chills, sweats, unintended wt loss, pleuritic or exertional cp, hempoptysis, orthopnea pnd or change in chronic leg swelling. Also denies presyncope, palpitations, heartburn, abdominal pain, nausea, vomiting, diarrhea or change in bowel or urinary habits, dysuria,hematuria, rash, arthralgias, visual complaints, headache, numbness weakness or ataxia.    Objective:   Physical Exam  Gen. Pleasant, well-nourished, in no distress ENT - no lesions, no post nasal drip Neck: No JVD, no thyromegaly, no carotid bruits Lungs: no use of accessory muscles, no dullness to percussion, clear without rales or rhonchi  Cardiovascular: Rhythm regular, heart sounds  normal, no murmurs or gallops, no peripheral edema Musculoskeletal: No deformities, no cyanosis or clubbing        Assessment & Plan:

## 2013-05-02 NOTE — Progress Notes (Signed)
PFT done today. 

## 2013-05-29 ENCOUNTER — Other Ambulatory Visit: Payer: Self-pay | Admitting: Internal Medicine

## 2013-06-01 DIAGNOSIS — C3492 Malignant neoplasm of unspecified part of left bronchus or lung: Secondary | ICD-10-CM

## 2013-06-01 HISTORY — DX: Malignant neoplasm of unspecified part of left bronchus or lung: C34.92

## 2013-06-22 ENCOUNTER — Encounter: Payer: Self-pay | Admitting: Internal Medicine

## 2013-06-22 ENCOUNTER — Ambulatory Visit (INDEPENDENT_AMBULATORY_CARE_PROVIDER_SITE_OTHER): Payer: Medicare Other | Admitting: Internal Medicine

## 2013-06-22 VITALS — BP 130/86 | HR 73 | Temp 99.9°F | Wt 126.8 lb

## 2013-06-22 DIAGNOSIS — B9789 Other viral agents as the cause of diseases classified elsewhere: Principal | ICD-10-CM

## 2013-06-22 DIAGNOSIS — J069 Acute upper respiratory infection, unspecified: Secondary | ICD-10-CM

## 2013-06-22 NOTE — Progress Notes (Signed)
Amy Dunn [id number] 06/22/2013   Chief Complaint  Patient presents with  . Headache  . Cough  . Nasal Congestion    Subjective  Headache  This is a new problem. The current episode started in the past 7 days. The problem occurs constantly. The problem has been unchanged. The pain is located in the occipital and frontal region. The pain does not radiate. The quality of the pain is described as aching and squeezing. The pain is mild. Associated symptoms include coughing, eye pain, rhinorrhea, sinus pressure and a sore throat. Pertinent negatives include no abdominal pain, abnormal behavior, anorexia, blurred vision, dizziness, drainage, ear pain, eye redness, fever, hearing loss, loss of balance, muscle aches, nausea, numbness, swollen glands, visual change, vomiting, weakness or weight loss. Nothing aggravates the symptoms. She has tried NSAIDs for the symptoms. The treatment provided mild relief.  Cough This is a chronic problem. The current episode started in the past 7 days. The problem has been unchanged. The cough is non-productive. Associated symptoms include headaches, rhinorrhea and a sore throat. Pertinent negatives include no chest pain, chills, ear pain, eye redness, fever, hemoptysis, weight loss or wheezing. The symptoms are aggravated by cold air and dust. She has tried nothing for the symptoms. The treatment provided mild relief.    Past Medical History  Diagnosis Date  . History of colonic polyps   . Acute peptic ulcer, unspecified site, with hemorrhage and perforation, without mention of obstruction   . Osteoarthritis of left shoulder   . Primary osteoarthritis of left hip   . Pulmonary nodule, left     Left Lower lobe  . GERD (gastroesophageal reflux disease)   . Cough   . Hypertension     Past Surgical History  Procedure Laterality Date  . Colon surgery    . Surgical repair of peptic ulcer      Family History  Problem Relation Age of Onset  . Hypertension  Mother   . Heart disease Mother   . Heart attack Mother   . Cancer Father     Colon cancer    History  Substance Use Topics  . Smoking status: Former Smoker -- 1 years    Types: Cigarettes    Quit date: 05/26/1981  . Smokeless tobacco: Never Used     Comment: 2 ciggs per day  . Alcohol Use: No    Current Outpatient Prescriptions on File Prior to Visit  Medication Sig Dispense Refill  . atenolol (TENORMIN) 50 MG tablet take 1 tablet by mouth once daily  30 tablet  11  . bimatoprost (LUMIGAN) 0.03 % ophthalmic solution Place 1 drop into the left eye at bedtime.      . clopidogrel (PLAVIX) 75 MG tablet take 1 tablet by mouth once daily WITH BREAKFAST  30 tablet  11  . dorzolamide-timolol (COSOPT) 22.3-6.8 MG/ML ophthalmic solution Place 1 drop into the right eye 2 (two) times daily.      . fexofenadine (ALLEGRA) 180 MG tablet Take 180 mg by mouth daily as needed. For allergies      . fluticasone (FLONASE) 50 MCG/ACT nasal spray Place 2 sprays into the nose daily.  16 g  2  . guaiFENesin (MUCINEX) 600 MG 12 hr tablet Take 600 mg by mouth 2 (two) times daily as needed.       . Multiple Vitamins-Minerals (ALIVE ONCE DAILY WOMENS 50+) TABS Take 1 tablet by mouth daily.       Marland Kitchen omeprazole (PRILOSEC) 20 MG capsule  Take 1 capsule (20 mg total) by mouth daily.  30 capsule  3  . promethazine-codeine (PHENERGAN WITH CODEINE) 6.25-10 MG/5ML syrup Take 5 mLs by mouth at bedtime.  100 mL  0  . temazepam (RESTORIL) 15 MG capsule Take 1 capsule (15 mg total) by mouth at bedtime as needed for sleep.  30 capsule  1  . traMADol (ULTRAM) 50 MG tablet take 1 tablet by mouth every 6 hours if needed  100 tablet  2  . losartan-hydrochlorothiazide (HYZAAR) 100-25 MG per tablet Take 1 tablet by mouth daily.  90 tablet  3   No current facility-administered medications on file prior to visit.    Allergies:  Allergies  Allergen Reactions  . Amlodipine     Trouble swallowing  . Penicillins     REACTION:  itching    Review of Systems  Constitutional: Negative for fever, chills, weight loss and malaise/fatigue.  HENT: Positive for congestion, rhinorrhea, sinus pressure and sore throat. Negative for ear discharge, ear pain, hearing loss and nosebleeds.   Eyes: Positive for pain. Negative for blurred vision, double vision, discharge and redness.  Respiratory: Positive for cough. Negative for hemoptysis, sputum production and wheezing.   Cardiovascular: Negative for chest pain and palpitations.  Gastrointestinal: Negative for nausea, vomiting, abdominal pain, diarrhea and anorexia.  Neurological: Positive for headaches. Negative for dizziness, weakness, numbness and loss of balance.       Objective  Filed Vitals:   06/22/13 0909  BP: 130/86  Pulse: 73  Temp: 99.9 F (37.7 C)  TempSrc: Oral  Weight: 126 lb 12.8 oz (57.516 kg)  SpO2: 97%    Physical Exam  Vitals reviewed. Constitutional: She is oriented to person, place, and time. She appears well-developed and well-nourished. No distress.  HENT:  Head: Normocephalic.  Right Ear: External ear normal.  Left Ear: External ear normal.  Nose: Rhinorrhea present. Right sinus exhibits maxillary sinus tenderness and frontal sinus tenderness. Left sinus exhibits maxillary sinus tenderness and frontal sinus tenderness.  Mouth/Throat: No oropharyngeal exudate.  Eyes: Conjunctivae are normal. Pupils are equal, round, and reactive to light.  Neck: Normal range of motion. No JVD present.  Cardiovascular: Normal rate, regular rhythm and normal heart sounds.  Exam reveals no gallop and no friction rub.   No murmur heard. Respiratory: Effort normal and breath sounds normal. No stridor. No respiratory distress. She has no wheezes. She has no rales. She exhibits no tenderness.  Lymphadenopathy:    She has no cervical adenopathy.  Neurological: She is alert and oriented to person, place, and time.  Psychiatric: She has a normal mood and affect.  Her behavior is normal. Thought content normal.    BP Readings from Last 3 Encounters:  06/22/13 130/86  05/02/13 110/64  04/13/13 138/76    Wt Readings from Last 3 Encounters:  06/22/13 126 lb 12.8 oz (57.516 kg)  05/02/13 125 lb (56.7 kg)  04/13/13 126 lb 12.8 oz (57.516 kg)    Lab Results  Component Value Date   WBC 7.4 01/17/2012   HGB 11.6* 01/17/2012   HCT 31.8* 01/17/2012   PLT 199 01/17/2012   GLUCOSE 98 11/03/2012   CHOL 173 01/18/2012   TRIG 130 01/18/2012   HDL 44 01/18/2012   LDLDIRECT 100.8 03/18/2010   LDLCALC 103* 01/18/2012   ALT 19 11/03/2012   AST 21 11/03/2012   NA 136 11/03/2012   K 3.7 11/03/2012   CL 99 11/03/2012   CREATININE 1.3* 11/03/2012   BUN 16  11/03/2012   CO2 31 11/03/2012   TSH 0.66 10/22/2011   HGBA1C 5.7* 01/17/2012     Assessment and Plan  Viral Sinusitis - Treated symptomatically,   Instructed patient to continue the flonase, nasal saline, Tylenol 1,000 mg 3x times a day for fever and aches.  Instructed patient on alarming symptoms, such as change in mucus color or an increase in fever to 100 or higher.     Patient was instructed to notify the office if any new symptoms emerged or the current symptoms become worse. Donna Christen    Patient seen and examined. Agree with assessment and treatment as outlined above.,

## 2013-06-22 NOTE — Progress Notes (Signed)
Pre visit review using our clinic review tool, if applicable. No additional management support is needed unless otherwise documented below in the visit note. 

## 2013-06-22 NOTE — Patient Instructions (Signed)
Thanks for coming to see [place].,  You have a viral upper respiratory infection which antibiotic will not help.  Plan supportive care:  Continue the Flonase  Use the nasal saline  Use the stove top vaporizer.  Tylenol 1,000 mg three times a day for fever and aches  OK to take sudafed (generic) 30 mg twice a day.  Call for persistent fever 100 degrees or higher, a change in mucus to colored and bad tasting or shortness of breath.    Upper Respiratory Infection, Adult An upper respiratory infection (URI) is also sometimes known as the common cold. The upper respiratory tract includes the nose, sinuses, throat, trachea, and bronchi. Bronchi are the airways leading to the lungs. Most people improve within 1 week, but symptoms can last up to 2 weeks. A residual cough may last even longer.  CAUSES Many different viruses can infect the tissues lining the upper respiratory tract. The tissues become irritated and inflamed and often become very moist. Mucus production is also common. A cold is contagious. You can easily spread the virus to others by oral contact. This includes kissing, sharing a glass, coughing, or sneezing. Touching your mouth or nose and then touching a surface, which is then touched by another person, can also spread the virus. SYMPTOMS  Symptoms typically develop 1 to 3 days after you come in contact with a cold virus. Symptoms vary from person to person. They may include:  Runny nose.  Sneezing.  Nasal congestion.  Sinus irritation.  Sore throat.  Loss of voice (laryngitis).  Cough.  Fatigue.  Muscle aches.  Loss of appetite.  Headache.  Low-grade fever. DIAGNOSIS  You might diagnose your own cold based on familiar symptoms, since most people get a cold 2 to 3 times a year. Your caregiver can confirm this based on your exam. Most importantly, your caregiver can check that your symptoms are not due to another disease such as strep throat, sinusitis, pneumonia,  asthma, or epiglottitis. Blood tests, throat tests, and X-rays are not necessary to diagnose a common cold, but they may sometimes be helpful in excluding other more serious diseases. Your caregiver will decide if any further tests are required. RISKS AND COMPLICATIONS  You may be at risk for a more severe case of the common cold if you smoke cigarettes, have chronic heart disease (such as heart failure) or lung disease (such as asthma), or if you have a weakened immune system. The very young and very old are also at risk for more serious infections. Bacterial sinusitis, middle ear infections, and bacterial pneumonia can complicate the common cold. The common cold can worsen asthma and chronic obstructive pulmonary disease (COPD). Sometimes, these complications can require emergency medical care and may be life-threatening. PREVENTION  The best way to protect against getting a cold is to practice good hygiene. Avoid oral or hand contact with people with cold symptoms. Wash your hands often if contact occurs. There is no clear evidence that vitamin C, vitamin E, echinacea, or exercise reduces the chance of developing a cold. However, it is always recommended to get plenty of rest and practice good nutrition. TREATMENT  Treatment is directed at relieving symptoms. There is no cure. Antibiotics are not effective, because the infection is caused by a virus, not by bacteria. Treatment may include:  Increased fluid intake. Sports drinks offer valuable electrolytes, sugars, and fluids.  Breathing heated mist or steam (vaporizer or shower).  Eating chicken soup or other clear broths, and maintaining good  nutrition.  Getting plenty of rest.  Using gargles or lozenges for comfort.  Controlling fevers with ibuprofen or acetaminophen as directed by your caregiver.  Increasing usage of your inhaler if you have asthma. Zinc gel and zinc lozenges, taken in the first 24 hours of the common cold, can shorten the  duration and lessen the severity of symptoms. Pain medicines may help with fever, muscle aches, and throat pain. A variety of non-prescription medicines are available to treat congestion and runny nose. Your caregiver can make recommendations and may suggest nasal or lung inhalers for other symptoms.  HOME CARE INSTRUCTIONS   Only take over-the-counter or prescription medicines for pain, discomfort, or fever as directed by your caregiver.  Use a warm mist humidifier or inhale steam from a shower to increase air moisture. This may keep secretions moist and make it easier to breathe.  Drink enough water and fluids to keep your urine clear or pale yellow.  Rest as needed.  Return to work when your temperature has returned to normal or as your caregiver advises. You may need to stay home longer to avoid infecting others. You can also use a face mask and careful hand washing to prevent spread of the virus. SEEK MEDICAL CARE IF:   After the first few days, you feel you are getting worse rather than better.  You need your caregiver's advice about medicines to control symptoms.  You develop chills, worsening shortness of breath, or brown or red sputum. These may be signs of pneumonia.  You develop yellow or brown nasal discharge or pain in the face, especially when you bend forward. These may be signs of sinusitis.  You develop a fever, swollen neck glands, pain with swallowing, or white areas in the back of your throat. These may be signs of strep throat. SEEK IMMEDIATE MEDICAL CARE IF:   You have a fever.  You develop severe or persistent headache, ear pain, sinus pain, or chest pain.  You develop wheezing, a prolonged cough, cough up blood, or have a change in your usual mucus (if you have chronic lung disease).  You develop sore muscles or a stiff neck. Document Released: 11/11/2000 Document Revised: 08/10/2011 Document Reviewed: 09/19/2010 [facility] Patient Information 2014 [place],  [place].

## 2013-06-28 ENCOUNTER — Other Ambulatory Visit: Payer: Self-pay | Admitting: Internal Medicine

## 2013-07-04 ENCOUNTER — Encounter: Payer: Self-pay | Admitting: Pulmonary Disease

## 2013-07-04 ENCOUNTER — Ambulatory Visit (INDEPENDENT_AMBULATORY_CARE_PROVIDER_SITE_OTHER): Payer: Medicare Other | Admitting: Pulmonary Disease

## 2013-07-04 VITALS — BP 124/76 | HR 65 | Temp 98.0°F | Wt 128.0 lb

## 2013-07-04 DIAGNOSIS — R053 Chronic cough: Secondary | ICD-10-CM

## 2013-07-04 DIAGNOSIS — R059 Cough, unspecified: Secondary | ICD-10-CM

## 2013-07-04 DIAGNOSIS — R05 Cough: Secondary | ICD-10-CM

## 2013-07-04 MED ORDER — LEVOFLOXACIN 500 MG PO TABS: 500.0000 mg | ORAL_TABLET | Freq: Every day | ORAL | Status: DC

## 2013-07-04 NOTE — Patient Instructions (Signed)
Levaquin 500 daily x 7ds Take mucinex twice daily while cough is productive OK to take codeine coiugh syrup twice daily as needed

## 2013-07-04 NOTE — Assessment & Plan Note (Signed)
Treat as acute bronchitis Levaquin 500 daily x 7ds Take mucinex twice daily while cough is productive OK to take codeine coiugh syrup twice daily as needed

## 2013-07-04 NOTE — Progress Notes (Signed)
   Subjective:    Patient ID: Amy Dunn, female    DOB: [birth year], 73 y.o.   MRN: [id number]  HPI  PCP - Norins   72/F, ex smoker for FU of -chronic cough since 3/10 attributed to upper airway cough/ GERD  - LLL nodule , first noted on CT feb 2011 ( not seen on CT abd feb 2006 ) -stable x2 y, then slight growth in 2014  She sees Dr Earlean Shawl for GERD & was put on zegerid. LES was dilated - After CT abdomen 3/13 showed -Air and fluid within dilated distal esophagus.? gastroesophageal reflux or distal esophageal stricture  She smoked for 25 Pyrs before quitting in 1980.  Spirometry showed flattening of expiratory limb but no evidence of airway obstruction.  Cough not much changed with trial of 1st gen anti-H, back on claritin D. Did not respond to trial of reglan.- improved on regimen of nasal steroid, zegerid & claritin daily x 1 month  cough controlled on codeine , comes back if she stops.  3/11 allergy panel nml.   CT chest 08/2012 slight increase in size of sub solid nodule within the left lower lobe compared with 07/28/2011. This has clearly increased in size when compared with exam from 2006. The solid component measures less than 5 mm  Stable from April to oct 2014 PFTs - no airway obstruction -mild restriction, TLC 80%, DLCO 70%  07/04/2013  Chief Complaint  Patient presents with  . Acute Visit    Pt c/o prod cough w/ white phlem,slight chest tx, nasal congestion, pnd, slight low grade temp at night, chills x 3 weeks. She is taking promethazine cough syrup. it is helping but cough not going away.      Review of Systems neg for any significant sore throat, dysphagia, itching, sneezing, nasal congestion or excess/ purulent secretions, fever, chills, sweats, unintended wt loss, pleuritic or exertional cp, hempoptysis, orthopnea pnd or change in chronic leg swelling. Also denies presyncope, palpitations, heartburn, abdominal pain, nausea, vomiting, diarrhea or change in bowel or urinary  habits, dysuria,hematuria, rash, arthralgias, visual complaints, headache, numbness weakness or ataxia.     Objective:   Physical Exam  Gen. Pleasant, well-nourished, in no distress ENT - no lesions, no post nasal drip Neck: No JVD, no thyromegaly, no carotid bruits Lungs: no use of accessory muscles, no dullness to percussion, clear without rales or rhonchi  Cardiovascular: Rhythm regular, heart sounds  normal, no murmurs or gallops, no peripheral edema Musculoskeletal: No deformities, no cyanosis or clubbing        Assessment & Plan:

## 2013-08-31 NOTE — Telephone Encounter (Signed)
This encounter was created in error - please disregard.

## 2013-09-13 LAB — PULMONARY FUNCTION TEST
DL/VA % pred: 79 %
DL/VA: 3.73 ml/min/mmHg/L
DLCO unc % pred: 70 %
DLCO unc: 16.11 ml/min/mmHg
FEF 25-75 Post: 3.12 L/sec
FEF 25-75 Pre: 2.3 L/sec
FEF2575-%Change-Post: 35 %
FEF2575-%Pred-Post: 215 %
FEF2575-%Pred-Pre: 158 %
FEV1-%Change-Post: 6 %
FEV1-%Pred-Post: 139 %
FEV1-%Pred-Pre: 131 %
FEV1-Post: 2.26 L
FEV1-Pre: 2.13 L
FEV1FVC-%Change-Post: 5 %
FEV1FVC-%Pred-Pre: 106 %
FEV6-%Change-Post: 0 %
FEV6-%Pred-Post: 130 %
FEV6-%Pred-Pre: 130 %
FEV6-Post: 2.63 L
FEV6-Pre: 2.63 L
FEV6FVC-%Pred-Post: 105 %
FEV6FVC-%Pred-Pre: 105 %
FVC-%Change-Post: 0 %
FVC-%Pred-Post: 124 %
FVC-%Pred-Pre: 124 %
FVC-Post: 2.63 L
Post FEV1/FVC ratio: 86 %
Post FEV6/FVC ratio: 100 %
Pre FEV1/FVC ratio: 81 %
Pre FEV6/FVC Ratio: 100 %
RV % pred: 52 %
RV: 1.16 L
TLC % pred: 80 %
TLC: 3.97 L

## 2013-09-29 ENCOUNTER — Encounter (HOSPITAL_COMMUNITY): Payer: Medicare Other

## 2013-10-30 ENCOUNTER — Ambulatory Visit (HOSPITAL_COMMUNITY): Payer: Medicare Other

## 2013-11-10 ENCOUNTER — Telehealth: Payer: Self-pay | Admitting: *Deleted

## 2013-11-10 NOTE — Telephone Encounter (Signed)
I called spoke with pt. She reports her spouse had heart surgery the day of PET scan. She needs to r/s this appt. Please advise PCC's thanks

## 2013-11-10 NOTE — Telephone Encounter (Signed)
Amy Dunn was a no-show for her NM Pet scan on 10/30/13. Could you please review and either reschedule this NM Pet scan or authorize to cancel this open EPIC Order. DOB is [birth year]

## 2013-11-14 ENCOUNTER — Other Ambulatory Visit: Payer: Self-pay | Admitting: *Deleted

## 2013-11-14 MED ORDER — TRAMADOL HCL 50 MG PO TABS: 50.0000 mg | ORAL_TABLET | Freq: Four times a day (QID) | ORAL | Status: DC | PRN

## 2013-11-14 NOTE — Telephone Encounter (Signed)
Faxed script back to [facility]...lmb 

## 2013-11-21 ENCOUNTER — Ambulatory Visit (HOSPITAL_COMMUNITY): Payer: Medicare Other

## 2013-11-29 ENCOUNTER — Ambulatory Visit (HOSPITAL_COMMUNITY)
Admission: RE | Admit: 2013-11-29 | Discharge: 2013-11-29 | Disposition: A | Discharge status: Home / Self Care | Payer: Medicare Other | Source: Ambulatory Visit | Attending: Pulmonary Disease | Admitting: Pulmonary Disease

## 2013-11-29 DIAGNOSIS — K838 Other specified diseases of biliary tract: Secondary | ICD-10-CM | POA: Insufficient documentation

## 2013-11-29 DIAGNOSIS — J984 Other disorders of lung: Secondary | ICD-10-CM

## 2013-11-29 DIAGNOSIS — N289 Disorder of kidney and ureter, unspecified: Secondary | ICD-10-CM | POA: Insufficient documentation

## 2013-11-29 DIAGNOSIS — R911 Solitary pulmonary nodule: Secondary | ICD-10-CM | POA: Insufficient documentation

## 2013-11-29 LAB — GLUCOSE, CAPILLARY: GLUCOSE-CAPILLARY: 95 mg/dL (ref 70–99)

## 2013-11-29 MED ORDER — FLUDEOXYGLUCOSE F - 18 (FDG) INJECTION
5.8000 | Freq: Once | INTRAVENOUS | Status: AC | PRN
  Administered 2013-11-29: 5.8 via INTRAVENOUS

## 2013-12-04 ENCOUNTER — Encounter: Payer: Self-pay | Admitting: Pulmonary Disease

## 2013-12-04 ENCOUNTER — Ambulatory Visit (INDEPENDENT_AMBULATORY_CARE_PROVIDER_SITE_OTHER): Payer: Medicare Other | Admitting: Pulmonary Disease

## 2013-12-04 VITALS — BP 122/58 | HR 57 | Temp 98.0°F | Wt 128.0 lb

## 2013-12-04 DIAGNOSIS — J984 Other disorders of lung: Secondary | ICD-10-CM

## 2013-12-04 NOTE — Assessment & Plan Note (Signed)
Slow growth from is 11 and low-grade hypermetabolism on PET is very suggestive of slow-growing adenocarcinoma. I discussed with her the various options including biopsy and resection. Since she has good lung function, I think we can proceed with resection She would like to delay surgery for about 2 months and I think that would be okay Referral will be made to thoracic surgery

## 2013-12-04 NOTE — Patient Instructions (Signed)
We discussed possibility of cancer Referral to thoracic surgery

## 2013-12-04 NOTE — Progress Notes (Signed)
   Subjective:    Patient ID: Amy Dunn, female    DOB: 03-25-1941, 73 y.o.   MRN: [id number]  HPI  PCP - Norins  73/F, ex smoker for FU of -chronic cough since 3/10 attributed to upper airway cough/ GERD  She smoked for 25 Pyrs before quitting in 1980.   - LLL nodule , first noted on CT feb 2011 ( not seen on CT abd feb 2006 ) -stable x2 y, then slight growth in 2014  She sees Dr Earlean Shawl for GERD & was put on zegerid. LES was dilated - After CT abdomen 3/13 showed -Air and fluid within dilated distal esophagus.? gastroesophageal reflux or distal esophageal stricture    Cough not much changed with trial of 1st gen anti-H, back on claritin D. Did not respond to trial of reglan.- improved on regimen of nasal steroid, zegerid & claritin daily x 1 month  cough controlled on codeine , comes back if she stops.  3/11 allergy panel nml.  CT chest 08/2012 slight increase in size of sub solid nodule within the left lower lobe compared with 07/28/2011. This has clearly increased in size when compared with exam from 2006. The solid component measures less than 5 mm  Stable from April to oct 2014  Spirometry showed flattening of expiratory limb but no evidence of airway obstruction.  PFTs - no airway obstruction -mild restriction, TLC 80%, DLCO 70%    PET showed Progressively enlarging 1.6 cm sub solid left lower lobe nodule with low level FDG activity SUV 1.6   Review of Systems neg for any significant sore throat, dysphagia, itching, sneezing, nasal congestion or excess/ purulent secretions, fever, chills, sweats, unintended wt loss, pleuritic or exertional cp, hempoptysis, orthopnea pnd or change in chronic leg swelling. Also denies presyncope, palpitations, heartburn, abdominal pain, nausea, vomiting, diarrhea or change in bowel or urinary habits, dysuria,hematuria, rash, arthralgias, visual complaints, headache, numbness weakness or ataxia.     Objective:   Physical Exam  Gen. Pleasant,  well-nourished, in no distress ENT - no lesions, no post nasal drip Neck: No JVD, no thyromegaly, no carotid bruits Lungs: no use of accessory muscles, no dullness to percussion, clear without rales or rhonchi  Cardiovascular: Rhythm regular, heart sounds  normal, no murmurs or gallops, no peripheral edema Musculoskeletal: No deformities, no cyanosis or clubbing        Assessment & Plan:

## 2013-12-07 ENCOUNTER — Ambulatory Visit (INDEPENDENT_AMBULATORY_CARE_PROVIDER_SITE_OTHER): Payer: Medicare Other | Admitting: Ophthalmology

## 2013-12-12 ENCOUNTER — Encounter: Payer: Medicare Other | Admitting: Thoracic Surgery (Cardiothoracic Vascular Surgery)

## 2013-12-13 ENCOUNTER — Ambulatory Visit (INDEPENDENT_AMBULATORY_CARE_PROVIDER_SITE_OTHER): Payer: Medicare Other | Admitting: Ophthalmology

## 2013-12-13 DIAGNOSIS — I10 Essential (primary) hypertension: Secondary | ICD-10-CM

## 2013-12-13 DIAGNOSIS — H348392 Tributary (branch) retinal vein occlusion, unspecified eye, stable: Secondary | ICD-10-CM

## 2013-12-13 DIAGNOSIS — H43819 Vitreous degeneration, unspecified eye: Secondary | ICD-10-CM

## 2013-12-13 DIAGNOSIS — H35039 Hypertensive retinopathy, unspecified eye: Secondary | ICD-10-CM

## 2013-12-14 ENCOUNTER — Ambulatory Visit (INDEPENDENT_AMBULATORY_CARE_PROVIDER_SITE_OTHER): Payer: Medicare Other | Admitting: Ophthalmology

## 2013-12-14 ENCOUNTER — Institutional Professional Consult (permissible substitution) (INDEPENDENT_AMBULATORY_CARE_PROVIDER_SITE_OTHER): Payer: Medicare Other | Admitting: Thoracic Surgery (Cardiothoracic Vascular Surgery)

## 2013-12-14 ENCOUNTER — Encounter: Payer: Self-pay | Admitting: Thoracic Surgery (Cardiothoracic Vascular Surgery)

## 2013-12-14 ENCOUNTER — Other Ambulatory Visit: Payer: Self-pay | Admitting: *Deleted

## 2013-12-14 VITALS — BP 130/79 | HR 70 | Resp 20 | Ht 63.0 in | Wt 128.0 lb

## 2013-12-14 DIAGNOSIS — R911 Solitary pulmonary nodule: Secondary | ICD-10-CM

## 2013-12-14 NOTE — Progress Notes (Signed)
PCP is Adella Hare, MD Referring Provider is Norins, Heinz Knuckles, MD; Kara Mead MD  Chief Complaint  Patient presents with  . Lung Lesion    Surgical eval, PET Scan 11/29/13     HPI: 73 year old woman sent for consultation regarding an enlarging left lower lobe lung nodule.  [name] is a 73 year old woman with an insignificant smoking history (< a pack a day for < 5 years). She has had a chronic dry cough for several years now. This has been attributed to reflux, which she does have. In 2013 she had a CT of the chest as part of the workup for cough. She was noted to have a groundglass opacity in the left lower lobe. This has been followed at six-month intervals since that time. She recently had a PET/CT, which showed an increase in the size of the nodule. It was mildly hypermetabolic. There were no other suspicious findings.  She states that she has lost 2 pounds over the past 6 months. She continues to have a dry cough. She denies hemoptysis. She is active and does not had any problems with chest pain, tightness, or pressure. She denies shortness of breath at rest or with exertion. She denies wheezing.  ECOG/ZUBROD= 0   Past Medical History  Diagnosis Date  . History of colonic polyps   . Acute peptic ulcer, unspecified site, with hemorrhage and perforation, without mention of obstruction   . Osteoarthritis of left shoulder   . Primary osteoarthritis of left hip   . Pulmonary nodule, left     Left Lower lobe  . GERD (gastroesophageal reflux disease)   . Cough   . Hypertension   . TIA (transient ischemic attack) 2011    transient blindness, speech- resolved    Past Surgical History  Procedure Laterality Date  . Colon surgery    . Surgical repair of peptic ulcer    . Tubal ligation      Family History  Problem Relation Age of Onset  . Hypertension Mother   . Heart disease Mother   . Heart attack Mother   . Cancer Father     Colon cancer    Social History History   Substance Use Topics  . Smoking status: Former Smoker -- 1 years    Types: Cigarettes    Quit date: 05/26/1981  . Smokeless tobacco: Never Used     Comment: 2 ciggs per day  . Alcohol Use: No    Current Outpatient Prescriptions  Medication Sig Dispense Refill  . atenolol (TENORMIN) 50 MG tablet take 1 tablet by mouth once daily  30 tablet  11  . bimatoprost (LUMIGAN) 0.03 % ophthalmic solution Place 1 drop into the left eye at bedtime.      . clopidogrel (PLAVIX) 75 MG tablet take 1 tablet by mouth once daily WITH BREAKFAST  30 tablet  11  . dorzolamide-timolol (COSOPT) 22.3-6.8 MG/ML ophthalmic solution Place 1 drop into the right eye 2 (two) times daily.      . fexofenadine (ALLEGRA) 180 MG tablet Take 180 mg by mouth daily as needed. For allergies      . fluticasone (FLONASE) 50 MCG/ACT nasal spray Place 2 sprays into the nose daily.  16 g  2  . guaiFENesin (MUCINEX) 600 MG 12 hr tablet Take 600 mg by mouth 2 (two) times daily as needed.       Marland Kitchen losartan-hydrochlorothiazide (HYZAAR) 100-25 MG per tablet take 1 tablet by mouth once daily  90 tablet  3  . omeprazole (PRILOSEC) 20 MG capsule Take 1 capsule (20 mg total) by mouth daily.  30 capsule  3  . promethazine-codeine (PHENERGAN WITH CODEINE) 6.25-10 MG/5ML syrup Take 5 mLs by mouth as needed.      . temazepam (RESTORIL) 15 MG capsule Take 1 capsule (15 mg total) by mouth at bedtime as needed for sleep.  30 capsule  1  . traMADol (ULTRAM) 50 MG tablet Take 1 tablet (50 mg total) by mouth every 6 (six) hours as needed for moderate pain.  100 tablet  2   No current facility-administered medications for this visit.    Allergies  Allergen Reactions  . Amlodipine     Trouble swallowing  . Penicillins     REACTION: itching    Review of Systems  Constitutional: Negative for fever, chills, diaphoresis, activity change, fatigue and unexpected weight change.  Respiratory: Positive for cough. Negative for chest tightness, shortness  of breath, wheezing and stridor.   Cardiovascular: Negative for chest pain, palpitations and leg swelling.  Gastrointestinal:       Heartburn, reflux, history of esophageal dilatation  Neurological: Positive for numbness.       TIA in 2011 with transient blindness and slurred speech, completely resolved.  Hematological: Bruises/bleeds easily (No history of excessive bleeding with prior surgeries,is on Plavix).  All other systems reviewed and are negative.   BP 130/79  Pulse 70  Resp 20  Ht 5\' 3"  (1.6 m)  Wt 128 lb (58.06 kg)  BMI 22.68 kg/m2  SpO2 98% Physical Exam  Vitals reviewed. Constitutional: She is oriented to person, place, and time. She appears well-developed and well-nourished. No distress.  HENT:  Head: Normocephalic and atraumatic.  Eyes: EOM are normal. Pupils are equal, round, and reactive to light.  Neck: Neck supple. No thyromegaly present.  Cardiovascular: Normal rate, regular rhythm, normal heart sounds and intact distal pulses.  Exam reveals no gallop and no friction rub.   No murmur heard. Pulmonary/Chest: Effort normal and breath sounds normal. She has no wheezes. She has no rales.  Abdominal: Soft. There is no tenderness.  Musculoskeletal: She exhibits no edema.  Lymphadenopathy:    She has no cervical adenopathy.  Neurological: She is alert and oriented to person, place, and time. No cranial nerve deficit.  No focal deficits  Skin: Skin is warm and dry.     Diagnostic Tests: NUCLEAR MEDICINE PET SKULL BASE TO THIGH  TECHNIQUE:  5.8 mCi F-18 FDG was injected intravenously. Full-ring PET imaging  was performed from the skull base to thigh after the radiotracer. CT  data was obtained and used for attenuation correction and anatomic  localization.  FASTING BLOOD GLUCOSE: Value: 95 mg/dl  COMPARISON: CT chest 03/29/2013, 09/08/2012, 07/28/2011 and  07/24/2010.  FINDINGS:  NECK  No hypermetabolic lymph nodes in the neck. CT images show no acute   findings. There is periventricular low attenuation within the deep  white matter, indicative of chronic microvascular white matter  ischemic changes. Visualized portions of the paranasal sinuses and  mastoid air cells are clear.  CHEST  A spiculated sub solid nodule in the left lower lobe measures 1.2 x  1.6 cm with the internal solid component measuring approximately 4  mm, medially. Lesion has an SUV max of 1.6. No hypermetabolic  mediastinal or hilar lymph nodes.  CT images show no pericardial or pleural effusion.  ABDOMEN/PELVIS  No abnormal hypermetabolism in the liver, adrenal glands, spleen or  pancreas. No hypermetabolic lymph nodes.  CT images show a low-attenuation lesions in the right hepatic lobe  measuring up to 1.7 cm, unchanged and likely cysts. Intrahepatic and  extrahepatic biliary duct dilatation appears unchanged from  08/26/2011. Extrahepatic bile duct measures approximately 12 mm.  Gallbladder and adrenal glands are unremarkable. Low-attenuation  lesions in the kidneys measure up to 1.8 cm on the right, similar to  08/26/2011. A 1.7 cm low-attenuation lesion in the interpolar right  kidney has mild high attenuation medially (series 4, image 104) but  is unchanged in size from 08/26/2011, suggesting a mildly complex  cyst. Spleen, pancreas, stomach and bowel are otherwise grossly  unremarkable. No free fluid.  SKELETON  No abnormal osseous hypermetabolism.  IMPRESSION:  Progressively enlarging sub solid left lower lobe nodule with low  level FDG activity. 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 If this is a non-small cell lung cancer, it is most  consistent with stage IA disease.  Electronically Signed  By: Lorin Picket M.D.  On: 11/29/2013 11:11  Pulmonary function test FVC 2.63 (124%) FEV1 2.13 (131%) FEV1 to FVC ratio 0.8 DLCO 70% of predicted  Impression: 73 year old woman with a groundglass opacity that has slowly increased  in size over time. It is mildly hypermetabolic on PET CT. There also does appear now to be a small solid component. This is highly suspicious for a low-grade adenocarcinoma.  I reviewed the CT and PET images with Mrs. Amy Dunn. We discussed our options. Given the increase in size we do need a definitive diagnosis. We discussed a bronchoscopic or CT-guided biopsy versus a wedge resection. She understands that the possibility of a false negative bronchoscopic or CT-guided biopsy would render a negative result of a little use. I agree with Dr. Elsworth Soho that the best option is to do a thoracoscopic wedge resection for definitive diagnosis and treatment. That was my recommendation to her.  We discussed the proposed procedure which was left video-assisted thoracoscopy, wedge resection, possible segmentectomy or lobectomy, and cryo- analgesia of the intercostal nerves. We discussed the general nature of the procedure, including the incisions to be used, the need for general anesthesia, expected hospital stay, and overall recovery. We discussed the indications, risks, benefits, and alternatives. She understands that the risk include, but not limited to death, MI, stroke, DVT, PE, bleeding, possible need for transfusion, infection, prolonged air leak, cardiac arrhythmias, as well as the possibility of unforeseeable complications. We discussed the technique a parietal analgesia of the intercostal nerves. She understands this is a relatively new procedure to decrease postoperative pain.  Mrs. Matusek wishes to proceed but wishes to wait until the end of August to have the procedure done.  Plan: Left VATS, wedge resection, possible segmentectomy or lobectomy, cryo- analgesia of intercostal nerves on Monday, August 31.  She will need to hold her Plavix for 7 days prior to the procedure

## 2013-12-19 ENCOUNTER — Encounter: Payer: Medicare Other | Admitting: Thoracic Surgery (Cardiothoracic Vascular Surgery)

## 2014-01-16 ENCOUNTER — Encounter (HOSPITAL_COMMUNITY): Payer: Self-pay | Admitting: Pharmacy Technician

## 2014-01-25 ENCOUNTER — Encounter (HOSPITAL_COMMUNITY): Payer: Self-pay

## 2014-01-25 ENCOUNTER — Encounter (HOSPITAL_COMMUNITY)
Admission: RE | Admit: 2014-01-25 | Discharge: 2014-01-25 | Disposition: A | Discharge status: Home / Self Care | Payer: Medicare Other | Source: Ambulatory Visit | Attending: Thoracic Surgery (Cardiothoracic Vascular Surgery) | Admitting: Thoracic Surgery (Cardiothoracic Vascular Surgery)

## 2014-01-25 VITALS — BP 113/63 | HR 65 | Temp 98.4°F | Resp 18 | Ht 65.0 in | Wt 126.6 lb

## 2014-01-25 DIAGNOSIS — Z01818 Encounter for other preprocedural examination: Secondary | ICD-10-CM | POA: Diagnosis present

## 2014-01-25 DIAGNOSIS — R911 Solitary pulmonary nodule: Secondary | ICD-10-CM

## 2014-01-25 HISTORY — DX: Cerebral infarction, unspecified: I63.9

## 2014-01-25 HISTORY — DX: Unspecified glaucoma: H40.9

## 2014-01-25 LAB — BLOOD GAS, ARTERIAL
Acid-Base Excess: 3.1 mmol/L — ABNORMAL HIGH (ref 0.0–2.0)
BICARBONATE: 27.3 meq/L — AB (ref 20.0–24.0)
Drawn by: 206361
FIO2: 0.21 %
O2 Saturation: 94.7 %
PO2 ART: 73.4 mmHg — AB (ref 80.0–100.0)
Patient temperature: 98.6
TCO2: 28.6 mmol/L (ref 0–100)
pCO2 arterial: 43.3 mmHg (ref 35.0–45.0)
pH, Arterial: 7.416 (ref 7.350–7.450)

## 2014-01-25 LAB — URINALYSIS, ROUTINE W REFLEX MICROSCOPIC
Bilirubin Urine: NEGATIVE
Glucose, UA: NEGATIVE mg/dL
Hgb urine dipstick: NEGATIVE
KETONES UR: NEGATIVE mg/dL
NITRITE: NEGATIVE
PH: 6 (ref 5.0–8.0)
Protein, ur: NEGATIVE mg/dL
Specific Gravity, Urine: 1.014 (ref 1.005–1.030)
Urobilinogen, UA: 0.2 mg/dL (ref 0.0–1.0)

## 2014-01-25 LAB — COMPREHENSIVE METABOLIC PANEL
ALK PHOS: 84 U/L (ref 39–117)
ALT: 9 U/L (ref 0–35)
AST: 15 U/L (ref 0–37)
Albumin: 3.6 g/dL (ref 3.5–5.2)
Anion gap: 16 — ABNORMAL HIGH (ref 5–15)
BILIRUBIN TOTAL: 0.3 mg/dL (ref 0.3–1.2)
BUN: 21 mg/dL (ref 6–23)
CHLORIDE: 98 meq/L (ref 96–112)
CO2: 24 mEq/L (ref 19–32)
Calcium: 8.7 mg/dL (ref 8.4–10.5)
Creatinine, Ser: 1.28 mg/dL — ABNORMAL HIGH (ref 0.50–1.10)
GFR calc non Af Amer: 40 mL/min — ABNORMAL LOW (ref >90)
GFR, EST AFRICAN AMERICAN: 46 mL/min — AB (ref >90)
Glucose, Bld: 102 mg/dL — ABNORMAL HIGH (ref 70–99)
POTASSIUM: 3.7 meq/L (ref 3.7–5.3)
SODIUM: 138 meq/L (ref 137–147)
TOTAL PROTEIN: 7.2 g/dL (ref 6.0–8.3)

## 2014-01-25 LAB — CBC
HCT: 30.3 % — ABNORMAL LOW (ref 36.0–46.0)
Hemoglobin: 10.9 g/dL — ABNORMAL LOW (ref 12.0–15.0)
MCH: 28.2 pg (ref 26.0–34.0)
MCHC: 36 g/dL (ref 30.0–36.0)
MCV: 78.3 fL (ref 78.0–100.0)
Platelets: 157 10*3/uL (ref 150–400)
RBC: 3.87 MIL/uL (ref 3.87–5.11)
RDW: 14 % (ref 11.5–15.5)
WBC: 7.7 10*3/uL (ref 4.0–10.5)

## 2014-01-25 LAB — URINE MICROSCOPIC-ADD ON

## 2014-01-25 LAB — PROTIME-INR
INR: 0.97 (ref 0.00–1.49)
Prothrombin Time: 12.9 seconds (ref 11.6–15.2)

## 2014-01-25 LAB — APTT: aPTT: 33 seconds (ref 24–37)

## 2014-01-25 LAB — ABO/RH: ABO/RH(D): O POS

## 2014-01-25 LAB — SURGICAL PCR SCREEN
MRSA, PCR: NEGATIVE
Staphylococcus aureus: NEGATIVE

## 2014-01-25 NOTE — Progress Notes (Signed)
Primary - dr. Harland Dingwall No cardiologist had stress test >5 years ago

## 2014-01-25 NOTE — Pre-Procedure Instructions (Signed)
Amy Dunn  01/25/2014   Your procedure is scheduled on:  Monday, August 31st  Report to [facility] Admitting at 530 AM.  Call this number if you have problems the morning of surgery: [phone]   Remember:   Do not eat food or drink liquids after midnight.   Take these medicines the morning of surgery with A SIP OF WATER: atenolol, zegerid, flonase, ultram if needed, eye drops   Do not wear jewelry, make-up or nail polish.  Do not wear lotions, powders, or perfumes,deodorant.  Do not shave 48 hours prior to surgery. Men may shave face and neck.  Do not bring valuables to the hospital.  [facility] is not responsible  for any belongings or valuables.               Contacts, dentures or bridgework may not be worn into surgery.  Leave suitcase in the car. After surgery it may be brought to your room.  For patients admitted to the hospital, discharge time is determined by your  treatment team.            Please read over the following fact sheets that you were given: Pain Booklet, Coughing and Deep Breathing, Blood Transfusion Information, MRSA Information and Surgical Site Infection Prevention [place] - Preparing for Surgery  Before surgery, you can play an important role.  Because skin is not sterile, your skin needs to be as free of germs as possible.  You can reduce the number of germs on you skin by washing with CHG (chlorahexidine gluconate) soap before surgery.  CHG is an antiseptic cleaner which kills germs and bonds with the skin to continue killing germs even after washing.  Please DO NOT use if you have an allergy to CHG or antibacterial soaps.  If your skin becomes reddened/irritated stop using the CHG and inform your nurse when you arrive at Short Stay.  Do not shave (including legs and underarms) for at least 48 hours prior to the first CHG shower.  You may shave your face.  Please follow these instructions carefully:   1.  Shower with CHG Soap the night  before surgery and the morning of Surgery.  2.  If you choose to wash your hair, wash your hair first as usual with your normal shampoo.  3.  After you shampoo, rinse your hair and body thoroughly to remove the shampoo.  4.  Use CHG as you would any other liquid soap.  You can apply CHG directly to the skin and wash gently with scrungie or a clean washcloth.  5.  Apply the CHG Soap to your body ONLY FROM THE NECK DOWN.  Do not use on open wounds or open sores.  Avoid contact with your eyes, ears, mouth and genitals (private parts).  Wash genitals (private parts) with your normal soap.  6.  Wash thoroughly, paying special attention to the area where your surgery will be performed.  7.  Thoroughly rinse your body with warm water from the neck down.  8.  DO NOT shower/wash with your normal soap after using and rinsing off the CHG Soap.  9.  Pat yourself dry with a clean towel.            10.  Wear clean pajamas.            11.  Place clean sheets on your bed the night of your first shower and do not sleep with pets.  Day of  Surgery  Do not apply any lotions/deoderants the morning of surgery.  Please wear clean clothes to the hospital/surgery center.

## 2014-01-26 NOTE — Progress Notes (Signed)
LVM on doctors staff nurse answering machine to make MD aware of abnormal UA.

## 2014-01-28 MED ORDER — VANCOMYCIN HCL IN DEXTROSE 1-5 GM/200ML-% IV SOLN
1000.0000 mg | INTRAVENOUS | Status: AC
  Administered 2014-01-29: 1000 mg via INTRAVENOUS
  Filled 2014-01-28: qty 200

## 2014-01-29 ENCOUNTER — Encounter (HOSPITAL_COMMUNITY): Payer: Self-pay | Admitting: *Deleted

## 2014-01-29 ENCOUNTER — Encounter (HOSPITAL_COMMUNITY): Payer: Medicare Other | Admitting: Anesthesiology

## 2014-01-29 ENCOUNTER — Encounter (HOSPITAL_COMMUNITY)
Admission: RE | Disposition: A | Discharge status: Home / Self Care | Payer: Self-pay | Source: Ambulatory Visit | Attending: Thoracic Surgery (Cardiothoracic Vascular Surgery)

## 2014-01-29 ENCOUNTER — Inpatient Hospital Stay (HOSPITAL_COMMUNITY): Payer: Medicare Other

## 2014-01-29 ENCOUNTER — Inpatient Hospital Stay (HOSPITAL_COMMUNITY)
Admission: RE | Admit: 2014-01-29 | Discharge: 2014-02-03 | DRG: 164 | Disposition: A | Discharge status: Home / Self Care | Payer: Medicare Other | Source: Ambulatory Visit | Attending: Thoracic Surgery (Cardiothoracic Vascular Surgery) | Admitting: Thoracic Surgery (Cardiothoracic Vascular Surgery)

## 2014-01-29 ENCOUNTER — Inpatient Hospital Stay (HOSPITAL_COMMUNITY): Payer: Medicare Other | Admitting: Anesthesiology

## 2014-01-29 DIAGNOSIS — R339 Retention of urine, unspecified: Secondary | ICD-10-CM | POA: Diagnosis present

## 2014-01-29 DIAGNOSIS — F17200 Nicotine dependence, unspecified, uncomplicated: Secondary | ICD-10-CM | POA: Diagnosis present

## 2014-01-29 DIAGNOSIS — K589 Irritable bowel syndrome without diarrhea: Secondary | ICD-10-CM | POA: Diagnosis present

## 2014-01-29 DIAGNOSIS — C3490 Malignant neoplasm of unspecified part of unspecified bronchus or lung: Secondary | ICD-10-CM | POA: Diagnosis present

## 2014-01-29 DIAGNOSIS — I10 Essential (primary) hypertension: Secondary | ICD-10-CM | POA: Diagnosis present

## 2014-01-29 DIAGNOSIS — Z7901 Long term (current) use of anticoagulants: Secondary | ICD-10-CM | POA: Diagnosis not present

## 2014-01-29 DIAGNOSIS — Z8673 Personal history of transient ischemic attack (TIA), and cerebral infarction without residual deficits: Secondary | ICD-10-CM | POA: Diagnosis not present

## 2014-01-29 DIAGNOSIS — K219 Gastro-esophageal reflux disease without esophagitis: Secondary | ICD-10-CM | POA: Diagnosis present

## 2014-01-29 DIAGNOSIS — R911 Solitary pulmonary nodule: Secondary | ICD-10-CM

## 2014-01-29 DIAGNOSIS — E871 Hypo-osmolality and hyponatremia: Secondary | ICD-10-CM | POA: Diagnosis not present

## 2014-01-29 DIAGNOSIS — E876 Hypokalemia: Secondary | ICD-10-CM | POA: Diagnosis not present

## 2014-01-29 DIAGNOSIS — J9819 Other pulmonary collapse: Secondary | ICD-10-CM | POA: Diagnosis not present

## 2014-01-29 DIAGNOSIS — D62 Acute posthemorrhagic anemia: Secondary | ICD-10-CM | POA: Diagnosis not present

## 2014-01-29 DIAGNOSIS — Z902 Acquired absence of lung [part of]: Secondary | ICD-10-CM

## 2014-01-29 HISTORY — PX: VIDEO ASSISTED THORACOSCOPY (VATS)/WEDGE RESECTION: SHX6174

## 2014-01-29 LAB — POCT I-STAT 7, (LYTES, BLD GAS, ICA,H+H)
Acid-Base Excess: 1 mmol/L (ref 0.0–2.0)
Bicarbonate: 24.6 mEq/L — ABNORMAL HIGH (ref 20.0–24.0)
CALCIUM ION: 1.07 mmol/L — AB (ref 1.13–1.30)
HEMATOCRIT: 25 % — AB (ref 36.0–46.0)
Hemoglobin: 8.5 g/dL — ABNORMAL LOW (ref 12.0–15.0)
O2 SAT: 100 %
Potassium: 2.8 mEq/L — CL (ref 3.7–5.3)
SODIUM: 137 meq/L (ref 137–147)
TCO2: 26 mmol/L (ref 0–100)
pCO2 arterial: 35.3 mmHg (ref 35.0–45.0)
pH, Arterial: 7.451 — ABNORMAL HIGH (ref 7.350–7.450)
pO2, Arterial: 236 mmHg — ABNORMAL HIGH (ref 80.0–100.0)

## 2014-01-29 LAB — PREPARE RBC (CROSSMATCH)

## 2014-01-29 SURGERY — VIDEO ASSISTED THORACOSCOPY (VATS)/WEDGE RESECTION
Anesthesia: General | Site: Chest

## 2014-01-29 MED ORDER — HYDROMORPHONE HCL PF 1 MG/ML IJ SOLN
0.2500 mg | INTRAMUSCULAR | Status: DC | PRN
  Administered 2014-01-29 (×4): 0.5 mg via INTRAVENOUS

## 2014-01-29 MED ORDER — VANCOMYCIN HCL IN DEXTROSE 1-5 GM/200ML-% IV SOLN
1000.0000 mg | Freq: Two times a day (BID) | INTRAVENOUS | Status: AC
  Administered 2014-01-29: 1000 mg via INTRAVENOUS
  Filled 2014-01-29: qty 200

## 2014-01-29 MED ORDER — ONDANSETRON HCL 4 MG/2ML IJ SOLN
INTRAMUSCULAR | Status: AC
  Filled 2014-01-29: qty 2

## 2014-01-29 MED ORDER — HYDROMORPHONE HCL PF 1 MG/ML IJ SOLN
INTRAMUSCULAR | Status: AC
  Administered 2014-01-29: 0.5 mg via INTRAVENOUS
  Filled 2014-01-29: qty 2

## 2014-01-29 MED ORDER — FENTANYL CITRATE 0.05 MG/ML IJ SOLN
INTRAMUSCULAR | Status: AC
  Filled 2014-01-29: qty 5

## 2014-01-29 MED ORDER — PHENYLEPHRINE HCL 10 MG/ML IJ SOLN
10.0000 mg | INTRAVENOUS | Status: DC | PRN
  Administered 2014-01-29: 30 ug/min via INTRAVENOUS

## 2014-01-29 MED ORDER — POTASSIUM CHLORIDE 10 MEQ/100ML IV SOLN
INTRAVENOUS | Status: DC | PRN
  Administered 2014-01-29 (×2): 10 meq via INTRAVENOUS

## 2014-01-29 MED ORDER — GLYCOPYRROLATE 0.2 MG/ML IJ SOLN
INTRAMUSCULAR | Status: DC | PRN
  Administered 2014-01-29: .4 mg via INTRAVENOUS

## 2014-01-29 MED ORDER — NEOSTIGMINE METHYLSULFATE 10 MG/10ML IV SOLN
INTRAVENOUS | Status: DC | PRN
  Administered 2014-01-29: 3 mg via INTRAVENOUS

## 2014-01-29 MED ORDER — DEXAMETHASONE SODIUM PHOSPHATE 4 MG/ML IJ SOLN
INTRAMUSCULAR | Status: DC | PRN
  Administered 2014-01-29: 4 mg via INTRAVENOUS

## 2014-01-29 MED ORDER — HEMOSTATIC AGENTS (NO CHARGE) OPTIME
TOPICAL | Status: DC | PRN
  Administered 2014-01-29: 1 via TOPICAL

## 2014-01-29 MED ORDER — ONDANSETRON HCL 4 MG/2ML IJ SOLN: 4.0000 mg | Freq: Four times a day (QID) | INTRAMUSCULAR | Status: DC | PRN

## 2014-01-29 MED ORDER — ARTIFICIAL TEARS OP OINT
TOPICAL_OINTMENT | OPHTHALMIC | Status: AC
  Filled 2014-01-29: qty 3.5

## 2014-01-29 MED ORDER — FENTANYL 10 MCG/ML IV SOLN
INTRAVENOUS | Status: DC
  Administered 2014-01-29: 15:00:00 via INTRAVENOUS
  Administered 2014-01-29: 30 ug via INTRAVENOUS
  Administered 2014-01-29: 80 ug via INTRAVENOUS
  Administered 2014-01-30: 70 ug via INTRAVENOUS
  Administered 2014-01-30: 40 ug via INTRAVENOUS
  Administered 2014-01-30: 160 ug via INTRAVENOUS
  Administered 2014-01-30: 22:00:00 via INTRAVENOUS
  Administered 2014-01-30: 20 ug via INTRAVENOUS
  Administered 2014-01-30: 40 ug via INTRAVENOUS
  Administered 2014-01-30: 20 ug via INTRAVENOUS
  Administered 2014-01-30: 40 ug via INTRAVENOUS
  Administered 2014-01-31: 120 ug via INTRAVENOUS
  Administered 2014-01-31: 70 ug via INTRAVENOUS
  Administered 2014-01-31: 80 ug via INTRAVENOUS
  Administered 2014-01-31: 20 ug via INTRAVENOUS
  Administered 2014-01-31: 100 ug via INTRAVENOUS
  Administered 2014-02-01: 20 ug via INTRAVENOUS
  Administered 2014-02-01: 120 ug via INTRAVENOUS
  Filled 2014-01-29 (×3): qty 50

## 2014-01-29 MED ORDER — MIDAZOLAM HCL 5 MG/5ML IJ SOLN
INTRAMUSCULAR | Status: DC | PRN
  Administered 2014-01-29: 1 mg via INTRAVENOUS

## 2014-01-29 MED ORDER — ATENOLOL 50 MG PO TABS
50.0000 mg | ORAL_TABLET | Freq: Every day | ORAL | Status: DC
  Administered 2014-01-31 – 2014-02-03 (×4): 50 mg via ORAL
  Filled 2014-01-29 (×5): qty 1

## 2014-01-29 MED ORDER — BISACODYL 5 MG PO TBEC
10.0000 mg | DELAYED_RELEASE_TABLET | Freq: Every day | ORAL | Status: DC
  Administered 2014-01-30 – 2014-02-02 (×4): 10 mg via ORAL
  Filled 2014-01-29 (×5): qty 2

## 2014-01-29 MED ORDER — STERILE WATER FOR INJECTION IJ SOLN
INTRAMUSCULAR | Status: AC
Start: 2014-01-29 — End: 2014-01-29
  Filled 2014-01-29: qty 10

## 2014-01-29 MED ORDER — LOSARTAN POTASSIUM-HCTZ 100-25 MG PO TABS: 1.0000 | ORAL_TABLET | Freq: Every day | ORAL | Status: DC

## 2014-01-29 MED ORDER — SENNOSIDES-DOCUSATE SODIUM 8.6-50 MG PO TABS
1.0000 | ORAL_TABLET | Freq: Every day | ORAL | Status: DC
  Administered 2014-01-29 – 2014-02-01 (×4): 1 via ORAL
  Filled 2014-01-29 (×6): qty 1

## 2014-01-29 MED ORDER — DORZOLAMIDE HCL-TIMOLOL MAL 2-0.5 % OP SOLN
1.0000 [drp] | Freq: Every day | OPHTHALMIC | Status: DC
  Administered 2014-01-29 – 2014-02-03 (×6): 1 [drp] via OPHTHALMIC
  Filled 2014-01-29: qty 10

## 2014-01-29 MED ORDER — ACETAMINOPHEN 160 MG/5ML PO SOLN
325.0000 mg | ORAL | Status: DC | PRN
  Filled 2014-01-29: qty 20.3

## 2014-01-29 MED ORDER — ROCURONIUM BROMIDE 100 MG/10ML IV SOLN
INTRAVENOUS | Status: DC | PRN
  Administered 2014-01-29: 50 mg via INTRAVENOUS

## 2014-01-29 MED ORDER — FLUTICASONE PROPIONATE 50 MCG/ACT NA SUSP: 2.0000 | Freq: Every day | NASAL | Status: DC | PRN

## 2014-01-29 MED ORDER — SUCCINYLCHOLINE CHLORIDE 20 MG/ML IJ SOLN
INTRAMUSCULAR | Status: AC
  Filled 2014-01-29: qty 1

## 2014-01-29 MED ORDER — 0.9 % SODIUM CHLORIDE (POUR BTL) OPTIME
TOPICAL | Status: DC | PRN
  Administered 2014-01-29: 2000 mL

## 2014-01-29 MED ORDER — MIDAZOLAM HCL 2 MG/2ML IJ SOLN
INTRAMUSCULAR | Status: AC
  Filled 2014-01-29: qty 2

## 2014-01-29 MED ORDER — PHENYLEPHRINE HCL 10 MG/ML IJ SOLN
INTRAMUSCULAR | Status: DC | PRN
  Administered 2014-01-29: 40 ug via INTRAVENOUS

## 2014-01-29 MED ORDER — ACETAMINOPHEN 160 MG/5ML PO SOLN
1000.0000 mg | Freq: Four times a day (QID) | ORAL | Status: DC
Start: 2014-01-29 — End: 2014-01-30
  Filled 2014-01-29: qty 40

## 2014-01-29 MED ORDER — OXYCODONE HCL 5 MG PO TABS: 5.0000 mg | ORAL_TABLET | Freq: Once | ORAL | Status: DC | PRN

## 2014-01-29 MED ORDER — OXYCODONE HCL 5 MG PO TABS
10.0000 mg | ORAL_TABLET | ORAL | Status: DC | PRN
  Administered 2014-01-29 – 2014-02-01 (×7): 10 mg via ORAL
  Filled 2014-01-29 (×7): qty 2

## 2014-01-29 MED ORDER — LORATADINE 10 MG PO TABS
10.0000 mg | ORAL_TABLET | Freq: Every day | ORAL | Status: DC
  Administered 2014-01-29 – 2014-02-03 (×6): 10 mg via ORAL
  Filled 2014-01-29 (×6): qty 1

## 2014-01-29 MED ORDER — TRAMADOL HCL 50 MG PO TABS
50.0000 mg | ORAL_TABLET | Freq: Four times a day (QID) | ORAL | Status: DC | PRN
  Administered 2014-02-01 – 2014-02-03 (×4): 100 mg via ORAL
  Filled 2014-01-29 (×4): qty 2

## 2014-01-29 MED ORDER — STERILE WATER FOR INJECTION IJ SOLN
INTRAMUSCULAR | Status: AC
  Filled 2014-01-29: qty 10

## 2014-01-29 MED ORDER — SODIUM CHLORIDE 0.9 % IV SOLN
Freq: Once | INTRAVENOUS | Status: DC
Start: 2014-01-29 — End: 2014-01-29

## 2014-01-29 MED ORDER — LACTATED RINGERS IV SOLN
INTRAVENOUS | Status: DC | PRN
  Administered 2014-01-29: 07:00:00 via INTRAVENOUS

## 2014-01-29 MED ORDER — METOCLOPRAMIDE HCL 5 MG/ML IJ SOLN
10.0000 mg | Freq: Four times a day (QID) | INTRAMUSCULAR | Status: AC
  Administered 2014-01-29 – 2014-01-30 (×4): 10 mg via INTRAVENOUS
  Filled 2014-01-29 (×4): qty 2

## 2014-01-29 MED ORDER — VECURONIUM BROMIDE 10 MG IV SOLR
INTRAVENOUS | Status: AC
  Filled 2014-01-29: qty 10

## 2014-01-29 MED ORDER — HYDROCHLOROTHIAZIDE 25 MG PO TABS
25.0000 mg | ORAL_TABLET | Freq: Every day | ORAL | Status: DC
  Administered 2014-02-01 – 2014-02-02 (×2): 25 mg via ORAL
  Filled 2014-01-29 (×5): qty 1

## 2014-01-29 MED ORDER — POTASSIUM CHLORIDE 10 MEQ/50ML IV SOLN
10.0000 meq | INTRAVENOUS | Status: DC
  Filled 2014-01-29 (×2): qty 50

## 2014-01-29 MED ORDER — FENTANYL CITRATE 0.05 MG/ML IJ SOLN
INTRAMUSCULAR | Status: DC | PRN
  Administered 2014-01-29 (×2): 50 ug via INTRAVENOUS
  Administered 2014-01-29: 25 ug via INTRAVENOUS
  Administered 2014-01-29 (×2): 50 ug via INTRAVENOUS
  Administered 2014-01-29: 100 ug via INTRAVENOUS
  Administered 2014-01-29: 50 ug via INTRAVENOUS

## 2014-01-29 MED ORDER — EPHEDRINE SULFATE 50 MG/ML IJ SOLN
INTRAMUSCULAR | Status: DC | PRN
Start: 2014-01-29 — End: 2014-01-29
  Administered 2014-01-29: 5 mg via INTRAVENOUS

## 2014-01-29 MED ORDER — POTASSIUM CHLORIDE 10 MEQ/50ML IV SOLN
10.0000 meq | Freq: Every day | INTRAVENOUS | Status: DC | PRN
  Filled 2014-01-29 (×2): qty 50

## 2014-01-29 MED ORDER — LOSARTAN POTASSIUM 50 MG PO TABS
100.0000 mg | ORAL_TABLET | Freq: Every day | ORAL | Status: DC
  Administered 2014-02-01: 100 mg via ORAL
  Administered 2014-02-02: 50 mg via ORAL
  Filled 2014-01-29 (×5): qty 2

## 2014-01-29 MED ORDER — ROCURONIUM BROMIDE 50 MG/5ML IV SOLN
INTRAVENOUS | Status: AC
  Filled 2014-01-29: qty 1

## 2014-01-29 MED ORDER — NALOXONE HCL 0.4 MG/ML IJ SOLN: 0.4000 mg | INTRAMUSCULAR | Status: DC | PRN

## 2014-01-29 MED ORDER — LIDOCAINE HCL (CARDIAC) 20 MG/ML IV SOLN
INTRAVENOUS | Status: DC | PRN
  Administered 2014-01-29: 80 mg via INTRAVENOUS

## 2014-01-29 MED ORDER — DIPHENHYDRAMINE HCL 50 MG/ML IJ SOLN: 12.5000 mg | Freq: Four times a day (QID) | INTRAMUSCULAR | Status: DC | PRN

## 2014-01-29 MED ORDER — LACTATED RINGERS IV SOLN
INTRAVENOUS | Status: DC | PRN
  Administered 2014-01-29: 08:00:00 via INTRAVENOUS

## 2014-01-29 MED ORDER — DIPHENHYDRAMINE HCL 12.5 MG/5ML PO ELIX
12.5000 mg | ORAL_SOLUTION | Freq: Four times a day (QID) | ORAL | Status: DC | PRN
Start: 2014-01-29 — End: 2014-02-02
  Filled 2014-01-29: qty 5

## 2014-01-29 MED ORDER — ACETAMINOPHEN 325 MG PO TABS: 325.0000 mg | ORAL_TABLET | ORAL | Status: DC | PRN

## 2014-01-29 MED ORDER — OXYCODONE HCL 5 MG/5ML PO SOLN: 5.0000 mg | Freq: Once | ORAL | Status: DC | PRN

## 2014-01-29 MED ORDER — VECURONIUM BROMIDE 10 MG IV SOLR
INTRAVENOUS | Status: DC | PRN
  Administered 2014-01-29: 1 mg via INTRAVENOUS
  Administered 2014-01-29: 2 mg via INTRAVENOUS
  Administered 2014-01-29: 1 mg via INTRAVENOUS
  Administered 2014-01-29: 2 mg via INTRAVENOUS

## 2014-01-29 MED ORDER — PROPOFOL 10 MG/ML IV BOLUS
INTRAVENOUS | Status: DC | PRN
  Administered 2014-01-29: 100 mg via INTRAVENOUS

## 2014-01-29 MED ORDER — GLYCOPYRROLATE 0.2 MG/ML IJ SOLN
INTRAMUSCULAR | Status: AC
  Filled 2014-01-29: qty 2

## 2014-01-29 MED ORDER — LIDOCAINE HCL (CARDIAC) 20 MG/ML IV SOLN
INTRAVENOUS | Status: AC
  Filled 2014-01-29: qty 5

## 2014-01-29 MED ORDER — EPHEDRINE SULFATE 50 MG/ML IJ SOLN
INTRAMUSCULAR | Status: AC
  Filled 2014-01-29: qty 1

## 2014-01-29 MED ORDER — PANTOPRAZOLE SODIUM 40 MG PO TBEC
40.0000 mg | DELAYED_RELEASE_TABLET | Freq: Every day | ORAL | Status: DC
  Administered 2014-01-30 – 2014-02-03 (×5): 40 mg via ORAL
  Filled 2014-01-29 (×5): qty 1

## 2014-01-29 MED ORDER — PROPOFOL 10 MG/ML IV BOLUS
INTRAVENOUS | Status: AC
  Filled 2014-01-29: qty 20

## 2014-01-29 MED ORDER — DEXTROSE-NACL 5-0.9 % IV SOLN
INTRAVENOUS | Status: DC
  Administered 2014-01-29 – 2014-01-31 (×3): via INTRAVENOUS

## 2014-01-29 MED ORDER — LEVALBUTEROL HCL 0.63 MG/3ML IN NEBU
0.6300 mg | INHALATION_SOLUTION | Freq: Four times a day (QID) | RESPIRATORY_TRACT | Status: DC
  Administered 2014-01-29 – 2014-01-30 (×2): 0.63 mg via RESPIRATORY_TRACT
  Filled 2014-01-29 (×6): qty 3

## 2014-01-29 MED ORDER — ARTIFICIAL TEARS OP OINT
TOPICAL_OINTMENT | OPHTHALMIC | Status: DC | PRN
  Administered 2014-01-29: 1 via OPHTHALMIC

## 2014-01-29 MED ORDER — SODIUM CHLORIDE 0.9 % IJ SOLN: 9.0000 mL | INTRAMUSCULAR | Status: DC | PRN

## 2014-01-29 MED ORDER — ACETAMINOPHEN 500 MG PO TABS
1000.0000 mg | ORAL_TABLET | Freq: Four times a day (QID) | ORAL | Status: DC
  Administered 2014-01-29 – 2014-02-03 (×15): 1000 mg via ORAL
  Filled 2014-01-29 (×23): qty 2

## 2014-01-29 SURGICAL SUPPLY — 95 items
3-0 VICRYL ×4 IMPLANT
APPLIER CLIP 5 13 M/L LIGAMAX5 (MISCELLANEOUS) ×4
APPLIER CLIP ROT 10 11.4 M/L (STAPLE) ×4
BENZOIN TINCTURE PRP APPL 2/3 (GAUZE/BANDAGES/DRESSINGS) ×4 IMPLANT
BIOPATCH RED 1 DISK 7.0 (GAUZE/BANDAGES/DRESSINGS) ×4 IMPLANT
CANISTER SUCTION 2500CC (MISCELLANEOUS) ×8 IMPLANT
CATH KIT ON Q 5IN SLV (PAIN MANAGEMENT) IMPLANT
CATH THORACIC 28FR (CATHETERS) IMPLANT
CATH THORACIC 36FR (CATHETERS) IMPLANT
CATH THORACIC 36FR RT ANG (CATHETERS) IMPLANT
CLIP APPLIE 5 13 M/L LIGAMAX5 (MISCELLANEOUS) ×3 IMPLANT
CLIP APPLIE ROT 10 11.4 M/L (STAPLE) ×3 IMPLANT
CLIP TI MEDIUM 6 (CLIP) ×4 IMPLANT
CONN ST 1/4X3/8  BEN (MISCELLANEOUS)
CONN ST 1/4X3/8 BEN (MISCELLANEOUS) IMPLANT
CONN Y 3/8X3/8X3/8  BEN (MISCELLANEOUS)
CONN Y 3/8X3/8X3/8 BEN (MISCELLANEOUS) IMPLANT
CONT SPEC 4OZ CLIKSEAL STRL BL (MISCELLANEOUS) ×16 IMPLANT
COVER SURGICAL LIGHT HANDLE (MISCELLANEOUS) ×4 IMPLANT
DERMABOND ADHESIVE PROPEN (GAUZE/BANDAGES/DRESSINGS) ×1
DERMABOND ADVANCED (GAUZE/BANDAGES/DRESSINGS)
DERMABOND ADVANCED .7 DNX12 (GAUZE/BANDAGES/DRESSINGS) IMPLANT
DERMABOND ADVANCED .7 DNX6 (GAUZE/BANDAGES/DRESSINGS) ×3 IMPLANT
DRAIN CHANNEL 28F RND 3/8 FF (WOUND CARE) IMPLANT
DRAIN CHANNEL 32F RND 10.7 FF (WOUND CARE) IMPLANT
DRAPE LAPAROSCOPIC ABDOMINAL (DRAPES) ×4 IMPLANT
DRAPE WARM FLUID 44X44 (DRAPE) ×4 IMPLANT
ELECT REM PT RETURN 9FT ADLT (ELECTROSURGICAL) ×4
ELECTRODE REM PT RTRN 9FT ADLT (ELECTROSURGICAL) ×3 IMPLANT
GAUZE SPONGE 4X4 12PLY STRL (GAUZE/BANDAGES/DRESSINGS) ×4 IMPLANT
GLOVE BIO SURGEON STRL SZ 6.5 (GLOVE) ×20 IMPLANT
GLOVE SURG SIGNA 7.5 PF LTX (GLOVE) ×8 IMPLANT
GOWN STRL REUS W/ TWL LRG LVL3 (GOWN DISPOSABLE) ×9 IMPLANT
GOWN STRL REUS W/ TWL XL LVL3 (GOWN DISPOSABLE) ×6 IMPLANT
GOWN STRL REUS W/TWL LRG LVL3 (GOWN DISPOSABLE) ×3
GOWN STRL REUS W/TWL XL LVL3 (GOWN DISPOSABLE) ×2
HANDLE STAPLE ENDO GIA SHORT (STAPLE) ×1
HEMOSTAT SURGICEL 2X14 (HEMOSTASIS) ×4 IMPLANT
KIT BASIN OR (CUSTOM PROCEDURE TRAY) ×4 IMPLANT
KIT ROOM TURNOVER OR (KITS) ×4 IMPLANT
KIT SUCTION CATH 14FR (SUCTIONS) ×4 IMPLANT
NS IRRIG 1000ML POUR BTL (IV SOLUTION) ×16 IMPLANT
PACK CHEST (CUSTOM PROCEDURE TRAY) ×4 IMPLANT
PAD ARMBOARD 7.5X6 YLW CONV (MISCELLANEOUS) ×8 IMPLANT
POUCH ENDO CATCH II 15MM (MISCELLANEOUS) ×4 IMPLANT
POUCH SPECIMEN RETRIEVAL 10MM (ENDOMECHANICALS) ×4 IMPLANT
PROBE CRYO2-ABLATION MALLABLE (MISCELLANEOUS) ×4 IMPLANT
RELOAD EGIA 45 MED/THCK PURPLE (STAPLE) ×16 IMPLANT
RELOAD EGIA 45 TAN VASC (STAPLE) ×16 IMPLANT
RELOAD EGIA 60 MED/THCK PURPLE (STAPLE) ×24 IMPLANT
RELOAD EGIA TRIS TAN 45 CVD (STAPLE) ×8 IMPLANT
RELOAD TRI 2.0 60 XTHK VAS SUL (STAPLE) ×4 IMPLANT
SEALANT PROGEL (MISCELLANEOUS) ×4 IMPLANT
SEALANT SURG COSEAL 4ML (VASCULAR PRODUCTS) IMPLANT
SEALANT SURG COSEAL 8ML (VASCULAR PRODUCTS) IMPLANT
SOLUTION ANTI FOG 6CC (MISCELLANEOUS) ×4 IMPLANT
SPECIMEN JAR MEDIUM (MISCELLANEOUS) ×4 IMPLANT
SPONGE GAUZE 4X4 12PLY STER LF (GAUZE/BANDAGES/DRESSINGS) ×4 IMPLANT
SPONGE INTESTINAL PEANUT (DISPOSABLE) ×20 IMPLANT
STAPLER ENDO GIA 12MM SHORT (STAPLE) ×3 IMPLANT
SUT PDS AB 3-0 SH 27 (SUTURE) ×20 IMPLANT
SUT PROLENE 4 0 RB 1 (SUTURE)
SUT PROLENE 4-0 RB1 .5 CRCL 36 (SUTURE) IMPLANT
SUT SILK  1 MH (SUTURE) ×4
SUT SILK 1 MH (SUTURE) ×12 IMPLANT
SUT SILK 2 0 SH (SUTURE) IMPLANT
SUT SILK 2 0SH CR/8 30 (SUTURE) ×4 IMPLANT
SUT SILK 3 0 SH 30 (SUTURE) IMPLANT
SUT SILK 3 0SH CR/8 30 (SUTURE) IMPLANT
SUT VIC AB 0 CTX 27 (SUTURE) IMPLANT
SUT VIC AB 1 CTX 27 (SUTURE) ×4 IMPLANT
SUT VIC AB 1 CTX 36 (SUTURE) ×2
SUT VIC AB 1 CTX36XBRD ANBCTR (SUTURE) ×6 IMPLANT
SUT VIC AB 2-0 CT1 27 (SUTURE) ×2
SUT VIC AB 2-0 CT1 TAPERPNT 27 (SUTURE) ×6 IMPLANT
SUT VIC AB 2-0 CTX 36 (SUTURE) IMPLANT
SUT VIC AB 3-0 MH 27 (SUTURE) IMPLANT
SUT VIC AB 3-0 SH 27 (SUTURE) ×2
SUT VIC AB 3-0 SH 27X BRD (SUTURE) ×6 IMPLANT
SUT VIC AB 3-0 X1 27 (SUTURE) ×8 IMPLANT
SUT VICRYL 0 UR6 27IN ABS (SUTURE) IMPLANT
SUT VICRYL 2 TP 1 (SUTURE) ×4 IMPLANT
SWAB COLLECTION DEVICE MRSA (MISCELLANEOUS) IMPLANT
SYSTEM SAHARA CHEST DRAIN ATS (WOUND CARE) ×4 IMPLANT
TAPE CLOTH SURG 4X10 WHT LF (GAUZE/BANDAGES/DRESSINGS) ×4 IMPLANT
TIP APPLICATOR SPRAY EXTEND 16 (VASCULAR PRODUCTS) IMPLANT
TOWEL OR 17X24 6PK STRL BLUE (TOWEL DISPOSABLE) ×4 IMPLANT
TOWEL OR 17X26 10 PK STRL BLUE (TOWEL DISPOSABLE) ×8 IMPLANT
TRAP SPECIMEN MUCOUS 40CC (MISCELLANEOUS) IMPLANT
TRAY FOLEY CATH 16FRSI W/METER (SET/KITS/TRAYS/PACK) ×4 IMPLANT
TROCAR XCEL BLADELESS 5X75MML (TROCAR) ×4 IMPLANT
TROCAR XCEL NON-BLD 5MMX100MML (ENDOMECHANICALS) IMPLANT
TUBE ANAEROBIC SPECIMEN COL (MISCELLANEOUS) IMPLANT
TUNNELER SHEATH ON-Q 11GX8 DSP (PAIN MANAGEMENT) IMPLANT
WATER STERILE IRR 1000ML POUR (IV SOLUTION) ×8 IMPLANT

## 2014-01-29 NOTE — Anesthesia Postprocedure Evaluation (Signed)
  Anesthesia Post-op Note  Patient: Amy Dunn  Procedure(s) Performed: Procedure(s): VIDEO ASSISTED THORACOSCOPY (VATS)/BASILAR SEGMENTECTOMY  LEFT LUNG LOWER LOBE WITH FROZEN SECTION, LEFT LOWER LOBECTOMY (Left) CRYO INTERCOSTAL NERVE BLOCK (N/A)  Patient Location: PACU  Anesthesia Type:General  Level of Consciousness: awake and alert   Airway and Oxygen Therapy: Patient Spontanous Breathing and Patient connected to nasal cannula oxygen  Post-op Pain: moderate  Post-op Assessment: Post-op Vital signs reviewed, Patient's Cardiovascular Status Stable, Respiratory Function Stable, Patent Airway, No signs of Nausea or vomiting and Pain level controlled  Post-op Vital Signs: Reviewed and stable  Last Vitals:  Filed Vitals:   01/29/14 1502  BP: 103/64  Pulse: 58  Temp:   Resp: 13    Complications: No apparent anesthesia complications

## 2014-01-29 NOTE — Progress Notes (Signed)
Patient ID: Amy Dunn, female   DOB: 1940-08-24, 73 y.o.   MRN: [id number]  SICU Evening Rounds:  Hemodynamically stable  Sats 100 %  Urine output good  CT output low.  Postop labs ok.

## 2014-01-29 NOTE — Anesthesia Preprocedure Evaluation (Addendum)
Anesthesia Evaluation  Patient identified by MRN, date of birth, ID band Patient awake    Reviewed: Allergy & Precautions, H&P , NPO status , Patient's Chart, lab work & pertinent test results  History of Anesthesia Complications Negative for: history of anesthetic complications  Airway Mallampati: II TM Distance: >3 FB Neck ROM: Full    Dental  (+) Teeth Intact, Dental Advisory Given,    Pulmonary neg shortness of breath, neg COPDneg recent URI, former smoker,  Left lung lesion         Cardiovascular hypertension, Pt. on medications - angina- CAD, - Past MI and - CHF - dysrhythmias - Valvular Problems/MurmursRhythm:Regular     Neuro/Psych TIAnegative psych ROS   GI/Hepatic Neg liver ROS, PUD, GERD-  Medicated and Poorly Controlled,  Endo/Other  negative endocrine ROS  Renal/GU Renal InsufficiencyRenal disease     Musculoskeletal   Abdominal   Peds  Hematology  (+) anemia ,   Anesthesia Other Findings   Reproductive/Obstetrics                         Anesthesia Physical Anesthesia Plan  ASA: III  Anesthesia Plan: General   Post-op Pain Management:    Induction: Intravenous  Airway Management Planned: Double Lumen EBT  Additional Equipment: Arterial line, CVP and Ultrasound Guidance Line Placement  Intra-op Plan:   Post-operative Plan: Extubation in OR  Informed Consent: I have reviewed the patients History and Physical, chart, labs and discussed the procedure including the risks, benefits and alternatives for the proposed anesthesia with the patient or authorized representative who has indicated his/her understanding and acceptance.   Dental advisory given  Plan Discussed with: CRNA and Surgeon  Anesthesia Plan Comments:         Anesthesia Quick Evaluation

## 2014-01-29 NOTE — Brief Op Note (Addendum)
01/29/2014  1:03 PM  PATIENT:  Amy Dunn  73 y.o. female  PRE-OPERATIVE DIAGNOSIS:   Left lower lobe  LUNG NODULE  POST-OPERATIVE DIAGNOSIS:  Left lower lobe  LUNG NODULE  PROCEDURE:  Procedure(s):  VIDEO ASSISTED THORACOSCOPY (VATS)/ -Left Lower Lobe Wedge Resection -Left Basilar Segmentectomy -Lymph Node Dissection  -CRYO INTERCOSTAL NERVE BLOCK (N/A)  SURGEON:  Surgeon(s) and Role:    * Melrose Nakayama, MD - Primary  PHYSICIAN ASSISTANT: Erin Barrett PA-C  ANESTHESIA:   general  EBL:  Total I/O In: 700 [I.V.:700] Out: 725 [Urine:525; Blood:200]  BLOOD ADMINISTERED:none  DRAINS: 28 Straight, 32 Blake Left Chest   LOCAL MEDICATIONS USED:  NONE  SPECIMEN:  Source of Specimen:  Left Lower Lobe Wedge, Basilar Segment Left Lower Lobe  DISPOSITION OF SPECIMEN:  PATHOLOGY  COUNTS:  YES  PLAN OF CARE: Admit to inpatient   PATIENT DISPOSITION:  PACU - hemodynamically stable.   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

## 2014-01-29 NOTE — Anesthesia Procedure Notes (Addendum)
Procedure Name: Intubation Date/Time: 01/29/2014 7:54 AM Performed by: Storm Frisk E Pre-anesthesia Checklist: Patient identified, Timeout performed, Emergency Drugs available, Suction available and Patient being monitored Patient Re-evaluated:Patient Re-evaluated prior to inductionOxygen Delivery Method: Circle system utilized Preoxygenation: Pre-oxygenation with 100% oxygen Intubation Type: IV induction and Cricoid Pressure applied Ventilation: Oral airway inserted - appropriate to patient size Laryngoscope Size: Sabra Heck and 2 Grade View: Grade II Endobronchial tube: Double lumen EBT, Left, EBT position confirmed by fiberoptic bronchoscope and EBT position confirmed by auscultation and 37 Fr Number of attempts: 3 Airway Equipment and Method: Stylet,  Oral airway and Fiberoptic brochoscope Placement Confirmation: ETT inserted through vocal cords under direct vision,  positive ETCO2 and breath sounds checked- equal and bilateral Secured at: 29 cm Tube secured with: Tape Dental Injury: Teeth and Oropharynx as per pre-operative assessment  Difficulty Due To: Difficulty was unanticipated and Difficult Airway- due to anterior larynx Future Recommendations: Recommend- induction with short-acting agent, and alternative techniques readily available Comments: DL x 1 by Storm Frisk, CRNA unsuccessful with MAC 3 and cricoid pressure yielded Grade III view; DL x 1 with MAC 4 and cricoid pressure by Storm Frisk, CRNA unsuccessful yielded Grade III view.  DL x 1 by Ivar Drape, CRNA with Sabra Heck 2 and hard cricoid pressure yielded Grade II view; successful intubation- +EtCO2, B/L breath sounds, and chest rise.  DLT placement confirmed by FOB by Dr. Ermalene Postin.  Dentition as per pre-op condition.

## 2014-01-29 NOTE — Transfer of Care (Signed)
Immediate Anesthesia Transfer of Care Note  Patient: Amy Dunn  Procedure(s) Performed: Procedure(s): VIDEO ASSISTED THORACOSCOPY (VATS)/BASILAR SEGMENTECTOMY  LEFT LUNG LOWER LOBE WITH FROZEN SECTION, LEFT LOWER LOBECTOMY (Left) CRYO INTERCOSTAL NERVE BLOCK (N/A)  Patient Location: PACU  Anesthesia Type:General  Level of Consciousness: awake, alert  and oriented  Airway & Oxygen Therapy: Patient Spontanous Breathing and Patient connected to nasal cannula oxygen  Post-op Assessment: Report given to PACU RN and Post -op Vital signs reviewed and stable  Post vital signs: Reviewed and stable  Complications: No apparent anesthesia complications

## 2014-01-29 NOTE — Interval H&P Note (Signed)
History and Physical Interval Note:  01/29/2014 7:24 AM  Amy Dunn  has presented today for surgery, with the diagnosis of LLL LUNG NODULE  The various methods of treatment have been discussed with the patient and family. After consideration of risks, benefits and other options for treatment, the patient has consented to  Procedure(s): VIDEO ASSISTED THORACOSCOPY (VATS)/WEDGE RESECTION (Left) POSSIBLE SEGMENTECTOMY OR LOBECTOMY (Left) CRYO INTERCOSTAL NERVE BLOCK (N/A) as a surgical intervention .  The patient's history has been reviewed, patient examined, no change in status, stable for surgery.  I have reviewed the patient's chart and labs.  Questions were answered to the patient's satisfaction.     Moyses Pavey C

## 2014-01-29 NOTE — H&P (Signed)
PCP is Amy Hare, MD Referring Provider is Norins, Heinz Knuckles, MD; Amy Mead MD    Chief Complaint   Patient presents with   .  Lung Lesion       Surgical eval, PET Scan 11/29/13         HPI: 73 year old woman sent for consultation regarding an enlarging left lower lobe lung nodule.   Amy Dunn is a 73 year old woman with an insignificant smoking history (< a pack a day for < 5 years). She has had a chronic dry cough for several years now. This has been attributed to reflux, which she does have. In 2013 she had a CT of the chest as part of the workup for cough. She was noted to have a groundglass opacity in the left lower lobe. This has been followed at six-month intervals since that time. She recently had a PET/CT, which showed an increase in the size of the nodule. It was mildly hypermetabolic. There were no other suspicious findings.   She states that she has lost 2 pounds over the past 6 months. She continues to have a dry cough. She denies hemoptysis. She is active and does not had any problems with chest pain, tightness, or pressure. She denies shortness of breath at rest or with exertion. She denies wheezing.   ECOG/ZUBROD= 0      Past Medical History   Diagnosis  Date   .  History of colonic polyps     .  Acute peptic ulcer, unspecified site, with hemorrhage and perforation, without mention of obstruction     .  Osteoarthritis of left shoulder     .  Primary osteoarthritis of left hip     .  Pulmonary nodule, left         Left Lower lobe   .  GERD (gastroesophageal reflux disease)     .  Cough     .  Hypertension     .  TIA (transient ischemic attack)  2011       transient blindness, speech- resolved         Past Surgical History   Procedure  Laterality  Date   .  Colon surgery       .  Surgical repair of peptic ulcer       .  Tubal ligation             Family History   Problem  Relation  Age of Onset   .  Hypertension  Mother     .  Heart disease   Mother     .  Heart attack  Mother     .  Cancer  Father         Colon cancer        Social History History   Substance Use Topics   .  Smoking status:  Former Smoker -- 1 years       Types:  Cigarettes       Quit date:  05/26/1981   .  Smokeless tobacco:  Never Used         Comment: 2 ciggs per day   .  Alcohol Use:  No         Current Outpatient Prescriptions   Medication  Sig  Dispense  Refill   .  atenolol (TENORMIN) 50 MG tablet  take 1 tablet by mouth once daily   30 tablet   11   .  bimatoprost (LUMIGAN) 0.03 % ophthalmic solution  Place 1 drop into the left eye at bedtime.         .  clopidogrel (PLAVIX) 75 MG tablet  take 1 tablet by mouth once daily WITH BREAKFAST   30 tablet   11   .  dorzolamide-timolol (COSOPT) 22.3-6.8 MG/ML ophthalmic solution  Place 1 drop into the right eye 2 (two) times daily.         .  fexofenadine (ALLEGRA) 180 MG tablet  Take 180 mg by mouth daily as needed. For allergies         .  fluticasone (FLONASE) 50 MCG/ACT nasal spray  Place 2 sprays into the nose daily.   16 g   2   .  guaiFENesin (MUCINEX) 600 MG 12 hr tablet  Take 600 mg by mouth 2 (two) times daily as needed.          Marland Kitchen  losartan-hydrochlorothiazide (HYZAAR) 100-25 MG per tablet  take 1 tablet by mouth once daily   90 tablet   3   .  omeprazole (PRILOSEC) 20 MG capsule  Take 1 capsule (20 mg total) by mouth daily.   30 capsule   3   .  promethazine-codeine (PHENERGAN WITH CODEINE) 6.25-10 MG/5ML syrup  Take 5 mLs by mouth as needed.         .  temazepam (RESTORIL) 15 MG capsule  Take 1 capsule (15 mg total) by mouth at bedtime as needed for sleep.   30 capsule   1   .  traMADol (ULTRAM) 50 MG tablet  Take 1 tablet (50 mg total) by mouth every 6 (six) hours as needed for moderate pain.   100 tablet   2       No current facility-administered medications for this visit.         Allergies   Allergen  Reactions   .  Amlodipine         Trouble swallowing   .  Penicillins          REACTION: itching        Review of Systems  Constitutional: Negative for fever, chills, diaphoresis, activity change, fatigue and unexpected weight change.  Respiratory: Positive for cough. Negative for chest tightness, shortness of breath, wheezing and stridor.   Cardiovascular: Negative for chest pain, palpitations and leg swelling.  Gastrointestinal:        Heartburn, reflux, history of esophageal dilatation  Neurological: Positive for numbness.        TIA in 2011 with transient blindness and slurred speech, completely resolved.  Hematological: Bruises/bleeds easily (No history of excessive bleeding with prior surgeries,is on Plavix).  All other systems reviewed and are negative.     BP 130/79  Pulse 70  Resp 20  Ht 5\' 3"  (1.6 m)  Wt 128 lb (58.06 kg)  BMI 22.68 kg/m2  SpO2 98% Physical Exam  Vitals reviewed. Constitutional: She is oriented to person, place, and time. She appears well-developed and well-nourished. No distress.  HENT:   Head: Normocephalic and atraumatic.  Eyes: EOM are normal. Pupils are equal, round, and reactive to light.  Neck: Neck supple. No thyromegaly present.  Cardiovascular: Normal rate, regular rhythm, normal heart sounds and intact distal pulses.  Exam reveals no gallop and no friction rub.    No murmur heard. Pulmonary/Chest: Effort normal and breath sounds normal. She has no wheezes. She has no rales.  Abdominal: Soft. There is no tenderness.  Musculoskeletal: She exhibits no edema.  Lymphadenopathy:  She has no cervical adenopathy.  Neurological: She is alert and oriented to person, place, and time. No cranial nerve deficit.  No focal deficits  Skin: Skin is warm and dry.        Diagnostic Tests: NUCLEAR MEDICINE PET SKULL BASE TO THIGH   TECHNIQUE:   5.8 mCi F-18 FDG was injected intravenously. Full-ring PET imaging   was performed from the skull base to thigh after the radiotracer. CT   data was obtained and used for  attenuation correction and anatomic   localization.   FASTING BLOOD GLUCOSE: Value: 95 mg/dl   COMPARISON: CT chest 03/29/2013, 09/08/2012, 07/28/2011 and   07/24/2010.   FINDINGS:   NECK   No hypermetabolic lymph nodes in the neck. CT images show no acute   findings. There is periventricular low attenuation within the deep   white matter, indicative of chronic microvascular white matter   ischemic changes. Visualized portions of the paranasal sinuses and   mastoid air cells are clear.   CHEST   A spiculated sub solid nodule in the left lower lobe measures 1.2 x   1.6 cm with the internal solid component measuring approximately 4   mm, medially. Lesion has an SUV max of 1.6. No hypermetabolic   mediastinal or hilar lymph nodes.   CT images show no pericardial or pleural effusion.   ABDOMEN/PELVIS   No abnormal hypermetabolism in the liver, adrenal glands, spleen or   pancreas. No hypermetabolic lymph nodes.   CT images show a low-attenuation lesions in the right hepatic lobe   measuring up to 1.7 cm, unchanged and likely cysts. Intrahepatic and   extrahepatic biliary duct dilatation appears unchanged from   08/26/2011. Extrahepatic bile duct measures approximately 12 mm.   Gallbladder and adrenal glands are unremarkable. Low-attenuation   lesions in the kidneys measure up to 1.8 cm on the right, similar to   08/26/2011. A 1.7 cm low-attenuation lesion in the interpolar right   kidney has mild high attenuation medially (series 4, image 104) but   is unchanged in size from 08/26/2011, suggesting a mildly complex   cyst. Spleen, pancreas, stomach and bowel are otherwise grossly   unremarkable. No free fluid.   SKELETON   No abnormal osseous hypermetabolism.   IMPRESSION:   Progressively enlarging sub solid left lower lobe nodule with low   level FDG activity. 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 If this is a non-small cell lung cancer, it is most    consistent with stage IA disease.   Electronically Signed   By: Lorin Picket M.D.   On: 11/29/2013 11:11   Pulmonary function test FVC 2.63 (124%) FEV1 2.13 (131%) FEV1 to FVC ratio 0.8 DLCO 70% of predicted   Impression: 73 year old woman with a groundglass opacity that has slowly increased in size over time. It is mildly hypermetabolic on PET CT. There also does appear now to be a small solid component. This is highly suspicious for a low-grade adenocarcinoma.   I reviewed the CT and PET images with Amy Dunn. We discussed our options. Given the increase in size we do need a definitive diagnosis. We discussed a bronchoscopic or CT-guided biopsy versus a wedge resection. She understands that the possibility of a false negative bronchoscopic or CT-guided biopsy would render a negative result of a little use. I agree with Dr. Elsworth Soho that the best option is to do a thoracoscopic wedge resection for definitive diagnosis and treatment. That was my recommendation to her.  We discussed the proposed procedure which was left video-assisted thoracoscopy, wedge resection, possible segmentectomy or lobectomy, and cryo- analgesia of the intercostal nerves. We discussed the general nature of the procedure, including the incisions to be used, the need for general anesthesia, expected hospital stay, and overall recovery. We discussed the indications, risks, benefits, and alternatives. She understands that the risk include, but not limited to death, MI, stroke, DVT, PE, bleeding, possible need for transfusion, infection, prolonged air leak, cardiac arrhythmias, as well as the possibility of unforeseeable complications. We discussed the technique a parietal analgesia of the intercostal nerves. She understands this is a relatively new procedure to decrease postoperative pain.   Amy Dunn wishes to proceed but wishes to wait until the end of August to have the procedure done.   Plan: Left VATS, wedge  resection, possible segmentectomy or lobectomy, cryo- analgesia of intercostal nerves on Monday, August 31.   She will need to hold her Plavix for 7 days prior to the procedure

## 2014-01-30 ENCOUNTER — Inpatient Hospital Stay (HOSPITAL_COMMUNITY): Payer: Medicare Other

## 2014-01-30 LAB — BASIC METABOLIC PANEL
ANION GAP: 9 (ref 5–15)
BUN: 15 mg/dL (ref 6–23)
CO2: 25 meq/L (ref 19–32)
Calcium: 8 mg/dL — ABNORMAL LOW (ref 8.4–10.5)
Chloride: 101 mEq/L (ref 96–112)
Creatinine, Ser: 1.1 mg/dL (ref 0.50–1.10)
GFR calc Af Amer: 55 mL/min — ABNORMAL LOW (ref >90)
GFR calc non Af Amer: 48 mL/min — ABNORMAL LOW (ref >90)
GLUCOSE: 125 mg/dL — AB (ref 70–99)
POTASSIUM: 3.4 meq/L — AB (ref 3.7–5.3)
SODIUM: 135 meq/L — AB (ref 137–147)

## 2014-01-30 LAB — POCT I-STAT 3, ART BLOOD GAS (G3+)
BICARBONATE: 25.7 meq/L — AB (ref 20.0–24.0)
O2 Saturation: 98 %
PCO2 ART: 43.7 mmHg (ref 35.0–45.0)
PH ART: 7.379 (ref 7.350–7.450)
Patient temperature: 98.9
TCO2: 27 mmol/L (ref 0–100)
pO2, Arterial: 109 mmHg — ABNORMAL HIGH (ref 80.0–100.0)

## 2014-01-30 LAB — CBC
HCT: 25.2 % — ABNORMAL LOW (ref 36.0–46.0)
HEMOGLOBIN: 9.1 g/dL — AB (ref 12.0–15.0)
MCH: 27.8 pg (ref 26.0–34.0)
MCHC: 36.1 g/dL — ABNORMAL HIGH (ref 30.0–36.0)
MCV: 77.1 fL — ABNORMAL LOW (ref 78.0–100.0)
PLATELETS: 147 10*3/uL — AB (ref 150–400)
RBC: 3.27 MIL/uL — AB (ref 3.87–5.11)
RDW: 13.7 % (ref 11.5–15.5)
WBC: 12.4 10*3/uL — AB (ref 4.0–10.5)

## 2014-01-30 MED ORDER — LEVALBUTEROL HCL 0.63 MG/3ML IN NEBU: 0.6300 mg | INHALATION_SOLUTION | Freq: Four times a day (QID) | RESPIRATORY_TRACT | Status: DC | PRN

## 2014-01-30 MED ORDER — POTASSIUM CHLORIDE CRYS ER 20 MEQ PO TBCR
20.0000 meq | EXTENDED_RELEASE_TABLET | Freq: Two times a day (BID) | ORAL | Status: AC
  Administered 2014-01-30 (×2): 20 meq via ORAL
  Filled 2014-01-30 (×2): qty 1

## 2014-01-30 MED ORDER — POTASSIUM CHLORIDE 10 MEQ/50ML IV SOLN
10.0000 meq | INTRAVENOUS | Status: AC | PRN
  Administered 2014-01-30 (×3): 10 meq via INTRAVENOUS

## 2014-01-30 NOTE — Progress Notes (Signed)
Pt admitted into 3S10 from 2S10. Oriented to room and all questions answered. Vital signs all WDL.

## 2014-01-30 NOTE — Progress Notes (Addendum)
[facility] DAILY ICU PROGRESS NOTE                   [address]            [place] [address]          [phone]   1 Day Post-Op Procedure(s) (LRB): VIDEO ASSISTED THORACOSCOPY (VATS)/BASILAR SEGMENTECTOMY  LEFT LUNG LOWER LOBE WITH FROZEN SECTION, LEFT LOWER LOBECTOMY (Left) CRYO INTERCOSTAL NERVE BLOCK (N/A)  Total Length of Stay:  LOS: 1 day   Subjective: OOB in chair.  Comfortable, pain controlled.  Stable overnight.     Objective: Vital signs in last 24 hours: Temp:  [97.5 F (36.4 C)-98.9 F (37.2 C)] 98.9 F (37.2 C) (09/01 0333) Pulse Rate:  [51-90] 67 (09/01 0700) Cardiac Rhythm:  [-] Normal sinus rhythm;Sinus bradycardia (09/01 0700) Resp:  [9-24] 14 (09/01 0700) BP: (90-136)/(49-80) 90/50 mmHg (09/01 0700) SpO2:  [98 %-100 %] 98 % (09/01 0700) Arterial Line BP: (96-153)/(41-79) 96/41 mmHg (09/01 0700) Weight:  [131 lb 13.4 oz (59.8 kg)] 131 lb 13.4 oz (59.8 kg) (09/01 0600)  Filed Weights   01/29/14 0600 01/30/14 0600  Weight: 126 lb (57.153 kg) 131 lb 13.4 oz (59.8 kg)    Weight change: 5 lb 13.4 oz (2.647 kg)   Hemodynamic parameters for last 24 hours:    Intake/Output from previous day: 08/31 0701 - 09/01 0700 In: 3147 [P.O.:480; I.V.:2467; IV Piggyback:200] Out: 2215 [Urine:1475; Blood:200; Chest Tube:540]  Intake/Output this shift:    Current Meds: Scheduled Meds: . acetaminophen  1,000 mg Oral 4 times per day   Or  . acetaminophen (TYLENOL) oral liquid 160 mg/5 mL  1,000 mg Oral 4 times per day  . atenolol  50 mg Oral Daily  . bisacodyl  10 mg Oral Daily  . dorzolamide-timolol  1 drop Left Eye Daily  . fentaNYL   Intravenous 6 times per day  . losartan  100 mg Oral Daily   And  . hydrochlorothiazide  25 mg Oral Daily  . levalbuterol  0.63 mg Nebulization Q6H  . loratadine  10 mg Oral Daily  . metoCLOPramide (REGLAN) injection  10 mg Intravenous 4 times per day  . pantoprazole  40 mg Oral Daily  . senna-docusate  1  tablet Oral QHS   Continuous Infusions: . dextrose 5 % and 0.9% NaCl 100 mL/hr at 01/30/14 0700   PRN Meds:.diphenhydrAMINE, diphenhydrAMINE, fluticasone, naloxone, ondansetron (ZOFRAN) IV, oxyCODONE, potassium chloride, potassium chloride, sodium chloride, traMADol   Physical Exam: General appearance: alert, cooperative and no distress Heart: regular rate and rhythm Lungs: Coarse bilateral BS Wound: Dressed and dry Chest tube: No air leak, but poor cough effort   Lab Results: CBC: Recent Labs  01/29/14 1217 01/30/14 0450  WBC  --  12.4*  HGB 8.5* 9.1*  HCT 25.0* 25.2*  PLT  --  147*   BMET:  Recent Labs  01/29/14 1217 01/30/14 0450  NA 137 135*  K 2.8* 3.4*  CL  --  101  CO2  --  25  GLUCOSE  --  125*  BUN  --  15  CREATININE  --  1.10  CALCIUM  --  8.0*    PT/INR: No results found for this basename: LABPROT, INR,  in the last 72 hours Radiology: Chest 2 View  01/29/2014   CLINICAL DATA:  Preoperative radiograph for left lung nodule removal.  EXAM: CHEST  2 VIEW  COMPARISON:  11/29/2013 PET-CT  FINDINGS: Normal heart  size. Ectatic aorta. Lungs mildly hyperinflated. Left lower lobe nodule is poorly characterized by radiograph. No focal consolidation, pleural effusion, or pneumothorax. No acute osseous finding.  IMPRESSION: No radiographic evidence of active cardiopulmonary disease.  See recent PET-CT report regarding the left lower lobe pulmonary nodule.   Electronically Signed   By: Carlos Levering M.D.   On: 01/29/2014 05:58   Dg Chest Port 1 View  01/29/2014   CLINICAL DATA:  Status post central line placement  EXAM: PORTABLE CHEST - 1 VIEW  COMPARISON:  8/31/fifth  FINDINGS: Postoperative changes are now seen on the left consistent with a recent history. Two thoracostomy catheters are noted. No pneumothorax is seen. A right central venous line is noted with the tip in the mid superior vena cava. No pneumothorax is seen. No focal infiltrate is noted. The cardiac  shadow is stable.  IMPRESSION: Postoperative change on the left.  Tubes and lines as described above.  No pneumothorax is noted.  These results were called by telephone at the time of interpretation on 01/29/2014 at 1:51 pm to [facility], the pts nurse in PACU, who verbally acknowledged these results.   Electronically Signed   By: Inez Catalina M.D.   On: 01/29/2014 13:52     Assessment/Plan: S/P Procedure(s) (LRB): VIDEO ASSISTED THORACOSCOPY (VATS)/BASILAR SEGMENTECTOMY  LEFT LUNG LOWER LOBE WITH FROZEN SECTION, LEFT LOWER LOBECTOMY (Left) CRYO INTERCOSTAL NERVE BLOCK (N/A) Doing well POD #1.   Mobilize, continue IS/pulm toilet. CTs with no air leak, CXR stable.  Could possibly decrease CTs to water seal. Leave Foley one more day to measure volume, decrease IVF, d/c A-line. Hypokalemia- replacing K+ per protocol. Hopefully can tx stepdown.  COLLINS,GINA H 01/30/2014 7:36 AM  Patient seen and examined, agree with above CXR looks god No air leak- CT to water seal Path pending txf [phone]

## 2014-01-30 NOTE — Op Note (Signed)
NAMELABRIA, WOS                  ACCOUNT NO.:  [id number]  MEDICAL RECORD NO.:  [id number]  LOCATION:  2S10C                        FACILITY:  [facility]  PHYSICIAN:  Revonda Standard. Roxan Hockey, M.D.DATE OF BIRTH:  1940/12/19  DATE OF PROCEDURE:  01/29/2014 DATE OF DISCHARGE:                              OPERATIVE REPORT   PREOPERATIVE DIAGNOSIS:  Left lower lobe lung nodule.  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  PROCEDURE:   Video-assisted thoracoscopy with limited thoracotomy, Left lower lobe wedge resection, Left lower lobe basilar segmentectomy, Lymph node dissection, Cryoanalgesia of intercostal nerves.  SURGEON:  Revonda Standard. Roxan Hockey, M.D.  ASSISTANT:  Ellwood Handler, PA-C.  ANESTHESIA:  General.  FINDINGS:  Incomplete fissure. Aberrant takeoff of the lingular bronchus.  Frozen section of nodule showed likely well differentiated adenocarcinoma with adenomatous hyperplasia of margin, therefore, basilar segmentectomy performed.  CLINICAL NOTE:  Ms. Harb is a 73 year old woman with a remote history of tobacco use.  She has been followed for a ground-glass nodule in the left lower lobe.  This had remained stable in size, but recently developed more of a soft tissue component.  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  She was advised to undergo wedge resection and possible segmentectomy or lobectomy depending on intraoperative findings.  The indications risks, benefits, and alternatives were discussed in detail with the patient.  She understood and accepted the risks and agreed to proceed.  We also discussed use of cryoanalgesia of the intercostal nerves to assist with postoperative pain control.  She wished to have that done.  OPERATIVE NOTE:  Ms. Eubanks was brought to the preoperative holding area on January 29, 2014, there Anesthesia placed a  central line and arterial blood pressure monitoring line.  She was taken to the operating room, anesthetized, and intubated.  A Foley catheter was placed.  Sequential compressive devices were placed on the legs for DVT prophylaxis.  She was placed in a right lateral decubitus position and the left chest was prepped and draped in the usual sterile fashion.  Single lung ventilation of the right lung was initiated and was tolerated well throughout the procedure.  An incision was made in the seventh intercostal space in the midaxillary line and was carried through the skin and subcutaneous tissue.  The chest was entered bluntly using a 5 mm port and the thoracoscope was advanced into the chest.  There was good isolation of the left lung, although it did take a while for the left lung to deflate.  There was no cross ventilation.  A small working incision was made in the fourth intercostal space anterolaterally.  Initially no rib spreading was performed.  The cryoanalgesia probe was used to freeze the intercostal nerves beginning at the 3rd interspace and progressing down to the 8th interspace.  The probe was taken -70 degrees Celsius for 2 minutes at each interspace. The lower lobe was grasped.  The fissure was incomplete.  The lesion was faintly palpable along the lateral aspect near the major fissure in the midportion of the lung.  A wedge resection was  performed incorporating this area.  Prior to performing the wedge resection, a portion of the fissure was dissected out, but the pulmonary artery could not be Identified at this point.  The specimen was placed into endoscopic retrieval bag andremoved through the incision and it was sent for frozen section.  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  No definite invasive cancer was seen.  There was adenomatous hyperplasia at the margin.   The decision was made to perform a basilar segmentectomy to ensure an adequate  margin on the lesion.  The inferior ligament was divided, 2 nodes were identified and were sent separately to pathology.  All nodes that were encountered during the dissection were taken and sent to pathology for permanents.  The pleural reflection was divided at the hilum, both posteriorly and anteriorly.  Superiorly, a large AP window node was identified and removed.  The dissection was carried into the fissure.  The fissure between the lingular segment of the upper lobe and the lower lobe was completed with sequential firings of an endoscopic stapler.  At this point, the pulmonary artery was identified in the fissure and the remaining portion of the fissure was stapled exposing the pulmonary arterial branches. There were 2 superior segmental branches.  These were preserved.  There were 3 basilar segmental branches, which were divided with an endoscopic GIA stapler.  At this point, it was apparent that there was a staple line across the lingular segmental bronchus. This most likely had occurred during the stapling of the fissure. The upper lobe bronchus proper was intact.   The incision was lengthened and a retractor was placed to improve access.  The bronchial side of the staple line was cut out.  There were some thick secretions within the bronchial lumen, which were evacuated.  The staple line was removed on the lingular portion, but a corresponding bronchial lumen could not be identified clearly.  With a test inflation of the upper lobe, there was good aeration of the upper lobe including the lingular segment.  The lingular segmental bronchus then was oversewn with interrupted 3-0 PDS sutures.  The basilar segmental bronchi of the lower lobe were identified.  The superior segmental bronchus was identified and it was preserved.  A stapler was placed across the basilar segmental bronchi. A test inflation showed good aeration of the upper lobe and the superior segment of the lower lobe. The stapler was then  fired dividing the basilar segmental bronchi.  The superior segmental vein was identified and preserved.  The parenchymal division was completed with sequential firings of an endoscopic stapler using both purple and black cartridges.  A final inspection was made for hemostasis.  The chest was filled with warm saline.  A test inflation to 30 cm of water revealed no leakage from the bronchial stump.  There was a small parenchymal leak from the lingula, which was oversewn with a 3-0 Vicryl suture and then ProGEL was applied to that area.  A 32-French Blake drain was placed through the original port incision and directed posteriorly.  A 28- French chest tube was placed through a separate port incision and directed anteriorly.  A single pericostal suture was placed to reapproximate the ribs.  The wound then was closed with a #1 Vicryl fascial suture followed by 2-0 Vicryl subcutaneous suture and a 3-0 Vicryl subcuticular suture.  All sponge, needle, and instrument counts were correct at the end of the procedure.  The patient was taken from the operating room to the  postanesthetic care unit in good condition.     Revonda Standard Roxan Hockey, M.D.     SCH/MEDQ  D:  01/29/2014  T:  01/30/2014  Job:  [id number]

## 2014-01-30 NOTE — Progress Notes (Signed)
Transferred to 3S10 via wheelchair. Portable monitor and oxygen on. No changes. Report given to [place], [place]

## 2014-01-31 ENCOUNTER — Inpatient Hospital Stay (HOSPITAL_COMMUNITY): Payer: Medicare Other

## 2014-01-31 ENCOUNTER — Encounter (HOSPITAL_COMMUNITY): Payer: Self-pay | Admitting: Thoracic Surgery (Cardiothoracic Vascular Surgery)

## 2014-01-31 LAB — CBC
HCT: 24.2 % — ABNORMAL LOW (ref 36.0–46.0)
Hemoglobin: 8.8 g/dL — ABNORMAL LOW (ref 12.0–15.0)
MCH: 28.9 pg (ref 26.0–34.0)
MCHC: 36.4 g/dL — ABNORMAL HIGH (ref 30.0–36.0)
MCV: 79.6 fL (ref 78.0–100.0)
PLATELETS: 148 10*3/uL — AB (ref 150–400)
RBC: 3.04 MIL/uL — ABNORMAL LOW (ref 3.87–5.11)
RDW: 14.2 % (ref 11.5–15.5)
WBC: 14.9 10*3/uL — AB (ref 4.0–10.5)

## 2014-01-31 LAB — COMPREHENSIVE METABOLIC PANEL
ALBUMIN: 2.5 g/dL — AB (ref 3.5–5.2)
ALT: 16 U/L (ref 0–35)
AST: 27 U/L (ref 0–37)
Alkaline Phosphatase: 91 U/L (ref 39–117)
Anion gap: 7 (ref 5–15)
BUN: 11 mg/dL (ref 6–23)
CALCIUM: 7.6 mg/dL — AB (ref 8.4–10.5)
CO2: 25 mEq/L (ref 19–32)
CREATININE: 1.08 mg/dL (ref 0.50–1.10)
Chloride: 107 mEq/L (ref 96–112)
GFR calc Af Amer: 57 mL/min — ABNORMAL LOW (ref >90)
GFR calc non Af Amer: 49 mL/min — ABNORMAL LOW (ref >90)
Glucose, Bld: 124 mg/dL — ABNORMAL HIGH (ref 70–99)
Potassium: 4.4 mEq/L (ref 3.7–5.3)
Sodium: 139 mEq/L (ref 137–147)
Total Bilirubin: 0.3 mg/dL (ref 0.3–1.2)
Total Protein: 5.6 g/dL — ABNORMAL LOW (ref 6.0–8.3)

## 2014-01-31 NOTE — Progress Notes (Signed)
dc'ed anterior chest tube per md order. Patient tolerated procedure well.. Followed policies and procedures. Dressing is clean dry and intact. Posterior chest tube is to water seal with no air leak. VSS. Will continue to monitor. Portable chest xray is ordered for tomorrow am.

## 2014-01-31 NOTE — Progress Notes (Addendum)
[address]       [place] [address]             [phone]          2 Days Post-Op Procedure(s) (LRB): VIDEO ASSISTED THORACOSCOPY (VATS)/BASILAR SEGMENTECTOMY  LEFT LUNG LOWER LOBE WITH FROZEN SECTION, LEFT LOWER LOBECTOMY (Left) CRYO INTERCOSTAL NERVE BLOCK (N/A)  Subjective: Comfortable, no complaints.  Rested well, walking in halls.  Breathing stable.   Objective: Vital signs in last 24 hours: Patient Vitals for the past 24 hrs:  BP Temp Temp src Pulse Resp SpO2  01/31/14 0738 - - - - 17 99 %  01/31/14 0400 122/73 mmHg 99.3 F (37.4 C) Oral - - -  01/31/14 0358 - - - - 17 100 %  01/30/14 2355 122/63 mmHg 100.7 F (38.2 C) Oral 112 17 100 %  01/30/14 2224 - - - - 16 100 %  01/30/14 2009 - - - - 20 100 %  01/30/14 1943 139/79 mmHg 99.8 F (37.7 C) Oral 98 25 98 %  01/30/14 1924 - - Oral - - -  01/30/14 1502 96/53 mmHg 98.9 F (37.2 C) Oral 71 15 97 %  01/30/14 1214 127/73 mmHg 98.8 F (37.1 C) Oral 74 16 100 %  01/30/14 1022 137/55 mmHg 98.6 F (37 C) Oral 66 16 100 %  01/30/14 0900 92/51 mmHg - - 62 11 99 %   Current Weight  01/30/14 131 lb 13.4 oz (59.8 kg)     Intake/Output from previous day: 09/01 0701 - 09/02 0700 In: 2190 [P.O.:240; I.V.:1800; IV Piggyback:150] Out: 2510 [Urine:2265; Chest Tube:245]    PHYSICAL EXAM:  Heart: RRR Lungs: Few coarse BS that clear with cough Wound: Clean and dry Chest tube: No air leak    Lab Results: CBC: Recent Labs  01/30/14 0450 01/31/14 0400  WBC 12.4* 14.9*  HGB 9.1* 8.8*  HCT 25.2* 24.2*  PLT 147* 148*   BMET:  Recent Labs  01/30/14 0450 01/31/14 0400  NA 135* 139  K 3.4* 4.4  CL 101 107  CO2 25 25  GLUCOSE 125* 124*  BUN 15 11  CREATININE 1.10 1.08  CALCIUM 8.0* 7.6*    PT/INR: No results found for this basename: LABPROT, INR,  in the last 72 hours  CXR: FINDINGS:  Trachea is midline. Heart size stable. Right IJ central line tip  projects over the SVC.  Two left chest tubes terminate at the apex of  the left hemi thorax.  There are postoperative changes in the left hemi thorax with  bibasilar airspace disease, left greater than right. No definite  pneumothorax. Surgical clips in the left breast.  IMPRESSION:  1. Postoperative changes in the left hemithorax with 2 left chest  tubes in place. No definite pneumothorax.  2. Bibasilar airspace disease, left greater than right.   Path: Well differentiated adenocarcinoma (T1a, N0)   Assessment/Plan: S/P Procedure(s) (LRB): VIDEO ASSISTED THORACOSCOPY (VATS)/BASILAR SEGMENTECTOMY  LEFT LUNG LOWER LOBE WITH FROZEN SECTION, LEFT LOWER LOBECTOMY (Left) CRYO INTERCOSTAL NERVE BLOCK (N/A) CXR stable, CT with no air leak.  Will d/c anterior CT, leave remaining CT to water seal. Pulm- Continue IS/pulm toilet, add flutter valve. Expected postop blood loss anemia- H/H down slightly, continue to monitor. Mild leukocytosis- likely due to atelectasis.  No fever.  Will watch. KVO IV, continue PCA, ambulate in halls. Path reviewed with patient by Dr. Roxan Hockey.   LOS: 2 days    COLLINS,GINA  H 01/31/2014  Patient seen and examined, agree with above PATH- stage 1A well differentiated adenocarcinoma- will not require adjuvant chemo

## 2014-02-01 ENCOUNTER — Inpatient Hospital Stay (HOSPITAL_COMMUNITY): Payer: Medicare Other

## 2014-02-01 MED ORDER — OXYCODONE HCL 5 MG PO TABS
5.0000 mg | ORAL_TABLET | ORAL | Status: DC | PRN
  Administered 2014-02-01 – 2014-02-02 (×3): 5 mg via ORAL
  Filled 2014-02-01 (×3): qty 1

## 2014-02-01 MED ORDER — POLYETHYLENE GLYCOL 3350 17 G PO PACK
17.0000 g | PACK | Freq: Every day | ORAL | Status: DC
  Administered 2014-02-01 – 2014-02-03 (×2): 17 g via ORAL
  Filled 2014-02-01 (×3): qty 1

## 2014-02-01 MED ORDER — POLYETHYLENE GLYCOL 3350 17 G PO PACK
17.0000 g | PACK | Freq: Once | ORAL | Status: AC
  Administered 2014-02-01: 17 g via ORAL
  Filled 2014-02-01 (×2): qty 1

## 2014-02-01 NOTE — Progress Notes (Addendum)
       [address]       [place] [address]             [phone]          3 Days Post-Op Procedure(s) (LRB): VIDEO ASSISTED THORACOSCOPY (VATS)/BASILAR SEGMENTECTOMY  LEFT LUNG LOWER LOBE WITH FROZEN SECTION, LEFT LOWER LOBECTOMY (Left) CRYO INTERCOSTAL NERVE BLOCK (N/A)  Subjective: Feels well, no complaints.     Objective: Vital signs in last 24 hours: Patient Vitals for the past 24 hrs:  BP Temp Temp src Pulse Resp SpO2  02/01/14 0422 115/64 mmHg 98.4 F (36.9 C) Oral 90 17 94 %  01/31/14 2359 117/66 mmHg 98.6 F (37 C) Oral 87 16 94 %  01/31/14 1950 129/74 mmHg 99.2 F (37.3 C) Oral 87 23 97 %  01/31/14 1708 110/79 mmHg - - 87 18 -  01/31/14 1600 - - - - 15 98 %  01/31/14 1500 98/53 mmHg 98.7 F (37.1 C) Oral 77 16 96 %  01/31/14 1215 101/55 mmHg 98.9 F (37.2 C) Oral 77 14 98 %  01/31/14 1200 - - - - 22 98 %  01/31/14 0938 118/60 mmHg - - 90 - -  01/31/14 0818 118/60 mmHg 98.2 F (36.8 C) Oral 87 17 97 %  01/31/14 0806 113/61 mmHg - - 79 18 -   Current Weight  01/30/14 131 lb 13.4 oz (59.8 kg)     Intake/Output from previous day: 09/02 0701 - 09/03 0700 In: 600 [P.O.:240; I.V.:360] Out: 670 [Urine:500; Chest Tube:170]    PHYSICAL EXAM:  Heart: RRR Lungs: Diminished BS in bases, overall clear Wound: Clean and dry Chest tube: No air leak    Lab Results: CBC: Recent Labs  01/30/14 0450 01/31/14 0400  WBC 12.4* 14.9*  HGB 9.1* 8.8*  HCT 25.2* 24.2*  PLT 147* 148*   BMET:  Recent Labs  01/30/14 0450 01/31/14 0400  NA 135* 139  K 3.4* 4.4  CL 101 107  CO2 25 25  GLUCOSE 125* 124*  BUN 15 11  CREATININE 1.10 1.08  CALCIUM 8.0* 7.6*    PT/INR: No results found for this basename: LABPROT, INR,  in the last 72 hours    Assessment/Plan: S/P Procedure(s) (LRB): VIDEO ASSISTED THORACOSCOPY (VATS)/BASILAR SEGMENTECTOMY  LEFT LUNG LOWER LOBE WITH FROZEN SECTION, LEFT LOWER LOBECTOMY (Left) CRYO INTERCOSTAL NERVE  BLOCK (N/A) CT with no air leak, CXR stable with no ptx.  Hopefully can d/c remaining CT today. Once CT out, will d/c IVF, PCA, central line. Continue ambulation, pulm toilet. Home 1-2 days if she remains stable.   LOS: 3 days    COLLINS,GINA H 02/01/2014  Patient seen and examined, agree with above Drainage down and no air leak- dc CT Has not had a BM- miralax this AM Hopefully home in AM  Case discussed in [place]- stage IA- no adjuvant therapy

## 2014-02-01 NOTE — Progress Notes (Signed)
Pt's oxygen saturations slightly decreased, only able to get 228ml on incentive spirometer was getting 532ml early today, left lung bases sounds are diminished. Re-educated both patient and family on importance of deep breathing, coughing, and frequent  incentive spirometer use. Dr. Roxy Manns called, new orders received. Will continue to monitor.

## 2014-02-01 NOTE — Discharge Summary (Signed)
Physician Discharge Summary  Patient ID: Amy Dunn MRN: [id number] DOB/AGE: Mar 05, 1941 73 y.o.  Admit date: 01/29/2014 Discharge date: 02/03/2014  Admission Diagnoses: LEFT LOWER LOBE NODULE Patient Active Problem List   Diagnosis Date Noted  . Chronic cough 03/24/2013  . Healthcare maintenance 03/14/2013  . Benign meningioma of brain 11/03/2012  . Allergic rhinitis 10/10/2012  . TIA (transient ischemic attack) 01/17/2012  . Left flank pain, chronic 08/19/2011  . COLONIC POLYPS, HYPERPLASTIC 03/06/2010  . ACUT PEPTC ULCR UNS SITE W/HEMORR&PERF W/O OBST 03/06/2010  . OSTEOARTHRITIS, SHOULDER, LEFT 03/06/2010  . OSTEOARTHRITIS, HIP, LEFT 03/06/2010  . PULMONARY NODULE, LEFT LOWER LOBE 02/17/2010  . HYPERTENSION 01/30/2009  . G E REFLUX 01/30/2009   Discharge Diagnoses:  STAGE 1A ADENOCARCINOMA LEFT LOWER LOBE  Patient Active Problem List   Diagnosis Date Noted  . S/P partial lobectomy of lung 01/29/2014  . Chronic cough 03/24/2013  . Healthcare maintenance 03/14/2013  . Benign meningioma of brain 11/03/2012  . Allergic rhinitis 10/10/2012  . TIA (transient ischemic attack) 01/17/2012  . Left flank pain, chronic 08/19/2011  . COLONIC POLYPS, HYPERPLASTIC 03/06/2010  . ACUT PEPTC ULCR UNS SITE W/HEMORR&PERF W/O OBST 03/06/2010  . OSTEOARTHRITIS, SHOULDER, LEFT 03/06/2010  . OSTEOARTHRITIS, HIP, LEFT 03/06/2010  . PULMONARY NODULE, LEFT LOWER LOBE 02/17/2010  . HYPERTENSION 01/30/2009  . G E REFLUX 01/30/2009   Discharged Condition: good  History of Present Illness:   Ms. Amy Dunn is a 73 yo white female referred to [facility] for a left lower lobe lung nodule.  The patient has had a dry cough for several years.  It was felt this was attributed to reflux.  In 2013 she underwent CT scan of the chest for workup of her cough.  This showed a ground glass opacity in the left lower lobe.  Since that time she has been routinely followed every 6 months with serial scans.  The nodule  recently enlarged and she was set up for PET/CT scan which showed evidence of mild hypermetabolic activity.  The patient was initially evaluated by Dr. Roxan Hockey on 12/14/2013 at which time it was felt patient should undergo Left VATs procedure with resection of the left lower lobe lung nodule.  The risks and benefits of the procedure were explained to the patient and she was agreeable to proceed.    Hospital Course:   Ms. [name] presented to [facility] on 01/29/2014.  She was taken to the operating room and underwent Left VATS with limited Thoracotomy, Left Lower Lobe Wedge Resection, Left Lower Lobe Basilar Segmentectomy, Lymph node dissection, and Cryoanalgesia on intercostal nerves.  She tolerated the procedure well, was extubated, and taken to the SICU in stable condition.  The patient has progressed nicely since surgery.  Her chest tubes did not exhibit evidence of air leak and were transitioned to water seal.  She was treated for Hypokalemia.  Her chest tubes continued to not exhibit an air leak and her anterior chest tube was removed.  Follow up CXR did not show evidence of pneumothorax and her final chest tube was removed.  She will be taken off her PCA and pain medication will be adjusted to achieve adequate control with oral medications. n The patient developed urinary retention post foley catheter removal.  She was started on Flomax and was able to urinate on her own.  UA was obtained and showed bacturia.  Being she had a foley and require I/O cath we will treat with Ciprofloxacin for 5 days.  Urine  culture is pending.  She is medically stable at this time.  She will have a CXR repeated in the morning and pending no abnormalities she will discharged on 02/03/2014.  Final Pathology is pasted below.       Significant Diagnostic Studies:   PET/CT: Progressively enlarging sub solid left lower lobe nodule with low  level FDG activity. 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 If this is a non-small cell lung cancer, it is most  consistent with stage IA disease  Pathology: 1. Lung, wedge biopsy/resection, Left lower lobe lung - ADENOCARCINOMA, WELL DIFFERENTIATED, SPANNING 1.2 CM. - ADENOCARCINOMA EXTENDS TO THE SURGICAL RESECTION MARGIN OF SPECIMEN # 1. - SEE ONCOLOGY TABLE BELOW. 2. Lymph node, biopsy, 9 L - THERE IS NO EVIDENCE OF ADENOCARCINOMA IN 1 OF 1 LYMPH NODE (0/1). 3. Lymph node, biopsy, 9 L #2 - THERE IS NO EVIDENCE OF ADENOCARCINOMA IN 1 OF 1 LYMPH NODE (0/1). 4. Lymph node, biopsy, 5 L - THERE IS NO EVIDENCE OF ADENOCARCINOMA IN 1 OF 1 LYMPH NODE (0/1). 5. Lymph node, biopsy, 5 L #2 - THERE IS NO EVIDENCE OF ADENOCARCINOMA IN 1 OF 1 LYMPH NODE (0/1). 6. Lung, resection (segmental or lobe), Left lower lobe lung - BENIGN LUNG PARENCHYMA. - THERE IS NO EVIDENCE OF ADENOCARCINOMA IN 1 OF 1 LYMPH NODE (0/1). - THERE IS NO EVIDENCE OF MALIGNANCY. 7. Lymph node, biopsy, 12 L - THERE IS NO EVIDENCE OF ADENOCARCINOMA IN 1 OF 1 LYMPH NODE (0/1). 8. Lymph node, biopsy, 11 L - THERE IS NO EVIDENCE OF ADENOCARCINOMA IN 1 OF 1 LYMPH NODE (0/1). 9. Lymph node, biopsy, 11 L #2 - THERE IS NO EVIDENCE OF ADENOCARCINOMA IN 1 OF 1 LYMPH NODE (0/1).  Treatments: surgery:   Video-assisted thoracoscopy with limited thoracotomy,  Left lower lobe wedge resection,  Left lower lobe basilar segmentectomy,  Lymph node dissection,  Cryoanalgesia of intercostal nerves.  Disposition: 01-Home or Self Care  Discharge medications:     Medication List         atenolol 50 MG tablet  Commonly known as:  TENORMIN  Take 50 mg by mouth daily.     ciprofloxacin 500 MG tablet  Commonly known as:  CIPRO  Take 1 tablet (500 mg total) by mouth 2 (two) times daily. For 5 Days     dorzolamide-timolol 22.3-6.8 MG/ML ophthalmic solution  Commonly known as:  COSOPT  Place 1 drop into the left eye daily.     fexofenadine 180 MG tablet  Commonly known  as:  ALLEGRA  Take 180 mg by mouth daily as needed. For allergies     fluticasone 50 MCG/ACT nasal spray  Commonly known as:  FLONASE  Place 2 sprays into both nostrils daily as needed for allergies or rhinitis.     losartan-hydrochlorothiazide 100-25 MG per tablet  Commonly known as:  HYZAAR  take 1 tablet by mouth once daily     oxyCODONE 5 MG immediate release tablet  Commonly known as:  Oxy IR/ROXICODONE  Take 1-2 tablets (5-10 mg total) by mouth every 4 (four) hours as needed for moderate pain or severe pain.     promethazine-codeine 6.25-10 MG/5ML syrup  Commonly known as:  PHENERGAN with CODEINE  Take 5 mLs by mouth every 6 (six) hours as needed for cough.     tamsulosin 0.4 MG Caps capsule  Commonly known as:  FLOMAX  Take 1 capsule (0.4 mg total) by mouth daily.  traMADol 50 MG tablet  Commonly known as:  ULTRAM  Take 1 tablet (50 mg total) by mouth every 6 (six) hours as needed for moderate pain.     ZEGERID OTC 20-1100 MG Caps capsule  Generic drug:  Omeprazole-Sodium Bicarbonate  Take 1 capsule by mouth daily before breakfast.          Follow-up Information   Follow up with Melrose Nakayama, MD On 02/13/2014. (Appointment is at 10:00)    Specialty:  Cardiothoracic Surgery   Contact information:   [place] [address] [phone]       Follow up with [facility] On 02/13/2014. (Please get CXR at 9:00)    Contact information:          Schedule an appointment as soon as possible for a visit with Adella Hare, MD. (Please set up appointment for 2-4 weeks to assess UTI, and further need for flomax)    Specialty:  Internal Medicine      Signed: Ellwood Handler 02/03/2014, 8:24 AM

## 2014-02-01 NOTE — Care Management Note (Signed)
    Page 1 of 1   02/01/2014     11:56:09 AM CARE MANAGEMENT NOTE 02/01/2014  Patient:  Amy Dunn, Amy Dunn   Account Number:  [id number]  Date Initiated:  01/31/2014  Documentation initiated by:  Marvetta Gibbons  Subjective/Objective Assessment:   Pt admitted s/p VATS     Action/Plan:   PTA pt lived at home- anticipate return home   Anticipated DC Date:  02/03/2014   Anticipated DC Plan:  HOME/SELF CARE         Choice offered to / List presented to:             Status of service:  In process, will continue to follow Medicare Important Message given?  YES (If response is "NO", the following Medicare IM given date fields will be blank) Date Medicare IM given:  02/01/2014 Medicare IM given by:  Marvetta Gibbons Date Additional Medicare IM given:   Additional Medicare IM given by:    Discharge Disposition:    Per UR Regulation:  Reviewed for med. necessity/level of care/duration of stay  If discussed at [place] of Stay Meetings, dates discussed:    Comments:  02/01/14- [place] RN, BSN [phone] Pt progressing well, on RA, no chest tubes- anticipate return home with no needs.

## 2014-02-01 NOTE — Progress Notes (Signed)
Right IJ and last remaining chest tube d/c'd per MD order. Pt tolerated procedure well.

## 2014-02-01 NOTE — Progress Notes (Signed)
Pt had not voided since last night at 2150, bladder scan done with 678ml noted. Pt assisted to ambulated to bathroom and was able to void 254ml. Bladder scan completed post void with 437ml remaining. Pt with no complaints of fullness or pain. Will continue to monitor and assist with voids.

## 2014-02-01 NOTE — CV Procedure (Signed)
Amy Dunn called regarding same day chest x-ray results. No new orders. Will continue to monitor.

## 2014-02-01 NOTE — Progress Notes (Signed)
Pt last voided at 1100 bladder scanned 4hrs later with 638ml noted. Pt assisted to ambulated to bathroom and was able to void 464ml. Bladder scan completed post void with 156ml remaining. Pt with no complaints of fullness or pain. Will continue to monitor and assist with voids.

## 2014-02-01 NOTE — Discharge Instructions (Signed)
Lung Resection, Care After Refer to this sheet in the next few weeks. These instructions provide you with information on caring for yourself after your procedure. Your health care provider may also give you more specific instructions. Your treatment has been planned according to current medical practices, but problems sometimes occur. Call your health care provider if you have any problems or questions after your procedure. WHAT TO EXPECT AFTER THE PROCEDURE After your procedure, it is typical to have the following:   You may feel pain in your chest and throat.  Patients may sometimes shiver or feel nauseous during recovery. HOME CARE INSTRUCTIONS  You may resume a normal diet and activities as directed by your health care provider.  Do not use any tobacco products, including cigarettes, chewing tobacco, or electronic cigarettes. If you need help quitting, ask your health care provider.  There are many different ways to close and cover an incision, including stitches, skin glue, and adhesive strips. Follow your health care provider's instructions on:  Incision care.  Bandage (dressing) changes and removal.  Incision closure removal.  Take medicines only as directed by your health care provider.  Keep all follow-up visits as directed by your health care provider. This is important.  Try to breathe deeply and cough as directed. Holding a pillow firmly over your ribs may help with discomfort.  If you were given an incentive spirometer in the hospital, continue to use it as directed by your health care provider.  Walk as directed by your health care provider.  You may take a shower and gently wash the area of your incision with water and soap as directed by your health care provider. Do not use anything else to clean your incision except as directed by your health care provider. Do not take baths, swim, or use a hot tub until your health care provider approves. SEEK MEDICAL CARE  IF:  You notice redness, swelling, or increasing pain at the incision site.  You are bleeding at the incision site.  You see pus coming from the incision site.  You notice a bad smell coming from the incision site or bandage.  Your incision breaks open.  You cough up blood or pus, or you develop a cough that produces bad-smelling sputum.  You have pain or swelling in your legs.  You have increasing pain that is not controlled with medicine.  You have trouble managing any of the tubes that have been left in place after surgery.  You have fever or chills. SEEK IMMEDIATE MEDICAL CARE IF:   You have chest pain or an irregular or rapid heartbeat.  You have dizzy episodes or faint.  You have shortness of breath or difficulty breathing.  You have persistent nausea or vomiting.  You have a rash. Document Released: 12/05/2004 Document Revised: 10/02/2013 Document Reviewed: 07/07/2013 [facility] Patient Information 2015 [place], [place]. This information is not intended to replace advice given to you by your health care provider. Make sure you discuss any questions you have with your health care provider.

## 2014-02-02 ENCOUNTER — Inpatient Hospital Stay (HOSPITAL_COMMUNITY): Payer: Medicare Other

## 2014-02-02 LAB — BASIC METABOLIC PANEL
ANION GAP: 12 (ref 5–15)
BUN: 18 mg/dL (ref 6–23)
CHLORIDE: 100 meq/L (ref 96–112)
CO2: 24 meq/L (ref 19–32)
CREATININE: 1.29 mg/dL — AB (ref 0.50–1.10)
Calcium: 8.2 mg/dL — ABNORMAL LOW (ref 8.4–10.5)
GFR calc Af Amer: 46 mL/min — ABNORMAL LOW (ref >90)
GFR calc non Af Amer: 39 mL/min — ABNORMAL LOW (ref >90)
Glucose, Bld: 104 mg/dL — ABNORMAL HIGH (ref 70–99)
Potassium: 4.1 mEq/L (ref 3.7–5.3)
Sodium: 136 mEq/L — ABNORMAL LOW (ref 137–147)

## 2014-02-02 LAB — CBC
HEMATOCRIT: 23.4 % — AB (ref 36.0–46.0)
Hemoglobin: 8.3 g/dL — ABNORMAL LOW (ref 12.0–15.0)
MCH: 27.4 pg (ref 26.0–34.0)
MCHC: 35.5 g/dL (ref 30.0–36.0)
MCV: 77.2 fL — ABNORMAL LOW (ref 78.0–100.0)
PLATELETS: 191 10*3/uL (ref 150–400)
RBC: 3.03 MIL/uL — AB (ref 3.87–5.11)
RDW: 14.1 % (ref 11.5–15.5)
WBC: 17.2 10*3/uL — AB (ref 4.0–10.5)

## 2014-02-02 LAB — URINALYSIS, ROUTINE W REFLEX MICROSCOPIC
Glucose, UA: NEGATIVE mg/dL
KETONES UR: 15 mg/dL — AB
NITRITE: NEGATIVE
Protein, ur: NEGATIVE mg/dL
Specific Gravity, Urine: 1.019 (ref 1.005–1.030)
Urobilinogen, UA: 1 mg/dL (ref 0.0–1.0)
pH: 5.5 (ref 5.0–8.0)

## 2014-02-02 LAB — URINE MICROSCOPIC-ADD ON

## 2014-02-02 MED ORDER — LACTULOSE 10 GM/15ML PO SOLN
20.0000 g | Freq: Every day | ORAL | Status: DC | PRN
  Administered 2014-02-02: 20 g via ORAL
  Filled 2014-02-02: qty 30

## 2014-02-02 MED ORDER — BISACODYL 10 MG RE SUPP
10.0000 mg | Freq: Once | RECTAL | Status: AC
  Administered 2014-02-02: 10 mg via RECTAL
  Filled 2014-02-02: qty 1

## 2014-02-02 MED ORDER — TAMSULOSIN HCL 0.4 MG PO CAPS
0.4000 mg | ORAL_CAPSULE | Freq: Every day | ORAL | Status: DC
  Administered 2014-02-02 – 2014-02-03 (×2): 0.4 mg via ORAL
  Filled 2014-02-02 (×2): qty 1

## 2014-02-02 NOTE — Progress Notes (Signed)
Pt unable to void. Bladder scanner reads 491cc. I&O cathed with 550 cc of clear amber urine returned. Will continue to monitor.

## 2014-02-02 NOTE — Progress Notes (Addendum)
Pt had small stool that mixed with urine-unable to send UA at this time-will collect on next void. Bladder scanned after void-150 remains-will continue to monitor for changes.

## 2014-02-02 NOTE — Progress Notes (Addendum)
      Amy Dunn [address]       [place] [address]             [phone]      4 Days Post-Op Procedure(s) (LRB): VIDEO ASSISTED THORACOSCOPY (VATS)/BASILAR SEGMENTECTOMY  LEFT LUNG LOWER LOBE WITH FROZEN SECTION, LEFT LOWER LOBECTOMY (Left) CRYO INTERCOSTAL NERVE BLOCK (N/A)  Subjective:  Amy Dunn is doing okay this morning.  She states her pain is controlled unless she coughs.  However, her daughter states she is not using pain medication until she is so uncomfortable and is not able to obtain much relief.  Her foley catheter was removed yesterday, and patient has not been able to void on her own.  She was I/O cathed once yesterday afternoon.  No BM  Objective: Vital signs in last 24 hours: Temp:  [97.8 F (36.6 C)-100.9 F (38.3 C)] 99.2 F (37.3 C) (09/04 0310) Pulse Rate:  [80-99] 96 (09/04 0310) Cardiac Rhythm:  [-] Normal sinus rhythm (09/04 0310) Resp:  [14-25] 25 (09/04 0310) BP: (93-134)/(55-76) 99/56 mmHg (09/04 0310) SpO2:  [89 %-95 %] 92 % (09/04 0310)  Intake/Output from previous day: 09/03 0701 - 09/04 0700 In: 960 [P.O.:960] Out: 1300 [Urine:1250; Chest Tube:50]  General appearance: alert, cooperative and no distress Heart: regular rate and rhythm Lungs: diminished breath sounds on left Abdomen: soft, non-tender; bowel sounds normal; no masses,  no organomegaly Wound: clean and dry  Lab Results:  Recent Labs  01/31/14 0400 02/02/14 0304  WBC 14.9* 17.2*  HGB 8.8* 8.3*  HCT 24.2* 23.4*  PLT 148* 191   BMET:  Recent Labs  01/31/14 0400 02/02/14 0304  NA 139 136*  K 4.4 4.1  CL 107 100  CO2 25 24  GLUCOSE 124* 104*  BUN 11 18  CREATININE 1.08 1.29*  CALCIUM 7.6* 8.2*    PT/INR: No results found for this basename: LABPROT, INR,  in the last 72 hours ABG    Component Value Date/Time   PHART 7.379 01/30/2014 0501   HCO3 25.7* 01/30/2014 0501   TCO2 27 01/30/2014 0501   O2SAT 98.0 01/30/2014 0501   CBG (last 3)  No results found  for this basename: GLUCAP,  in the last 72 hours  Assessment/Plan: S/P Procedure(s) (LRB): VIDEO ASSISTED THORACOSCOPY (VATS)/BASILAR SEGMENTECTOMY  LEFT LUNG LOWER LOBE WITH FROZEN SECTION, LEFT LOWER LOBECTOMY (Left) CRYO INTERCOSTAL NERVE BLOCK (N/A)  1. CV- H/O HTN, continue home medications, maintaining NSR 2. Pulm- off oxygen, no acute issues, CXR with stable appearance of left hydropneumothorax 3. Renal- Hyponatremic this morning, creatinine mildly elevated- will d/c HCTZ encouraged PO intake 4. GU- urinary retention post foley removal, start Flomax, UA ordered, may need to replace foley if unable to void 5. GI- H/O Irritable bowel, no BM since surgery, + flatus will try Dulcolax suppository, then lactulose prn 6. Dispo- patient with urinary retention, no BM- plan was for discharge today, however these issues need to be addressed prior.  Will monitor patient today, if no urinary output will need to replace foley, work on BM   LOS: 4 days    Ahmed Prima, Junie Panning 02/02/2014   Chart reviewed, patient examined, agree with above. She has significant atelectasis of the left lower lobe on CXR this am. Will repeat tomorrow. Had small BM this afternoon.

## 2014-02-03 ENCOUNTER — Inpatient Hospital Stay (HOSPITAL_COMMUNITY): Payer: Medicare Other

## 2014-02-03 MED ORDER — CIPROFLOXACIN HCL 500 MG PO TABS: 500.0000 mg | ORAL_TABLET | Freq: Two times a day (BID) | ORAL | Status: DC

## 2014-02-03 MED ORDER — LEVOFLOXACIN 500 MG PO TABS: 500.0000 mg | ORAL_TABLET | Freq: Every day | ORAL | Status: DC

## 2014-02-03 MED ORDER — OXYCODONE HCL 5 MG PO TABS: 5.0000 mg | ORAL_TABLET | ORAL | Status: DC | PRN

## 2014-02-03 MED ORDER — TAMSULOSIN HCL 0.4 MG PO CAPS: 0.4000 mg | ORAL_CAPSULE | Freq: Every day | ORAL | Status: DC

## 2014-02-03 NOTE — Progress Notes (Signed)
IV and tele removed, pt getting dressed, pt aware of abx change to levaquin. Pt has Rx. Rickard Rhymes, RN

## 2014-02-03 NOTE — Progress Notes (Addendum)
      [address]       [place] [address]             [phone]      5 Days Post-Op Procedure(s) (LRB): VIDEO ASSISTED THORACOSCOPY (VATS)/BASILAR SEGMENTECTOMY  LEFT LUNG LOWER LOBE WITH FROZEN SECTION, LEFT LOWER LOBECTOMY (Left) CRYO INTERCOSTAL NERVE BLOCK (N/A)  Subjective:  Amy Dunn has no complaints this morning. Continues to have some blood tinged sputum. She was able to urinate on her own yesterday and has had a bowel movement.  Per her daughter she did have a low grade temperature this morning, which has resolved.  Objective: Vital signs in last 24 hours: Temp:  [97.8 F (36.6 C)-101.1 F (38.4 C)] 99.5 F (37.5 C) (09/05 0637) Pulse Rate:  [80-116] 108 (09/05 0637) Cardiac Rhythm:  [-] Normal sinus rhythm (09/05 0807) Resp:  [18-22] 18 (09/05 0503) BP: (83-124)/(49-71) 109/66 mmHg (09/05 0637) SpO2:  [83 %-95 %] 95 % (09/05 0637)  Intake/Output from previous day: 09/04 0701 - 09/05 0700 In: 240 [P.O.:240] Out: -   General appearance: alert, cooperative and no distress Heart: regular rate and rhythm Lungs: clear to auscultation bilaterally Abdomen: soft, non-tender; bowel sounds normal; no masses,  no organomegaly Wound: clean and dry  Lab Results:  Recent Labs  02/02/14 0304  WBC 17.2*  HGB 8.3*  HCT 23.4*  PLT 191   BMET:  Recent Labs  02/02/14 0304  NA 136*  K 4.1  CL 100  CO2 24  GLUCOSE 104*  BUN 18  CREATININE 1.29*  CALCIUM 8.2*    PT/INR: No results found for this basename: LABPROT, INR,  in the last 72 hours ABG    Component Value Date/Time   PHART 7.379 01/30/2014 0501   HCO3 25.7* 01/30/2014 0501   TCO2 27 01/30/2014 0501   O2SAT 98.0 01/30/2014 0501   CBG (last 3)  No results found for this basename: GLUCAP,  in the last 72 hours  Assessment/Plan: S/P Procedure(s) (LRB): VIDEO ASSISTED THORACOSCOPY (VATS)/BASILAR SEGMENTECTOMY  LEFT LUNG LOWER LOBE WITH FROZEN SECTION, LEFT LOWER LOBECTOMY (Left) CRYO  INTERCOSTAL NERVE BLOCK (N/A)  1. CV- H/O HTN, maintaining NSR- continue home medications 2. Pulm- off oxygen, + atelectasis on CXR remains unchanged, good use of IS/Flutter valve 3. GU- urinary retention resolved 4. ID- Urine dipstick is positive for gross bacturia, nitrate was negative, culture is pending, will give 5 day course of Cipro 5. Dispo- patient stable, will treat for possible UTI, culture remains pending, atelectasis stable, will continue IS/Flutter valve at discharge   LOS: 5 days    Amy Dunn 02/03/2014

## 2014-02-03 NOTE — Progress Notes (Signed)
Discharge education, medication, prescriptions, and follow-up appts reviewed with pt and pts daughter, both stated understanding and that they had no questions Rickard Rhymes, RN

## 2014-02-05 LAB — TYPE AND SCREEN
ABO/RH(D): O POS
Antibody Screen: NEGATIVE
Unit division: 0
Unit division: 0

## 2014-02-05 LAB — URINE CULTURE
Colony Count: 50000
Special Requests: NORMAL

## 2014-02-06 ENCOUNTER — Ambulatory Visit (INDEPENDENT_AMBULATORY_CARE_PROVIDER_SITE_OTHER): Payer: Self-pay | Admitting: Physician Assistant

## 2014-02-06 ENCOUNTER — Ambulatory Visit
Admission: RE | Admit: 2014-02-06 | Discharge: 2014-02-06 | Disposition: A | Discharge status: Home / Self Care | Payer: Medicare Other | Source: Ambulatory Visit | Attending: Thoracic Surgery (Cardiothoracic Vascular Surgery) | Admitting: Thoracic Surgery (Cardiothoracic Vascular Surgery)

## 2014-02-06 ENCOUNTER — Other Ambulatory Visit: Payer: Self-pay | Admitting: *Deleted

## 2014-02-06 ENCOUNTER — Encounter: Payer: Self-pay | Admitting: Physician Assistant

## 2014-02-06 VITALS — BP 116/71 | HR 95 | Resp 16 | Ht 65.0 in | Wt 131.0 lb

## 2014-02-06 DIAGNOSIS — R911 Solitary pulmonary nodule: Secondary | ICD-10-CM

## 2014-02-06 DIAGNOSIS — C3430 Malignant neoplasm of lower lobe, unspecified bronchus or lung: Secondary | ICD-10-CM

## 2014-02-06 DIAGNOSIS — Z902 Acquired absence of lung [part of]: Secondary | ICD-10-CM

## 2014-02-06 DIAGNOSIS — J90 Pleural effusion, not elsewhere classified: Secondary | ICD-10-CM

## 2014-02-06 DIAGNOSIS — Z9889 Other specified postprocedural states: Secondary | ICD-10-CM

## 2014-02-06 NOTE — Progress Notes (Signed)
HPI:  Patient returns for routine postoperative follow-up having undergone a left VATS, limited left thoracotomy, left lower lobe wedge resection, left lower lobe segmentectomy, lymph node dissection, and cryoanalgesia of intercostal nerves by Dr. Roxan Hockey on 01/29/2014. She developed a fever post operative day three to 101. She was found to have a UTI. Klebsiella Pneumonia was cultured and she was placed on Levaquin. Since hospital discharge the patient reports she has had occasional low grade fever (99) and that she has been taking Tylenol. Her other complaints include occasional pain under left breast, fatigue, and decreased appetite. She denies dysphagia,abdominal pain, nausea, emesis, shortness of breath.    Current Outpatient Prescriptions  Medication Sig Dispense Refill  . atenolol (TENORMIN) 50 MG tablet Take 50 mg by mouth daily.      . dorzolamide-timolol (COSOPT) 22.3-6.8 MG/ML ophthalmic solution Place 1 drop into the left eye daily.       . fexofenadine (ALLEGRA) 180 MG tablet Take 180 mg by mouth daily as needed. For allergies      . fluticasone (FLONASE) 50 MCG/ACT nasal spray Place 2 sprays into both nostrils daily as needed for allergies or rhinitis.      Marland Kitchen levofloxacin (LEVAQUIN) 500 MG tablet Take 1 tablet (500 mg total) by mouth daily. For 7 Days  7 tablet  0  . losartan-hydrochlorothiazide (HYZAAR) 100-25 MG per tablet take 1 tablet by mouth once daily  90 tablet  3  . Omeprazole-Sodium Bicarbonate (ZEGERID OTC) 20-1100 MG CAPS capsule Take 1 capsule by mouth daily before breakfast.      . oxyCODONE (OXY IR/ROXICODONE) 5 MG immediate release tablet Take 1-2 tablets (5-10 mg total) by mouth every 4 (four) hours as needed for moderate pain or severe pain.  30 tablet  0  . promethazine-codeine (PHENERGAN WITH CODEINE) 6.25-10 MG/5ML syrup Take 5 mLs by mouth every 6 (six) hours as needed for cough.       . tamsulosin (FLOMAX) 0.4 MG CAPS capsule Take 1 capsule (0.4 mg total) by  mouth daily.  30 capsule  1  . traMADol (ULTRAM) 50 MG tablet Take 1 tablet (50 mg total) by mouth every 6 (six) hours as needed for moderate pain.  100 tablet  2   No current facility-administered medications for this visit.    Physical Exam: CV: RRR Pulmonary: Slightly diminished at left base. Right lung is clear. Abdomen: Soft, non tender, bowel sounds present. Extremities: No lower extremity edema Wounds: Clean, dry, and no signs of infection. 4 nylon chest tube sutures removed without problems.  Diagnostic Tests: CLINICAL DATA: Left VATS on 01/29/2014 with segment ectopy of the eft lower lobe, followup, some weakness:EXAM:CHEST 2 VIEW COMPARISON: Chest x-ray of 09/05/2015FINDINGS:There is slight decrease in volume of the left hydro pneumothorax. A tiny right pleural effusion remains. The lungs appear somewhat better aerated. Cardiomegaly is stable.IMPRESSION:Slight decrease in size of left hydro pneumothorax. Small right effusion. Electronically Signed By: Windy Canny.D.On: 02/06/2014 11:53  Impression and Plan: Overall, Amy Dunn is continuing steady progress of recovering from left lung surgery. She was instructed to finish the Levaquin. She currently, has no GU complaints.If she develops any itching, burning, fever, or chills she is to call the office as soon as possible. The pain under her left breast is related to her surgery. Again, this should lessen in time.She is still taking narcotics for pain. She was instructed she may not drive until instructed otherwise. Her daughter asked that we arrange home health and PT. Regarding her  decreased appetite, I encouraged her to to try protein shakes or powder. Her appetite should improve with time. She was instructed to continue with her incentive spirometer.She will return to see Dr. Roxan Hockey in a few weeks.

## 2014-02-12 ENCOUNTER — Other Ambulatory Visit: Payer: Self-pay | Admitting: Thoracic Surgery (Cardiothoracic Vascular Surgery)

## 2014-02-12 DIAGNOSIS — C3490 Malignant neoplasm of unspecified part of unspecified bronchus or lung: Secondary | ICD-10-CM

## 2014-02-13 ENCOUNTER — Ambulatory Visit (INDEPENDENT_AMBULATORY_CARE_PROVIDER_SITE_OTHER): Payer: Self-pay | Admitting: Thoracic Surgery (Cardiothoracic Vascular Surgery)

## 2014-02-13 ENCOUNTER — Ambulatory Visit
Admission: RE | Admit: 2014-02-13 | Discharge: 2014-02-13 | Disposition: A | Discharge status: Home / Self Care | Payer: Medicare Other | Source: Ambulatory Visit | Attending: Thoracic Surgery (Cardiothoracic Vascular Surgery) | Admitting: Thoracic Surgery (Cardiothoracic Vascular Surgery)

## 2014-02-13 ENCOUNTER — Encounter: Payer: Self-pay | Admitting: Thoracic Surgery (Cardiothoracic Vascular Surgery)

## 2014-02-13 VITALS — BP 104/68 | HR 81 | Ht 65.0 in | Wt 131.0 lb

## 2014-02-13 DIAGNOSIS — C3490 Malignant neoplasm of unspecified part of unspecified bronchus or lung: Secondary | ICD-10-CM

## 2014-02-13 DIAGNOSIS — C3430 Malignant neoplasm of lower lobe, unspecified bronchus or lung: Secondary | ICD-10-CM

## 2014-02-13 NOTE — Progress Notes (Signed)
HPI:  Mrs. Delair returns for a scheduled postoperative followup visit.  She is a 73 year old woman who had a left lower lobe basilar segmentectomy on August 31 for what turned out to be a stage IA well-differentiated adenocarcinoma. Her postoperative course was complicated by a Klebsiella urinary tract infection which treated with ciprofloxacin. It was otherwise unremarkable and she was discharged home on postoperative day #5.  She says that she is still sore. She is not having to take the pain medication very often. She took a dose at 8 PM on Sunday and then another dose at 8 AM today. She did not have to use it at all yesterday.  She has not had any problems with shortness of breath or wheezing. She does complain that she has a poor appetite and that food doesn't taste right. She also feels tired.  Past Medical History  Diagnosis Date  . History of colonic polyps   . Acute peptic ulcer, unspecified site, with hemorrhage and perforation, without mention of obstruction   . Osteoarthritis of left shoulder   . Primary osteoarthritis of left hip   . Pulmonary nodule, left     Left Lower lobe  . GERD (gastroesophageal reflux disease)   . Cough   . Hypertension   . TIA (transient ischemic attack) 2011    transient blindness, speech- resolved  . Stroke     no residual  . Glaucoma     right eye     Current Outpatient Prescriptions  Medication Sig Dispense Refill  . atenolol (TENORMIN) 50 MG tablet Take 50 mg by mouth daily.      . dorzolamide-timolol (COSOPT) 22.3-6.8 MG/ML ophthalmic solution Place 1 drop into the left eye daily.       . fexofenadine (ALLEGRA) 180 MG tablet Take 180 mg by mouth daily as needed. For allergies      . fluticasone (FLONASE) 50 MCG/ACT nasal spray Place 2 sprays into both nostrils daily as needed for allergies or rhinitis.      Marland Kitchen levofloxacin (LEVAQUIN) 500 MG tablet Take 1 tablet (500 mg total) by mouth daily. For 7 Days  7 tablet  0  .  losartan-hydrochlorothiazide (HYZAAR) 100-25 MG per tablet TAKE 1/2 TABLET DAILY      . Omeprazole-Sodium Bicarbonate (ZEGERID OTC) 20-1100 MG CAPS capsule Take 1 capsule by mouth daily before breakfast.      . tamsulosin (FLOMAX) 0.4 MG CAPS capsule Take 1 capsule (0.4 mg total) by mouth daily.  30 capsule  1  . traMADol (ULTRAM) 50 MG tablet Take 1 tablet (50 mg total) by mouth every 6 (six) hours as needed for moderate pain.  100 tablet  2  . oxyCODONE (OXY IR/ROXICODONE) 5 MG immediate release tablet Take 1-2 tablets (5-10 mg total) by mouth every 4 (four) hours as needed for moderate pain or severe pain.  30 tablet  0  . promethazine-codeine (PHENERGAN WITH CODEINE) 6.25-10 MG/5ML syrup Take 5 mLs by mouth every 6 (six) hours as needed for cough.        No current facility-administered medications for this visit.    Physical Exam BP 104/68  Pulse 81  Ht 5\' 5"  (1.651 m)  Wt 131 lb (59.421 kg)  BMI 21.80 kg/m2  SpO69 40% 73 year old woman in no acute distress Well-developed well-nourished Alert oriented x3 with no focal deficits Cardiac regular rate and rhythm normal S1-S2 Lungs diminished breath sounds at left base, otherwise clear Incisions healing well No peripheral edema  Diagnostic Tests: CHEST  2 VIEW  COMPARISON: Multiple prior chest x-rays, most recent 02/06/2014.  FINDINGS:  Cardiomediastinal silhouette unchanged in size and contour,  partially obscured on the left heart border by overlying lung and  pleural disease.  Surgical changes in the left hilum are again evident.  Right lung remains well aerated, with interval resolution of  costophrenic/cardiophrenic opacities.  Left-sided pneumothorax appears to have resolved.  Persistent left basilar opacity obscuring the left hemidiaphragm.  Lateral view demonstrates improved appearance of the meniscus.  Persistent mixed interstitial and airspace opacity involving the  lower half of the left lung.  Surgical clips within  the left chest wall.  No displaced acute fracture.  Unremarkable appearance of the upper abdomen.  IMPRESSION:  Improved appearance of the chest x-ray status post VATS procedure,  with resolved left pneumothorax and improving bilateral pleural  effusions. There is, however, persisting interstitial and airspace  disease on the left, likely representing a combination of residual  pleural fluid, atelectasis, and/consolidation.  Signed,  Dulcy Fanny. Earleen Newport DO  [facility]  [facility]   Impression: 73 year old woman who is now 2 weeks out from a left lower lobe basilar segmentectomy for stage IA non-small cell carcinoma. Overall she's doing well at this point in time. She did have a Klebsiella urinary tract infection and was treated with ciprofloxacin. She has no symptoms related to that. She does have some incisional discomfort but is only using narcotics on sporadically. Her appetite is poor, which is typical as soon after the surgery.  I advised her to wait about another week before attempting to drive and then discussed appropriate precautions. Her activities are otherwise unrestricted but she was cautioned to build into new activities gradually.  I will plan to see her back in 2 weeks to check on her progress. We'll do another chest x-ray at that time. She does still have some atelectasis in the lingula.  We will plan to send her to [place] at 3 months so that she can become familiar with the resources that are available bare.  Plan:  Return in 2 weeks with PA and lateral chest x-ray

## 2014-02-26 ENCOUNTER — Other Ambulatory Visit: Payer: Self-pay | Admitting: Thoracic Surgery (Cardiothoracic Vascular Surgery)

## 2014-02-26 DIAGNOSIS — C3490 Malignant neoplasm of unspecified part of unspecified bronchus or lung: Secondary | ICD-10-CM

## 2014-02-27 ENCOUNTER — Ambulatory Visit (INDEPENDENT_AMBULATORY_CARE_PROVIDER_SITE_OTHER): Payer: Self-pay | Admitting: Thoracic Surgery (Cardiothoracic Vascular Surgery)

## 2014-02-27 ENCOUNTER — Ambulatory Visit: Payer: Medicare Other | Admitting: Thoracic Surgery (Cardiothoracic Vascular Surgery)

## 2014-02-27 ENCOUNTER — Encounter: Payer: Self-pay | Admitting: Thoracic Surgery (Cardiothoracic Vascular Surgery)

## 2014-02-27 ENCOUNTER — Ambulatory Visit
Admission: RE | Admit: 2014-02-27 | Discharge: 2014-02-27 | Disposition: A | Discharge status: Home / Self Care | Payer: Medicare Other | Source: Ambulatory Visit | Attending: Thoracic Surgery (Cardiothoracic Vascular Surgery) | Admitting: Thoracic Surgery (Cardiothoracic Vascular Surgery)

## 2014-02-27 VITALS — BP 103/66 | HR 76 | Ht 65.0 in | Wt 131.0 lb

## 2014-02-27 DIAGNOSIS — C3430 Malignant neoplasm of lower lobe, unspecified bronchus or lung: Secondary | ICD-10-CM

## 2014-02-27 DIAGNOSIS — C3490 Malignant neoplasm of unspecified part of unspecified bronchus or lung: Secondary | ICD-10-CM

## 2014-02-27 MED ORDER — OXYCODONE HCL 5 MG PO TABS: 5.0000 mg | ORAL_TABLET | Freq: Four times a day (QID) | ORAL | Status: DC | PRN

## 2014-02-27 NOTE — Progress Notes (Signed)
HPI:  Mrs. Kleiman returns for a scheduled postoperative followup visit.   She is a 73 year old woman who had a left lower lobe basilar segmentectomy on August 31 for what turned out to be a stage IA well-differentiated adenocarcinoma. Her postoperative course was complicated by a Klebsiella urinary tract infection which treated with ciprofloxacin. It was otherwise unremarkable and she was discharged home on postoperative day #5.   She does still have some incisional pain. She has been waking up most nights between 2 and 3 in the morning and having to take a pain pill go back to sleep. She has not tried taking a pain pill before she goes to sleep. When she was here 2 weeks ago she was complaining of a poor appetite and could not tasting right. That has improved but not returned to normal. Her daughter says that she seems tired all the time and has not been pushing herself to walk.  Past Medical History  Diagnosis Date  . History of colonic polyps   . Acute peptic ulcer, unspecified site, with hemorrhage and perforation, without mention of obstruction   . Osteoarthritis of left shoulder   . Primary osteoarthritis of left hip   . Pulmonary nodule, left     Left Lower lobe  . GERD (gastroesophageal reflux disease)   . Cough   . Hypertension   . TIA (transient ischemic attack) 2011    transient blindness, speech- resolved  . Stroke     no residual  . Glaucoma     right eye      Current Outpatient Prescriptions  Medication Sig Dispense Refill  . atenolol (TENORMIN) 50 MG tablet Take 50 mg by mouth daily.      . dorzolamide-timolol (COSOPT) 22.3-6.8 MG/ML ophthalmic solution Place 1 drop into the left eye daily.       . fexofenadine (ALLEGRA) 180 MG tablet Take 180 mg by mouth daily as needed. For allergies      . fluticasone (FLONASE) 50 MCG/ACT nasal spray Place 2 sprays into both nostrils daily as needed for allergies or rhinitis.      Marland Kitchen losartan-hydrochlorothiazide (HYZAAR) 100-25 MG  per tablet TAKE 1/2 TABLET DAILY      . Omeprazole-Sodium Bicarbonate (ZEGERID OTC) 20-1100 MG CAPS capsule Take 1 capsule by mouth daily before breakfast.      . tamsulosin (FLOMAX) 0.4 MG CAPS capsule Take 1 capsule (0.4 mg total) by mouth daily.  30 capsule  1  . traMADol (ULTRAM) 50 MG tablet Take 1 tablet (50 mg total) by mouth every 6 (six) hours as needed for moderate pain.  100 tablet  2  . oxyCODONE (OXY IR/ROXICODONE) 5 MG immediate release tablet Take 1-2 tablets (5-10 mg total) by mouth every 6 (six) hours as needed for moderate pain or severe pain.  30 tablet  0  . promethazine-codeine (PHENERGAN WITH CODEINE) 6.25-10 MG/5ML syrup Take 5 mLs by mouth every 6 (six) hours as needed for cough.        No current facility-administered medications for this visit.    Physical Exam BP 103/66  Pulse 76  Ht 5\' 5"  (1.651 m)  Wt 131 lb (59.421 kg)  BMI 21.80 kg/m2  SpO74 46% 73 year old woman in no acute distress Alert and oriented x3 Lungs diminished at left base, otherwise clear Cardiac regular rate and rhythm Incisions healing well  Diagnostic Tests: CHEST 2 VIEW  COMPARISON: February 13, 2014  FINDINGS:  There is postoperative change on the left. There is less  consolidation the left lower lobe compared to recent prior study.  There is pleural thickening with mild loculated effusion left base.  Right lung is mildly hyperexpanded but clear. Heart size is within  normal limits. Pulmonary vascularity is within normal limits given  the postoperative change on the left. No apparent adenopathy. No  blastic or lytic bone lesions.  IMPRESSION:  Postoperative change on the left with partial but incomplete  clearing of consolidation from the left lower lobe. Pleural  thickening with probable loculated effusion left base. Right lung  hyperexpanded but clear. No new opacity. No change in cardiac  silhouette.  Electronically Signed  By: Lowella Grip M.D.  On: 02/27/2014  09:37   Impression: Mrs. Andre is a 73 year old woman who had a thoracoscopic left basilar segmentectomy for a stage IA well-differentiated adenocarcinoma. She is doing well at this time.  I advised her to take a pain pill before voiding to bed 6 to keep her from waking up in the middle of the night. That might help her tiredness.  I gave her a prescription for an additional 30 tablets of oxycodone 5 mg, one by mouth every 6 when necessary  I encouraged her to increase her physical activities, particularly walking.  I discussed with her seeing Dr. Inda Merlin and Norton Blizzard in the cancer center. She does not need adjuvant chemotherapy that might benefit from some of the other ancillary services that are available through the cancer center. We will set that appointment up in the next month or 2  Plan: I will plan to see her back in 5 months for her 6 month followup visit.  We will do a CT of the chest at that time

## 2014-02-28 ENCOUNTER — Telehealth: Payer: Self-pay | Admitting: *Deleted

## 2014-03-20 ENCOUNTER — Other Ambulatory Visit: Payer: Self-pay | Admitting: Internal Medicine

## 2014-03-20 DIAGNOSIS — Z1231 Encounter for screening mammogram for malignant neoplasm of breast: Secondary | ICD-10-CM

## 2014-03-21 ENCOUNTER — Telehealth: Payer: Self-pay | Admitting: *Deleted

## 2014-03-21 NOTE — Telephone Encounter (Signed)
Called set up with thoracic clinic on 03/29/14 at 12:30 labs and Dr. Julien Nordmann.  She verbalized understanding of appt time and place.

## 2014-03-28 ENCOUNTER — Other Ambulatory Visit: Payer: Self-pay | Admitting: Medical Oncology

## 2014-03-28 DIAGNOSIS — C3490 Malignant neoplasm of unspecified part of unspecified bronchus or lung: Secondary | ICD-10-CM

## 2014-03-29 ENCOUNTER — Ambulatory Visit (HOSPITAL_BASED_OUTPATIENT_CLINIC_OR_DEPARTMENT_OTHER): Payer: Medicare Other | Admitting: Internal Medicine

## 2014-03-29 ENCOUNTER — Encounter: Payer: Self-pay | Admitting: Internal Medicine

## 2014-03-29 ENCOUNTER — Ambulatory Visit: Payer: Medicare Other | Attending: Internal Medicine | Admitting: Physical Therapy

## 2014-03-29 ENCOUNTER — Telehealth: Payer: Self-pay | Admitting: Internal Medicine

## 2014-03-29 ENCOUNTER — Other Ambulatory Visit (HOSPITAL_BASED_OUTPATIENT_CLINIC_OR_DEPARTMENT_OTHER): Payer: Medicare Other

## 2014-03-29 ENCOUNTER — Encounter: Payer: Self-pay | Admitting: *Deleted

## 2014-03-29 VITALS — BP 159/91 | HR 64 | Temp 98.1°F | Resp 18 | Ht 65.0 in | Wt 118.2 lb

## 2014-03-29 DIAGNOSIS — M25619 Stiffness of unspecified shoulder, not elsewhere classified: Secondary | ICD-10-CM | POA: Diagnosis not present

## 2014-03-29 DIAGNOSIS — M25519 Pain in unspecified shoulder: Secondary | ICD-10-CM | POA: Insufficient documentation

## 2014-03-29 DIAGNOSIS — C3490 Malignant neoplasm of unspecified part of unspecified bronchus or lung: Secondary | ICD-10-CM

## 2014-03-29 DIAGNOSIS — C3492 Malignant neoplasm of unspecified part of left bronchus or lung: Secondary | ICD-10-CM

## 2014-03-29 LAB — COMPREHENSIVE METABOLIC PANEL (CC13)
ALBUMIN: 3.5 g/dL (ref 3.5–5.0)
ALT: 6 U/L (ref 0–55)
AST: 12 U/L (ref 5–34)
Alkaline Phosphatase: 80 U/L (ref 40–150)
Anion Gap: 9 mEq/L (ref 3–11)
BUN: 11.7 mg/dL (ref 7.0–26.0)
CHLORIDE: 101 meq/L (ref 98–109)
CO2: 28 mEq/L (ref 22–29)
Calcium: 9.4 mg/dL (ref 8.4–10.4)
Creatinine: 1.2 mg/dL — ABNORMAL HIGH (ref 0.6–1.1)
Glucose: 106 mg/dl (ref 70–140)
POTASSIUM: 3.6 meq/L (ref 3.5–5.1)
Sodium: 138 mEq/L (ref 136–145)
Total Bilirubin: 0.49 mg/dL (ref 0.20–1.20)
Total Protein: 7.2 g/dL (ref 6.4–8.3)

## 2014-03-29 LAB — CBC WITH DIFFERENTIAL/PLATELET
BASO%: 1.2 % (ref 0.0–2.0)
BASOS ABS: 0.1 10*3/uL (ref 0.0–0.1)
EOS%: 1.8 % (ref 0.0–7.0)
Eosinophils Absolute: 0.1 10*3/uL (ref 0.0–0.5)
HEMATOCRIT: 31.1 % — AB (ref 34.8–46.6)
HEMOGLOBIN: 10.3 g/dL — AB (ref 11.6–15.9)
LYMPH#: 2.2 10*3/uL (ref 0.9–3.3)
LYMPH%: 27.1 % (ref 14.0–49.7)
MCH: 25.8 pg (ref 25.1–34.0)
MCHC: 33 g/dL (ref 31.5–36.0)
MCV: 78.2 fL — ABNORMAL LOW (ref 79.5–101.0)
MONO#: 0.9 10*3/uL (ref 0.1–0.9)
MONO%: 11 % (ref 0.0–14.0)
NEUT%: 58.9 % (ref 38.4–76.8)
NEUTROS ABS: 4.7 10*3/uL (ref 1.5–6.5)
Platelets: 211 10*3/uL (ref 145–400)
RBC: 3.98 10*6/uL (ref 3.70–5.45)
RDW: 15.2 % — ABNORMAL HIGH (ref 11.2–14.5)
WBC: 8 10*3/uL (ref 3.9–10.3)

## 2014-03-29 NOTE — Progress Notes (Signed)
[place] Clinical Social Work  Clinical Social Work met with patient and Futures trader at [employer] appointment to offer support and assess for psychosocial needs.  Medical oncologist reviewed patient's diagnosis and recommended plan for observation with patient.  Amy Dunn is married, has two children(one lives local and one lives in [place]) and three grandchildren.  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  She has no concerns at this time.      Clinical Social Work briefly discussed Clinical Social Work role and [employer] support programs/services.  Clinical Social Work encouraged patient to call with any additional questions or concerns.   Polo Riley, MSW, LCSW, OSW-C Clinical Social Worker [facility] [phone]

## 2014-03-29 NOTE — Telephone Encounter (Signed)
Pt confirmed labs/ov per 10/29 POF, gave pt AVS...... KJ °

## 2014-03-29 NOTE — Progress Notes (Signed)
[place] Telephone:([phone]   [phone]  CONSULT NOTE  REFERRING PHYSICIAN: Dr. Erasmo Leventhal  REASON FOR CONSULTATION:  73 years old African-American female recently diagnosed with lung cancer.  HPI Amy Dunn is a 73 y.o. female with past medical history significant for hypertension, GERD, history of peptic ulcer disease, osteoarthritis, TIA and meningioma as well as short history of smoking but quit 30 years ago. The patient has been complaining of chronic cough for several years and she was seen by Dr. Elsworth Soho for evaluation. She had several imaging studies including CT scan of the chest since February 2012 which showed ill-defined groundglass opacity in the left lower lobe. This was followed by observation and close monitoring with repeat scan at regular basis. CT scan of the chest on 03/29/2013 showed 1.4 x 1.1 cm sub-solid pulmonary nodule in the left lower lobe with associated 4 mm linear solid component. There was also a faint 4 mm right upper lobe pulmonary nodule. PET scan performed on 11/29/2013 showed progressively enlarging subsided left lower lobe nodule was low level FDG activity and the findings are most consistent with low-grade adenocarcinoma. On 01/29/2014 the patient underwent left VATS with left lower lobe wedge resection as well as left basilar segmentectomy and lymph node dissection under the care of Dr. Roxan Hockey. The final pathology (Accession: [id number]) showed well-differentiated adenocarcinoma spanning 1.2 cm. The final resection margin was negative for tumor and the dissected lymph nodes were also negative for tumor. The patient is recovering well from her recent surgery and she is here today for evaluation and discussion of her treatment options. When seen today she continues to have some soreness in the left lower rib cage after the surgical resection. She is currently on tramadol and occasional oxycodone. She denied having  any significant shortness of breath but has mild cough productive of whitish sputum and no hemoptysis. She lost around 12 pounds during her surgery time. She denied having any significant fever or chills, no nausea or vomiting. The patient denied having any significant headache or blurry vision. Family history significant for mother with heart disease and hypertension. Father had colon cancer. The patient is married and has 2 children. She works in Firefighter. She has a remote history of smoking for around city's but quit 30 years ago. She has no history of alcohol or drug abuse.  HPI  Past Medical History  Diagnosis Date  . History of colonic polyps   . Acute peptic ulcer, unspecified site, with hemorrhage and perforation, without mention of obstruction   . Osteoarthritis of left shoulder   . Primary osteoarthritis of left hip   . Pulmonary nodule, left     Left Lower lobe  . GERD (gastroesophageal reflux disease)   . Cough   . Hypertension   . TIA (transient ischemic attack) 2011    transient blindness, speech- resolved  . Stroke     no residual  . Glaucoma     right eye    Past Surgical History  Procedure Laterality Date  . Colon surgery    . Surgical repair of peptic ulcer    . Tubal ligation    . Eye surgery Bilateral     cataracts  . Video assisted thoracoscopy (vats)/wedge resection Left 01/29/2014    Procedure: VIDEO ASSISTED THORACOSCOPY (VATS)/BASILAR SEGMENTECTOMY  LEFT LUNG LOWER LOBE WITH FROZEN SECTION, LEFT LOWER LOBECTOMY;  Surgeon: Melrose Nakayama, MD;  Location: [place];  Service: Thoracic;  Laterality: Left;  Family History  Problem Relation Age of Onset  . Hypertension Mother   . Heart disease Mother   . Heart attack Mother   . Cancer Father     Colon cancer    Social History History  Substance Use Topics  . Smoking status: Former Smoker -- 1 years    Types: Cigarettes    Quit date: 05/26/1981  . Smokeless tobacco: Never Used      Comment: 2 ciggs per day  . Alcohol Use: No    Allergies  Allergen Reactions  . Amlodipine Other (See Comments)    Trouble swallowing  . Penicillins Itching    Current Outpatient Prescriptions  Medication Sig Dispense Refill  . atenolol (TENORMIN) 50 MG tablet Take 50 mg by mouth daily.      . dorzolamide-timolol (COSOPT) 22.3-6.8 MG/ML ophthalmic solution Place 1 drop into the left eye daily.       . fluticasone (FLONASE) 50 MCG/ACT nasal spray Place 2 sprays into both nostrils daily as needed for allergies or rhinitis.      Marland Kitchen losartan-hydrochlorothiazide (HYZAAR) 100-25 MG per tablet TAKE 1/2 TABLET DAILY      . Omeprazole-Sodium Bicarbonate (ZEGERID OTC) 20-1100 MG CAPS capsule Take 1 capsule by mouth daily before breakfast.      . oxyCODONE (OXY IR/ROXICODONE) 5 MG immediate release tablet Take 1-2 tablets (5-10 mg total) by mouth every 6 (six) hours as needed for moderate pain or severe pain.  30 tablet  0  . traMADol (ULTRAM) 50 MG tablet Take 1 tablet (50 mg total) by mouth every 6 (six) hours as needed for moderate pain.  100 tablet  2  . fexofenadine (ALLEGRA) 180 MG tablet Take 180 mg by mouth daily as needed. For allergies      . promethazine-codeine (PHENERGAN WITH CODEINE) 6.25-10 MG/5ML syrup Take 5 mLs by mouth every 6 (six) hours as needed for cough.        No current facility-administered medications for this visit.    Review of Systems  Constitutional: negative Eyes: negative Ears, nose, mouth, throat, and face: negative Respiratory: positive for cough, pleurisy/chest pain and sputum Cardiovascular: negative Gastrointestinal: negative Genitourinary:negative Integument/breast: negative Hematologic/lymphatic: negative Musculoskeletal:negative Neurological: negative Behavioral/Psych: negative Endocrine: negative Allergic/Immunologic: negative  Physical Exam  XBM:WUXLK, healthy, no distress, well nourished and well developed SKIN: skin color, texture,  turgor are normal, no rashes or significant lesions HEAD: Normocephalic, No masses, lesions, tenderness or abnormalities EYES: normal, PERRLA EARS: External ears normal, Canals clear OROPHARYNX:no exudate, no erythema and lips, buccal mucosa, and tongue normal  NECK: supple, no adenopathy, no JVD LYMPH:  no palpable lymphadenopathy, no hepatosplenomegaly BREAST:not examined LUNGS: clear to auscultation , and palpation HEART: regular rate & rhythm, no murmurs and no gallops ABDOMEN:abdomen soft, non-tender, normal bowel sounds and no masses or organomegaly BACK: Back symmetric, no curvature., No CVA tenderness EXTREMITIES:no joint deformities, effusion, or inflammation, no edema, no skin discoloration  NEURO: alert & oriented x 3 with fluent speech, no focal motor/sensory deficits  PERFORMANCE STATUS: ECOG 1  LABORATORY DATA: Lab Results  Component Value Date   WBC 8.0 03/29/2014   HGB 10.3* 03/29/2014   HCT 31.1* 03/29/2014   MCV 78.2* 03/29/2014   PLT 211 03/29/2014      Chemistry      Component Value Date/Time   NA 138 03/29/2014 1236   NA 136* 02/02/2014 0304   K 3.6 03/29/2014 1236   K 4.1 02/02/2014 0304   CL 100 02/02/2014 0304  CO2 28 03/29/2014 1236   CO2 24 02/02/2014 0304   BUN 11.7 03/29/2014 1236   BUN 18 02/02/2014 0304   CREATININE 1.2* 03/29/2014 1236   CREATININE 1.29* 02/02/2014 0304      Component Value Date/Time   CALCIUM 9.4 03/29/2014 1236   CALCIUM 8.2* 02/02/2014 0304   ALKPHOS 80 03/29/2014 1236   ALKPHOS 91 01/31/2014 0400   AST 12 03/29/2014 1236   AST 27 01/31/2014 0400   ALT 6 03/29/2014 1236   ALT 16 01/31/2014 0400   BILITOT 0.49 03/29/2014 1236   BILITOT 0.3 01/31/2014 0400       RADIOGRAPHIC STUDIES: No results found.  ASSESSMENT: This is a very pleasant 73 years old African-American female recently diagnosed with a stage IA (T18, N0, M0) non-small cell lung cancer, well-differentiated adenocarcinoma measuring 1.2 cm in size status post left  lower lobe wedge resection with lymph node dissection. This was performed on 01/29/2014.   PLAN: I had a lengthy discussion with the patient today about her current disease is stage, prognosis and treatment options. I explained to the patient that there is no survival benefit for adjuvant chemotherapy for patient with a stage IA non-small cell lung cancer. I also explained to the patient that the standard of care is just observation and close monitoring. I will arrange for the patient to have repeat CT scan of the chest performed in 6 months for restaging of her disease. She was advised to call immediately if she has any concerning symptoms in the interval. For pain management she will continue on tramadol as well as oxycodone for the soreness on the left side of her chest. She was advised to call immediately if she has any concerning symptoms in the interval. The patient was seen during the multidisciplinary thoracic oncology clinic today by medical oncology, thoracic navigator and social worker as well as physical therapist. The patient voices understanding of current disease status and treatment options and is in agreement with the current care plan.  All questions were answered. The patient knows to call the clinic with any problems, questions or concerns. We can certainly see the patient much sooner if necessary.  Thank you so much for allowing me to participate in the care of Amy Dunn. I will continue to follow up the patient with you and assist in her care.  I spent 40 minutes counseling the patient face to face. The total time spent in the appointment was 60 minutes.  Disclaimer: This note was dictated with voice recognition software. Similar sounding words can inadvertently be transcribed and may not be corrected upon review.   Amy Dunn K. 03/29/2014, 1:53 PM

## 2014-04-06 ENCOUNTER — Ambulatory Visit: Payer: Medicare Other

## 2014-04-17 ENCOUNTER — Ambulatory Visit (INDEPENDENT_AMBULATORY_CARE_PROVIDER_SITE_OTHER): Payer: Medicare Other | Admitting: Ophthalmology

## 2014-04-25 ENCOUNTER — Ambulatory Visit (INDEPENDENT_AMBULATORY_CARE_PROVIDER_SITE_OTHER): Payer: Medicare Other | Admitting: Ophthalmology

## 2014-04-25 DIAGNOSIS — H34831 Tributary (branch) retinal vein occlusion, right eye: Secondary | ICD-10-CM

## 2014-04-25 DIAGNOSIS — H35033 Hypertensive retinopathy, bilateral: Secondary | ICD-10-CM

## 2014-04-25 DIAGNOSIS — H43813 Vitreous degeneration, bilateral: Secondary | ICD-10-CM

## 2014-04-25 DIAGNOSIS — I10 Essential (primary) hypertension: Secondary | ICD-10-CM

## 2014-05-02 ENCOUNTER — Telehealth: Payer: Self-pay | Admitting: *Deleted

## 2014-05-02 NOTE — Telephone Encounter (Signed)
Called patient to check up. I asked how she was feeling.  She stated she was a little sore at incision.  I asked if it was red or draining.  She stated no.  We discussed how soreness is normal to feel after lung surgery.  I did asked that she call Dr. Leonarda Salon office if the soreness got worse.  Patient was in good spirits and was thankful for the call.

## 2014-05-04 ENCOUNTER — Ambulatory Visit: Payer: Medicare Other

## 2014-06-25 ENCOUNTER — Ambulatory Visit: Payer: Medicare Other

## 2014-06-26 ENCOUNTER — Other Ambulatory Visit: Payer: Self-pay

## 2014-06-26 MED ORDER — TRAMADOL HCL 50 MG PO TABS: 50.0000 mg | ORAL_TABLET | Freq: Four times a day (QID) | ORAL | Status: DC | PRN

## 2014-06-26 NOTE — Telephone Encounter (Signed)
RX refill for Tramadol faxed to [facility]

## 2014-07-03 ENCOUNTER — Encounter: Payer: Self-pay | Admitting: Pulmonary Disease

## 2014-07-03 ENCOUNTER — Ambulatory Visit (INDEPENDENT_AMBULATORY_CARE_PROVIDER_SITE_OTHER): Payer: Medicare Other | Admitting: Pulmonary Disease

## 2014-07-03 VITALS — BP 90/64 | HR 66 | Ht 65.0 in | Wt 124.8 lb

## 2014-07-03 DIAGNOSIS — R05 Cough: Secondary | ICD-10-CM

## 2014-07-03 DIAGNOSIS — R053 Chronic cough: Secondary | ICD-10-CM

## 2014-07-03 DIAGNOSIS — C3492 Malignant neoplasm of unspecified part of left bronchus or lung: Secondary | ICD-10-CM

## 2014-07-03 MED ORDER — BENZONATATE 200 MG PO CAPS: 200.0000 mg | ORAL_CAPSULE | Freq: Three times a day (TID) | ORAL | Status: DC | PRN

## 2014-07-03 NOTE — Progress Notes (Signed)
   Subjective:    Patient ID: Amy Dunn, female    DOB: [birth year], 74 y.o.   MRN: [id number]  HPI  PCP - [facility]  73/F, ex smoker for FU of lung CA & chronic cough since 07/2008 attributed to upper airway cough/ GERD  She smoked for 25 Pyrs before quitting in 1980.   LES was dilated - CT abdomen 3/13 showed -Air and fluid within dilated distal esophagus s/o  distal esophageal stricture   Significant tests/ events  01/29/2014 -stage IA (T18, N0, M0) non-small cell lung cancer, well-differentiated adenocarcinoma measuring 1.2 cm in size status post left lower lobe wedge resection with lymph node dissection. -Presented with hypermetabolic LLL nodule , first noted on CT feb 2011 ( not seen on CT abd feb 2006 ) -stable x2 y, then slight growth in 2014    07/2009 allergy panel nml.   Spirometry showed flattening of expiratory limb but no evidence of airway obstruction.  PFTs - no airway obstruction -mild restriction, TLC 80%, DLCO 70%    Review of Systems neg for any significant sore throat, dysphagia, itching, sneezing, nasal congestion or excess/ purulent secretions, fever, chills, sweats, unintended wt loss, pleuritic or exertional cp, hempoptysis, orthopnea pnd or change in chronic leg swelling. Also denies presyncope, palpitations, heartburn, abdominal pain, nausea, vomiting, diarrhea or change in bowel or urinary habits, dysuria,hematuria, rash, arthralgias, visual complaints, headache, numbness weakness or ataxia.     Objective:   Physical Exam  Gen. Pleasant, well-nourished, in no distress ENT - no lesions, no post nasal drip Neck: No JVD, no thyromegaly, no carotid bruits Lungs: no use of accessory muscles, no dullness to percussion, clear without rales or rhonchi  Cardiovascular: Rhythm regular, heart sounds  normal, no murmurs or gallops, no peripheral edema Musculoskeletal: No deformities, no cyanosis or clubbing         Assessment & Plan:

## 2014-07-03 NOTE — Patient Instructions (Signed)
Refills on benzonatate 200 tid prn #90 Call as needed

## 2014-07-06 NOTE — Assessment & Plan Note (Signed)
Surveillance scans planned by oncology

## 2014-07-06 NOTE — Assessment & Plan Note (Signed)
Stable Has been attributed to GERD-esophageal stricture was dilated in the past

## 2014-07-24 DIAGNOSIS — I10 Essential (primary) hypertension: Secondary | ICD-10-CM | POA: Diagnosis not present

## 2014-08-07 DIAGNOSIS — I10 Essential (primary) hypertension: Secondary | ICD-10-CM | POA: Diagnosis not present

## 2014-09-03 ENCOUNTER — Other Ambulatory Visit: Payer: Self-pay | Admitting: Thoracic Surgery (Cardiothoracic Vascular Surgery)

## 2014-09-26 ENCOUNTER — Encounter (HOSPITAL_COMMUNITY): Payer: Self-pay

## 2014-09-26 ENCOUNTER — Other Ambulatory Visit (HOSPITAL_BASED_OUTPATIENT_CLINIC_OR_DEPARTMENT_OTHER): Payer: Medicare Other

## 2014-09-26 ENCOUNTER — Ambulatory Visit (HOSPITAL_COMMUNITY)
Admission: RE | Admit: 2014-09-26 | Discharge: 2014-09-26 | Disposition: A | Discharge status: Home / Self Care | Payer: Medicare Other | Source: Ambulatory Visit | Attending: Internal Medicine | Admitting: Internal Medicine

## 2014-09-26 DIAGNOSIS — Z9889 Other specified postprocedural states: Secondary | ICD-10-CM | POA: Diagnosis not present

## 2014-09-26 DIAGNOSIS — Z85118 Personal history of other malignant neoplasm of bronchus and lung: Secondary | ICD-10-CM | POA: Diagnosis not present

## 2014-09-26 DIAGNOSIS — C3492 Malignant neoplasm of unspecified part of left bronchus or lung: Secondary | ICD-10-CM

## 2014-09-26 DIAGNOSIS — Z87891 Personal history of nicotine dependence: Secondary | ICD-10-CM | POA: Insufficient documentation

## 2014-09-26 DIAGNOSIS — J90 Pleural effusion, not elsewhere classified: Secondary | ICD-10-CM | POA: Diagnosis not present

## 2014-09-26 LAB — COMPREHENSIVE METABOLIC PANEL (CC13)
ALBUMIN: 3.8 g/dL (ref 3.5–5.0)
ALT: 12 U/L (ref 0–55)
AST: 15 U/L (ref 5–34)
Alkaline Phosphatase: 86 U/L (ref 40–150)
Anion Gap: 10 mEq/L (ref 3–11)
BUN: 19.9 mg/dL (ref 7.0–26.0)
CO2: 25 mEq/L (ref 22–29)
Calcium: 9.4 mg/dL (ref 8.4–10.4)
Chloride: 105 mEq/L (ref 98–109)
Creatinine: 1.2 mg/dL — ABNORMAL HIGH (ref 0.6–1.1)
EGFR: 51 mL/min/{1.73_m2} — ABNORMAL LOW (ref >90)
Glucose: 98 mg/dl (ref 70–140)
POTASSIUM: 4.1 meq/L (ref 3.5–5.1)
SODIUM: 140 meq/L (ref 136–145)
Total Bilirubin: 0.5 mg/dL (ref 0.20–1.20)
Total Protein: 7.3 g/dL (ref 6.4–8.3)

## 2014-09-26 LAB — CBC WITH DIFFERENTIAL/PLATELET
BASO%: 0.7 % (ref 0.0–2.0)
Basophils Absolute: 0.1 10*3/uL (ref 0.0–0.1)
EOS%: 1.5 % (ref 0.0–7.0)
Eosinophils Absolute: 0.1 10*3/uL (ref 0.0–0.5)
HEMATOCRIT: 32.1 % — AB (ref 34.8–46.6)
HGB: 11.3 g/dL — ABNORMAL LOW (ref 11.6–15.9)
LYMPH#: 2 10*3/uL (ref 0.9–3.3)
LYMPH%: 26.5 % (ref 14.0–49.7)
MCH: 27.7 pg (ref 25.1–34.0)
MCHC: 35.2 g/dL (ref 31.5–36.0)
MCV: 78.7 fL — AB (ref 79.5–101.0)
MONO#: 0.7 10*3/uL (ref 0.1–0.9)
MONO%: 9 % (ref 0.0–14.0)
NEUT%: 62.3 % (ref 38.4–76.8)
NEUTROS ABS: 4.7 10*3/uL (ref 1.5–6.5)
Platelets: 192 10*3/uL (ref 145–400)
RBC: 4.08 10*6/uL (ref 3.70–5.45)
RDW: 15.1 % — AB (ref 11.2–14.5)
WBC: 7.6 10*3/uL (ref 3.9–10.3)

## 2014-09-26 MED ORDER — IOHEXOL 300 MG/ML  SOLN
80.0000 mL | Freq: Once | INTRAMUSCULAR | Status: AC | PRN
  Administered 2014-09-26: 80 mL via INTRAVENOUS

## 2014-09-28 ENCOUNTER — Encounter: Payer: Self-pay | Admitting: Internal Medicine

## 2014-09-28 ENCOUNTER — Ambulatory Visit (INDEPENDENT_AMBULATORY_CARE_PROVIDER_SITE_OTHER): Payer: Medicare Other | Admitting: Internal Medicine

## 2014-09-28 VITALS — BP 152/90 | HR 69 | Temp 98.2°F | Resp 12 | Ht 65.0 in | Wt 128.0 lb

## 2014-09-28 DIAGNOSIS — I10 Essential (primary) hypertension: Secondary | ICD-10-CM

## 2014-09-28 DIAGNOSIS — Z0000 Encounter for general adult medical examination without abnormal findings: Secondary | ICD-10-CM | POA: Diagnosis not present

## 2014-09-28 DIAGNOSIS — C3492 Malignant neoplasm of unspecified part of left bronchus or lung: Secondary | ICD-10-CM

## 2014-09-28 MED ORDER — TRAMADOL HCL 50 MG PO TABS
50.0000 mg | ORAL_TABLET | Freq: Four times a day (QID) | ORAL | Status: DC | PRN
Start: 2014-09-28 — End: 2014-12-28

## 2014-09-28 NOTE — Patient Instructions (Signed)
We have refilled the tramadol today. We do not need to take blood work but will see you back in about 6 months and check on the cholesterol then.   Keep up the good work with recovering from your surgery. Hopefully this has taken care of things and you won't have to do anything else.   Health Maintenance Adopting a healthy lifestyle and getting preventive care can go a long way to promote health and wellness. Talk with your health care provider about what schedule of regular examinations is right for you. This is a good chance for you to check in with your provider about disease prevention and staying healthy. In between checkups, there are plenty of things you can do on your own. Experts have done a lot of research about which lifestyle changes and preventive measures are most likely to keep you healthy. Ask your health care provider for more information. WEIGHT AND DIET  Eat a healthy diet  Be sure to include plenty of vegetables, fruits, low-fat dairy products, and lean protein.  Do not eat a lot of foods high in solid fats, added sugars, or salt.  Get regular exercise. This is one of the most important things you can do for your health.  Most adults should exercise for at least 150 minutes each week. The exercise should increase your heart rate and make you sweat (moderate-intensity exercise).  Most adults should also do strengthening exercises at least twice a week. This is in addition to the moderate-intensity exercise.  Maintain a healthy weight  Body mass index (BMI) is a measurement that can be used to identify possible weight problems. It estimates body fat based on height and weight. Your health care provider can help determine your BMI and help you achieve or maintain a healthy weight.  For females 77 years of age and older:   A BMI below 18.5 is considered underweight.  A BMI of 18.5 to 24.9 is normal.  A BMI of 25 to 29.9 is considered overweight.  A BMI of 30 and above  is considered obese.  Watch levels of cholesterol and blood lipids  You should start having your blood tested for lipids and cholesterol at 76 years of age, then have this test every 5 years.  You may need to have your cholesterol levels checked more often if:  Your lipid or cholesterol levels are high.  You are older than 77 years of age.  You are at high risk for heart disease.  CANCER SCREENING   Lung Cancer  Lung cancer screening is recommended for adults 9-41 years old who are at high risk for lung cancer because of a history of smoking.  A yearly low-dose CT scan of the lungs is recommended for people who:  Currently smoke.  Have quit within the past 15 years.  Have at least a 30-pack-year history of smoking. A pack year is smoking an average of one pack of cigarettes a day for 1 year.  Yearly screening should continue until it has been 15 years since you quit.  Yearly screening should stop if you develop a health problem that would prevent you from having lung cancer treatment.  Breast Cancer  Practice breast self-awareness. This means understanding how your breasts normally appear and feel.  It also means doing regular breast self-exams. Let your health care provider know about any changes, no matter how small.  If you are in your 20s or 30s, you should have a clinical breast exam (CBE) by  a health care provider every 1-3 years as part of a regular health exam.  If you are 40 or older, have a CBE every year. Also consider having a breast X-ray (mammogram) every year.  If you have a family history of breast cancer, talk to your health care provider about genetic screening.  If you are at high risk for breast cancer, talk to your health care provider about having an MRI and a mammogram every year.  Breast cancer gene (BRCA) assessment is recommended for women who have family members with BRCA-related cancers. BRCA-related cancers  include:  Breast.  Ovarian.  Tubal.  Peritoneal cancers.  Results of the assessment will determine the need for genetic counseling and BRCA1 and BRCA2 testing. Cervical Cancer Routine pelvic examinations to screen for cervical cancer are no longer recommended for nonpregnant women who are considered low risk for cancer of the pelvic organs (ovaries, uterus, and vagina) and who do not have symptoms. A pelvic examination may be necessary if you have symptoms including those associated with pelvic infections. Ask your health care provider if a screening pelvic exam is right for you.   The Pap test is the screening test for cervical cancer for women who are considered at risk.  If you had a hysterectomy for a problem that was not cancer or a condition that could lead to cancer, then you no longer need Pap tests.  If you are older than 65 years, and you have had normal Pap tests for the past 10 years, you no longer need to have Pap tests.  If you have had past treatment for cervical cancer or a condition that could lead to cancer, you need Pap tests and screening for cancer for at least 20 years after your treatment.  If you no longer get a Pap test, assess your risk factors if they change (such as having a new sexual partner). This can affect whether you should start being screened again.  Some women have medical problems that increase their chance of getting cervical cancer. If this is the case for you, your health care provider may recommend more frequent screening and Pap tests.  The human papillomavirus (HPV) test is another test that may be used for cervical cancer screening. The HPV test looks for the virus that can cause cell changes in the cervix. The cells collected during the Pap test can be tested for HPV.  The HPV test can be used to screen women 30 years of age and older. Getting tested for HPV can extend the interval between normal Pap tests from three to five years.  An HPV  test also should be used to screen women of any age who have unclear Pap test results.  After 76 years of age, women should have HPV testing as often as Pap tests.  Colorectal Cancer  This type of cancer can be detected and often prevented.  Routine colorectal cancer screening usually begins at 76 years of age and continues through 75 years of age.  Your health care provider may recommend screening at an earlier age if you have risk factors for colon cancer.  Your health care provider may also recommend using home test kits to check for hidden blood in the stool.  A small camera at the end of a tube can be used to examine your colon directly (sigmoidoscopy or colonoscopy). This is done to check for the earliest forms of colorectal cancer.  Routine screening usually begins at age 50.  Direct examination   of the colon should be repeated every 5-10 years through 75 years of age. However, you may need to be screened more often if early forms of precancerous polyps or small growths are found. Skin Cancer  Check your skin from head to toe regularly.  Tell your health care provider about any new moles or changes in moles, especially if there is a change in a mole's shape or color.  Also tell your health care provider if you have a mole that is larger than the size of a pencil eraser.  Always use sunscreen. Apply sunscreen liberally and repeatedly throughout the day.  Protect yourself by wearing long sleeves, pants, a wide-brimmed hat, and sunglasses whenever you are outside. HEART DISEASE, DIABETES, AND HIGH BLOOD PRESSURE   Have your blood pressure checked at least every 1-2 years. High blood pressure causes heart disease and increases the risk of stroke.  If you are between 55 years and 79 years old, ask your health care provider if you should take aspirin to prevent strokes.  Have regular diabetes screenings. This involves taking a blood sample to check your fasting blood sugar  level.  If you are at a normal weight and have a low risk for diabetes, have this test once every three years after 76 years of age.  If you are overweight and have a high risk for diabetes, consider being tested at a younger age or more often. PREVENTING INFECTION  Hepatitis B  If you have a higher risk for hepatitis B, you should be screened for this virus. You are considered at high risk for hepatitis B if:  You were born in a country where hepatitis B is common. Ask your health care provider which countries are considered high risk.  Your parents were born in a high-risk country, and you have not been immunized against hepatitis B (hepatitis B vaccine).  You have HIV or AIDS.  You use needles to inject street drugs.  You live with someone who has hepatitis B.  You have had sex with someone who has hepatitis B.  You get hemodialysis treatment.  You take certain medicines for conditions, including cancer, organ transplantation, and autoimmune conditions. Hepatitis C  Blood testing is recommended for:  Everyone born from 1945 through 1965.  Anyone with known risk factors for hepatitis C. Sexually transmitted infections (STIs)  You should be screened for sexually transmitted infections (STIs) including gonorrhea and chlamydia if:  You are sexually active and are younger than 76 years of age.  You are older than 76 years of age and your health care provider tells you that you are at risk for this type of infection.  Your sexual activity has changed since you were last screened and you are at an increased risk for chlamydia or gonorrhea. Ask your health care provider if you are at risk.  If you do not have HIV, but are at risk, it may be recommended that you take a prescription medicine daily to prevent HIV infection. This is called pre-exposure prophylaxis (PrEP). You are considered at risk if:  You are sexually active and do not regularly use condoms or know the HIV status  of your partner(s).  You take drugs by injection.  You are sexually active with a partner who has HIV. Talk with your health care provider about whether you are at high risk of being infected with HIV. If you choose to begin PrEP, you should first be tested for HIV. You should then be tested every   3 months for as long as you are taking PrEP.  PREGNANCY   If you are premenopausal and you may become pregnant, ask your health care provider about preconception counseling.  If you may become pregnant, take 400 to 800 micrograms (mcg) of folic acid every day.  If you want to prevent pregnancy, talk to your health care provider about birth control (contraception). OSTEOPOROSIS AND MENOPAUSE   Osteoporosis is a disease in which the bones lose minerals and strength with aging. This can result in serious bone fractures. Your risk for osteoporosis can be identified using a bone density scan.  If you are 65 years of age or older, or if you are at risk for osteoporosis and fractures, ask your health care provider if you should be screened.  Ask your health care provider whether you should take a calcium or vitamin D supplement to lower your risk for osteoporosis.  Menopause may have certain physical symptoms and risks.  Hormone replacement therapy may reduce some of these symptoms and risks. Talk to your health care provider about whether hormone replacement therapy is right for you.  HOME CARE INSTRUCTIONS   Schedule regular health, dental, and eye exams.  Stay current with your immunizations.   Do not use any tobacco products including cigarettes, chewing tobacco, or electronic cigarettes.  If you are pregnant, do not drink alcohol.  If you are breastfeeding, limit how much and how often you drink alcohol.  Limit alcohol intake to no more than 1 drink per day for nonpregnant women. One drink equals 12 ounces of beer, 5 ounces of wine, or 1 ounces of hard liquor.  Do not use street  drugs.  Do not share needles.  Ask your health care provider for help if you need support or information about quitting drugs.  Tell your health care provider if you often feel depressed.  Tell your health care provider if you have ever been abused or do not feel safe at home. Document Released: 12/01/2010 Document Revised: 10/02/2013 Document Reviewed: 04/19/2013 Exit[employer] Patient Information 2015 [employer]. This information is not intended to replace advice given to you by your health care provider. Make sure you discuss any questions you have with your health care provider.  

## 2014-09-28 NOTE — Progress Notes (Signed)
Pre visit review using our clinic review tool, if applicable. No additional management support is needed unless otherwise documented below in the visit note. 

## 2014-09-30 ENCOUNTER — Encounter: Payer: Self-pay | Admitting: Internal Medicine

## 2014-09-30 NOTE — Assessment & Plan Note (Signed)
Still recovering from her surgery. Stamina not back to normal and more SOB with exertion.

## 2014-09-30 NOTE — Progress Notes (Signed)
   Subjective:    Patient ID: Amy Dunn, female    DOB: [birth year], 74 y.o.   MRN: [id number]  HPI Here for medicare wellness, no new complaints. Please see A/P for status and treatment of chronic medical problems.   Diet: heart healthy Physical activity: sedentary Depression/mood screen: negative Hearing: intact to whispered voice Visual acuity: grossly normal, performs annual eye exam  ADLs: capable Fall risk: none Home safety: good Cognitive evaluation: intact to orientation, naming, recall and repetition EOL planning: adv directives discussed  I have personally reviewed and have noted 1. The patient's medical and social history - reviewed today no changes 2. Their use of alcohol, tobacco or illicit drugs 3. Their current medications and supplements 4. The patient's functional ability including ADL's, fall risks, home safety risks and hearing or visual impairment. 5. Diet and physical activities 6. Evidence for depression or mood disorders 7. Care team reviewed and updated (available in snapshot)  Review of Systems  Constitutional: Negative for fever, activity change, appetite change, fatigue and unexpected weight change.  HENT: Negative.   Eyes: Negative.   Respiratory: Negative for cough, chest tightness, shortness of breath and wheezing.   Cardiovascular: Negative for chest pain, palpitations and leg swelling.  Gastrointestinal: Negative.   Musculoskeletal: Negative.   Skin: Negative.   Neurological: Negative.   Psychiatric/Behavioral: Negative.       Objective:   Physical Exam  Constitutional: She is oriented to person, place, and time. She appears well-developed and well-nourished.  HENT:  Head: Normocephalic and atraumatic.  Eyes: EOM are normal.  Neck: Normal range of motion.  Cardiovascular: Normal rate and regular rhythm.   Pulmonary/Chest: Effort normal and breath sounds normal. No respiratory distress. She has no wheezes. She has no rales.  Abdominal:  Soft. Bowel sounds are normal.  Neurological: She is alert and oriented to person, place, and time. Coordination normal.  Skin: Skin is warm and dry.  Psychiatric: She has a normal mood and affect.   Filed Vitals:   09/28/14 1002  BP: 152/90  Pulse: 69  Temp: 98.2 F (36.8 C)  TempSrc: Oral  Resp: 12  Height: '5\' 5"'$  (1.651 m)  Weight: 128 lb (58.06 kg)  SpO2: 97%      Assessment & Plan:

## 2014-09-30 NOTE — Assessment & Plan Note (Addendum)
BP well controlled on atenolol and losartan/hctz. No labs today as recent normal on same regimen.

## 2014-09-30 NOTE — Assessment & Plan Note (Signed)
She has had colonoscopy in the past but does not remember what year, will try to review records. Needs prevnar still and she wants to wait. Given 10 year screening recommendations. 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

## 2014-10-01 ENCOUNTER — Encounter: Payer: Self-pay | Admitting: Internal Medicine

## 2014-10-01 ENCOUNTER — Ambulatory Visit (HOSPITAL_BASED_OUTPATIENT_CLINIC_OR_DEPARTMENT_OTHER): Payer: Medicare Other | Admitting: Internal Medicine

## 2014-10-01 VITALS — BP 132/68 | HR 70 | Temp 98.5°F | Resp 18 | Ht 65.0 in | Wt 129.3 lb

## 2014-10-01 DIAGNOSIS — C3432 Malignant neoplasm of lower lobe, left bronchus or lung: Secondary | ICD-10-CM | POA: Diagnosis not present

## 2014-10-01 DIAGNOSIS — C3492 Malignant neoplasm of unspecified part of left bronchus or lung: Secondary | ICD-10-CM

## 2014-10-01 NOTE — Progress Notes (Signed)
[facility] Telephone:(336) [phone]   Fax:(336) [phone]  OFFICE PROGRESS NOTE  [name], MD [place] [address]  DIAGNOSIS: Stage IA (T18, N0, M0) non-small cell lung cancer, well-differentiated adenocarcinoma diagnosed in July 2015.  PRIOR THERAPY: Status post left VATS with left lower lobe wedge resection as well as left basilar segmentectomy and lymph node dissection under the care of Dr. Roxan Hockey on 01/29/2014.  CURRENT THERAPY: Observation.  INTERVAL HISTORY: Amy Dunn 74 y.o. female returns to the clinic today for six-month follow-up visit. The patient is feeling fine today was no specific complaints except for persistent soreness on the left side of her chest after the surgical resection. She denied having any significant shortness breath except with exertion. She has no cough or hemoptysis. The patient denied having any significant weight loss or night sweats. She has no fever or chills. He has no nausea or vomiting. She had repeat CT scan of the chest performed recently and she is here for evaluation and discussion of her scan results.  MEDICAL HISTORY: Past Medical History  Diagnosis Date  . History of colonic polyps   . Acute peptic ulcer, unspecified site, with hemorrhage and perforation, without mention of obstruction   . Osteoarthritis of left shoulder   . Primary osteoarthritis of left hip   . Pulmonary nodule, left     Left Lower lobe  . GERD (gastroesophageal reflux disease)   . Cough   . Hypertension   . TIA (transient ischemic attack) 2011    transient blindness, speech- resolved  . Stroke     no residual  . Glaucoma     right eye    ALLERGIES:  is allergic to amlodipine and penicillins.  MEDICATIONS:  Current Outpatient Prescriptions  Medication Sig Dispense Refill  . atenolol (TENORMIN) 50 MG tablet Take 50 mg by mouth daily.    . dorzolamide-timolol (COSOPT) 22.3-6.8 MG/ML ophthalmic solution  Place 1 drop into the left eye daily.     . fexofenadine (ALLEGRA) 180 MG tablet Take 180 mg by mouth daily as needed. For allergies    . fluticasone (FLONASE) 50 MCG/ACT nasal spray Place 2 sprays into both nostrils daily as needed for allergies or rhinitis.    Marland Kitchen losartan-hydrochlorothiazide (HYZAAR) 100-25 MG per tablet TAKE 1/2 TABLET DAILY    . Omeprazole-Sodium Bicarbonate (ZEGERID OTC) 20-1100 MG CAPS capsule Take 1 capsule by mouth daily before breakfast.    . traMADol (ULTRAM) 50 MG tablet Take 1 tablet (50 mg total) by mouth every 6 (six) hours as needed for moderate pain. 50 tablet 0   No current facility-administered medications for this visit.    SURGICAL HISTORY:  Past Surgical History  Procedure Laterality Date  . Colon surgery    . Surgical repair of peptic ulcer    . Tubal ligation    . Eye surgery Bilateral     cataracts  . Video assisted thoracoscopy (vats)/wedge resection Left 01/29/2014    Procedure: VIDEO ASSISTED THORACOSCOPY (VATS)/BASILAR SEGMENTECTOMY  LEFT LUNG LOWER LOBE WITH FROZEN SECTION, LEFT LOWER LOBECTOMY;  Surgeon: Melrose Nakayama, MD;  Location: [facility];  Service: Thoracic;  Laterality: Left;    REVIEW OF SYSTEMS:  A comprehensive review of systems was negative except for: Respiratory: positive for pleurisy/chest pain   PHYSICAL EXAMINATION: General appearance: alert, cooperative and no distress Head: Normocephalic, without obvious abnormality, atraumatic Neck: no adenopathy, no JVD, supple, symmetrical, trachea midline and thyroid not  enlarged, symmetric, no tenderness/mass/nodules Lymph nodes: Cervical, supraclavicular, and axillary nodes normal. Resp: clear to auscultation bilaterally Back: symmetric, no curvature. ROM normal. No CVA tenderness. Cardio: regular rate and rhythm, S1, S2 normal, no murmur, click, rub or gallop GI: soft, non-tender; bowel sounds normal; no masses,  no organomegaly Extremities: extremities normal, atraumatic, no  cyanosis or edema  ECOG PERFORMANCE STATUS: 1 - Symptomatic but completely ambulatory  Blood pressure 132/68, pulse 70, temperature 98.5 F (36.9 C), temperature source Oral, resp. rate 18, height '5\' 5"'$  (1.651 m), weight 129 lb 4.8 oz (58.65 kg), SpO2 99 %.  LABORATORY DATA: Lab Results  Component Value Date   WBC 7.6 09/26/2014   HGB 11.3* 09/26/2014   HCT 32.1* 09/26/2014   MCV 78.7* 09/26/2014   PLT 192 09/26/2014      Chemistry      Component Value Date/Time   NA 140 09/26/2014 0943   NA 136* 02/02/2014 0304   K 4.1 09/26/2014 0943   K 4.1 02/02/2014 0304   CL 100 02/02/2014 0304   CO2 25 09/26/2014 0943   CO2 24 02/02/2014 0304   BUN 19.9 09/26/2014 0943   BUN 18 02/02/2014 0304   CREATININE 1.2* 09/26/2014 0943   CREATININE 1.29* 02/02/2014 0304      Component Value Date/Time   CALCIUM 9.4 09/26/2014 0943   CALCIUM 8.2* 02/02/2014 0304   ALKPHOS 86 09/26/2014 0943   ALKPHOS 91 01/31/2014 0400   AST 15 09/26/2014 0943   AST 27 01/31/2014 0400   ALT 12 09/26/2014 0943   ALT 16 01/31/2014 0400   BILITOT 0.50 09/26/2014 0943   BILITOT 0.3 01/31/2014 0400       RADIOGRAPHIC STUDIES: Ct Chest W Contrast  09/26/2014   CLINICAL DATA:  74 year old female with history of lung cancer diagnosed in 2015 in the left lower lobe status post wedge resection and lymph node dissection. Followup evaluation.  EXAM: CT CHEST WITH CONTRAST  TECHNIQUE: Multidetector CT imaging of the chest was performed during intravenous contrast administration.  CONTRAST:  46m OMNIPAQUE IOHEXOL 300 MG/ML  SOLN  COMPARISON:  PET-CT 11/29/2013.  FINDINGS: Mediastinum/Lymph Nodes: Heart size is normal. There is no significant pericardial fluid, thickening or pericardial calcification. No pathologically enlarged mediastinal or hilar lymph nodes. Esophagus is unremarkable in appearance. No axillary lymphadenopathy.  Lungs/Pleura: Postoperative changes of wedge resection are noted in the left lower lobe.  No definite soft tissue mass or nodule identified to suggest local recurrence of disease. No nodules elsewhere in the lungs to suggest metastatic disease. There is a small amount of what is likely chronic (based on comparison with multiple prior chest x-rays) left pleural fluid, some of which tracks slightly into the inferior aspect of the left major fissure. No acute consolidative airspace disease.  Upper Abdomen: Multiple well-defined low-attenuation renal lesions bilaterally, compatible with simple cysts, largest of which measure up to 1.7 cm in the upper pole of both kidneys. In addition, there several well-defined low-attenuation hepatic lesions, also compatible with simple cysts, largest of which measures 1.8 cm in the central aspect of segment 5.  Musculoskeletal/Soft Tissues: There are no aggressive appearing lytic or blastic lesions noted in the visualized portions of the skeleton.  IMPRESSION: 1. Status post wedge resection in the left lower lobe, without findings to suggest local recurrence of disease or metastatic disease elsewhere in the thorax on today's examination. 2. Small chronic left-sided pleural effusion. 3. Additional incidental findings, as above.   Electronically Signed   By: DQuillian Quince  Entrikin M.D.   On: 09/26/2014 12:33    ASSESSMENT AND PLAN: This is a very pleasant 74 years old [place] American female recently diagnosed with a stage IA non-small cell lung cancer status post left lower lobe wedge resection and currently on observation. The patient is doing fine with no evidence for disease recurrence on the recent scan. I discussed the scan results with the patient and recommended for her to continue on observation. I would see her back for follow-up visit in 6 months with repeat CT scan of the chest. She was advised to call immediately if she has any concerning symptoms in the interval. The patient voices understanding of current disease status and treatment options and is in agreement  with the current care plan.  All questions were answered. The patient knows to call the clinic with any problems, questions or concerns. We can certainly see the patient much sooner if necessary.  Disclaimer: This note was dictated with voice recognition software. Similar sounding words can inadvertently be transcribed and may not be corrected upon review.

## 2014-10-02 ENCOUNTER — Telehealth: Payer: Self-pay | Admitting: Internal Medicine

## 2014-10-02 ENCOUNTER — Encounter: Payer: Self-pay | Admitting: Thoracic Surgery (Cardiothoracic Vascular Surgery)

## 2014-10-02 ENCOUNTER — Ambulatory Visit: Payer: Medicare Other | Admitting: Thoracic Surgery (Cardiothoracic Vascular Surgery)

## 2014-10-02 ENCOUNTER — Ambulatory Visit (INDEPENDENT_AMBULATORY_CARE_PROVIDER_SITE_OTHER): Payer: Medicare Other | Admitting: Thoracic Surgery (Cardiothoracic Vascular Surgery)

## 2014-10-02 VITALS — BP 140/79 | HR 62 | Resp 20 | Ht 65.0 in | Wt 129.0 lb

## 2014-10-02 DIAGNOSIS — Z85118 Personal history of other malignant neoplasm of bronchus and lung: Secondary | ICD-10-CM

## 2014-10-02 DIAGNOSIS — Z9889 Other specified postprocedural states: Secondary | ICD-10-CM

## 2014-10-02 DIAGNOSIS — Z902 Acquired absence of lung [part of]: Secondary | ICD-10-CM

## 2014-10-02 NOTE — Telephone Encounter (Signed)
per pof to sch pt appt-cld & gave pt time & date of appt-adv pt to get contrast closer to appt

## 2014-10-02 NOTE — Progress Notes (Signed)
[address]       [place] [address]             [phone]       HPI:  Mrs. Kueker returns today for a scheduled follow-up visit. She is a 74 year old woman who had a thoracoscopic left lower lobe basilar segmentectomy on 01/29/2014 for a stage IA adenocarcinoma. I last saw her in the office in September 2015. At that time she was still having considerable incisional pain, but otherwise was doing well.  She still has some incisional discomfort. She is complains of more of an aching and soreness rather than a sharp pain. She has been taking Ultram for that, mostly at night. She still gets short of breath with heavy exertion, but does not have any issues with normal activities. Her weight is stable. She denies any unusual headaches or visual changes. She has not had any unusual cough, hemoptysis, or wheezing.  Past Medical History  Diagnosis Date  . History of colonic polyps   . Acute peptic ulcer, unspecified site, with hemorrhage and perforation, without mention of obstruction   . Osteoarthritis of left shoulder   . Primary osteoarthritis of left hip   . Pulmonary nodule, left     Left Lower lobe  . GERD (gastroesophageal reflux disease)   . Cough   . Hypertension   . TIA (transient ischemic attack) 2011    transient blindness, speech- resolved  . Stroke     no residual  . Glaucoma     right eye    Current Outpatient Prescriptions  Medication Sig Dispense Refill  . atenolol (TENORMIN) 50 MG tablet Take 50 mg by mouth daily.    . dorzolamide-timolol (COSOPT) 22.3-6.8 MG/ML ophthalmic solution Place 1 drop into the left eye daily.     . fexofenadine (ALLEGRA) 180 MG tablet Take 180 mg by mouth daily as needed. For allergies    . fluticasone (FLONASE) 50 MCG/ACT nasal spray Place 2 sprays into both nostrils daily as needed for allergies or rhinitis.    Marland Kitchen losartan-hydrochlorothiazide (HYZAAR) 100-25 MG per tablet TAKE 1/2 TABLET DAILY    .  Omeprazole-Sodium Bicarbonate (ZEGERID OTC) 20-1100 MG CAPS capsule Take 1 capsule by mouth daily before breakfast.    . traMADol (ULTRAM) 50 MG tablet Take 1 tablet (50 mg total) by mouth every 6 (six) hours as needed for moderate pain. 50 tablet 0   No current facility-administered medications for this visit.    Physical Exam BP 140/79 mmHg  Pulse 62  Resp 20  Ht '5\' 5"'$  (1.651 m)  Wt 129 lb (58.514 kg)  BMI 21.47 kg/m2  SpO54 26% 74 year old woman in no acute distress Well-developed well-nourished Alert and oriented 3 with no focal deficits No cervical or supraclavicular adenopathy Cardiac regular rate and rhythm normal S1 and S2 Lungs clear with slightly diminished breath sounds at left base, no wheezing Incisions well healed  Diagnostic Tests: CT CHEST WITH CONTRAST  TECHNIQUE: Multidetector CT imaging of the chest was performed during intravenous contrast administration.  CONTRAST: 85m OMNIPAQUE IOHEXOL 300 MG/ML SOLN  COMPARISON: PET-CT 11/29/2013.  FINDINGS: Mediastinum/Lymph Nodes: Heart size is normal. There is no significant pericardial fluid, thickening or pericardial calcification. No pathologically enlarged mediastinal or hilar lymph nodes. Esophagus is unremarkable in appearance. No axillary lymphadenopathy.  Lungs/Pleura: Postoperative changes of wedge resection are noted in the left lower lobe. No definite soft tissue mass or nodule identified to suggest local recurrence of disease.  No nodules elsewhere in the lungs to suggest metastatic disease. There is a small amount of what is likely chronic (based on comparison with multiple prior chest x-rays) left pleural fluid, some of which tracks slightly into the inferior aspect of the left major fissure. No acute consolidative airspace disease.  Upper Abdomen: Multiple well-defined low-attenuation renal lesions bilaterally, compatible with simple cysts, largest of which measure up to 1.7 cm in  the upper pole of both kidneys. In addition, there several well-defined low-attenuation hepatic lesions, also compatible with simple cysts, largest of which measures 1.8 cm in the central aspect of segment 5.  Musculoskeletal/Soft Tissues: There are no aggressive appearing lytic or blastic lesions noted in the visualized portions of the skeleton.  IMPRESSION: 1. Status post wedge resection in the left lower lobe, without findings to suggest local recurrence of disease or metastatic disease elsewhere in the thorax on today's examination. 2. Small chronic left-sided pleural effusion. 3. Additional incidental findings, as above.   Electronically Signed  By: Vinnie Langton M.D.  On: 09/26/2014 12:33   Impression: 74 year old woman who is now almost 9 months out from a thoracoscopic left lower lobe basilar segmentectomy for stage IA non-small cell carcinoma.   I personally reviewed the CT and agree with findings as noted in the official report.   She has no evidence recurrent disease at this time.  She is still having some incisional discomfort. I suggested that she try something along the lines of naproxen or ibuprofen to see if the anti-inflammatory effect makes any difference. Of course she can still use the tramadol as needed.   Plan: Return in 6 months after CT of chest for one year follow-up visit.  Melrose Nakayama, MD [facility] [phone]

## 2014-10-02 NOTE — Telephone Encounter (Signed)
mailed copy of sch to pt

## 2014-10-19 ENCOUNTER — Other Ambulatory Visit: Payer: Self-pay

## 2014-10-19 MED ORDER — LOSARTAN POTASSIUM-HCTZ 100-25 MG PO TABS
1.0000 | ORAL_TABLET | Freq: Every day | ORAL | Status: DC
Start: 2014-10-19 — End: 2015-12-16

## 2014-12-28 ENCOUNTER — Ambulatory Visit (INDEPENDENT_AMBULATORY_CARE_PROVIDER_SITE_OTHER): Payer: Medicare Other | Admitting: Internal Medicine

## 2014-12-28 ENCOUNTER — Encounter: Payer: Self-pay | Admitting: Internal Medicine

## 2014-12-28 VITALS — BP 142/84 | HR 65 | Temp 98.2°F | Resp 12 | Ht 65.0 in | Wt 132.0 lb

## 2014-12-28 DIAGNOSIS — C3492 Malignant neoplasm of unspecified part of left bronchus or lung: Secondary | ICD-10-CM

## 2014-12-28 MED ORDER — TRAMADOL HCL 50 MG PO TABS: 50.0000 mg | ORAL_TABLET | Freq: Four times a day (QID) | ORAL | Status: DC | PRN

## 2014-12-28 NOTE — Progress Notes (Signed)
   Subjective:    Patient ID: Amy Dunn, female    DOB: 1940/12/27, 74 y.o.   MRN: [id number]  HPI The patient is a 74 YO female who is coming in today for some swelling in her armpit. It is going on for 3 weeks and is a new problem. She thinks that it could be related to where they took out her lymph nodes with recent (in the last year) surgery for lung cancer. She denies any new symptoms such as cough, SOB, weight change. Some mild pain in the area, taking alleve with good results. Has not tried anything else for it.   Review of Systems  Constitutional: Negative.   Respiratory: Negative for cough, chest tightness, shortness of breath and wheezing.   Cardiovascular: Negative for chest pain, palpitations and leg swelling.  Gastrointestinal: Negative for diarrhea, constipation and abdominal distention.  Musculoskeletal: Positive for myalgias.       Arm swelling  Neurological: Negative.       Objective:   Physical Exam  Constitutional: She is oriented to person, place, and time. She appears well-developed and well-nourished.  HENT:  Head: Normocephalic and atraumatic.  Eyes: EOM are normal.  Neck: Normal range of motion.  Cardiovascular: Normal rate and regular rhythm.   Pulmonary/Chest: Effort normal and breath sounds normal. No respiratory distress. She has no wheezes. She has no rales.  Some swelling in the armpit and upper triceps region, tender to touch. No heat and no discernible fluctuance.   Abdominal: Soft.  Neurological: She is alert and oriented to person, place, and time.  Skin: Skin is warm and dry.   Filed Vitals:   12/28/14 1013  BP: 142/84  Pulse: 65  Temp: 98.2 F (36.8 C)  TempSrc: Oral  Resp: 12  Height: '5\' 5"'$  (1.651 m)  Weight: 132 lb (59.875 kg)  SpO2: 98%      Assessment & Plan:

## 2014-12-28 NOTE — Assessment & Plan Note (Signed)
Resected in the last year and now having some lymphedema in the armpit. Likely normal but will forward to her CT surgeon to see if this is an indication to move up her 6 month follow up CT (due in late October). No reason to suspect DVT or infection.

## 2014-12-28 NOTE — Patient Instructions (Signed)
We have refilled the tramadol which you can use for pain. Also try some heat on the area to help with the pain.   I will send a message to the surgeon to make sure that there is nothing else we need to do for the swelling to make sure it is okay.

## 2014-12-28 NOTE — Progress Notes (Signed)
Pre visit review using our clinic review tool, if applicable. No additional management support is needed unless otherwise documented below in the visit note. 

## 2015-02-01 ENCOUNTER — Ambulatory Visit (INDEPENDENT_AMBULATORY_CARE_PROVIDER_SITE_OTHER)
Admission: RE | Admit: 2015-02-01 | Discharge: 2015-02-01 | Disposition: A | Discharge status: Home / Self Care | Payer: Medicare Other | Source: Ambulatory Visit | Attending: Internal Medicine | Admitting: Internal Medicine

## 2015-02-01 ENCOUNTER — Ambulatory Visit (INDEPENDENT_AMBULATORY_CARE_PROVIDER_SITE_OTHER): Payer: Medicare Other | Admitting: Internal Medicine

## 2015-02-01 ENCOUNTER — Encounter: Payer: Self-pay | Admitting: Internal Medicine

## 2015-02-01 VITALS — BP 152/86 | HR 79 | Temp 98.0°F | Resp 16 | Ht 65.0 in | Wt 130.0 lb

## 2015-02-01 DIAGNOSIS — M25512 Pain in left shoulder: Secondary | ICD-10-CM | POA: Diagnosis not present

## 2015-02-01 DIAGNOSIS — R0789 Other chest pain: Secondary | ICD-10-CM

## 2015-02-01 DIAGNOSIS — R05 Cough: Secondary | ICD-10-CM | POA: Diagnosis not present

## 2015-02-01 DIAGNOSIS — M19012 Primary osteoarthritis, left shoulder: Secondary | ICD-10-CM | POA: Diagnosis not present

## 2015-02-01 DIAGNOSIS — C3492 Malignant neoplasm of unspecified part of left bronchus or lung: Secondary | ICD-10-CM | POA: Diagnosis not present

## 2015-02-01 DIAGNOSIS — Z1231 Encounter for screening mammogram for malignant neoplasm of breast: Secondary | ICD-10-CM | POA: Insufficient documentation

## 2015-02-01 DIAGNOSIS — R0781 Pleurodynia: Secondary | ICD-10-CM | POA: Insufficient documentation

## 2015-02-01 NOTE — Patient Instructions (Signed)

## 2015-02-01 NOTE — Progress Notes (Signed)
Pre visit review using our clinic review tool, if applicable. No additional management support is needed unless otherwise documented below in the visit note. 

## 2015-02-04 NOTE — Progress Notes (Signed)
Subjective:  Patient ID: Amy Dunn, female    DOB: January 01, 1941  Age: 74 y.o. MRN: [id number]  CC: Shoulder Pain   HPI Amy Dunn presents for left shoulder pain for about a year with decreasing range of motion. The pain radiates into her left lower chest area. She tells me that she previously saw an orthopedic surgeon about this and had an MRI done. I do not have the results of that MRI and she doesn't know what the results were. It sounds like it was done maybe a half a year ago. She takes tramadol for pain and gets relief.  Outpatient Prescriptions Prior to Visit  Medication Sig Dispense Refill  . atenolol (TENORMIN) 50 MG tablet Take 50 mg by mouth daily.    . dorzolamide-timolol (COSOPT) 22.3-6.8 MG/ML ophthalmic solution Place 1 drop into the left eye daily.     . fexofenadine (ALLEGRA) 180 MG tablet Take 180 mg by mouth daily as needed. For allergies    . fluticasone (FLONASE) 50 MCG/ACT nasal spray Place 2 sprays into both nostrils daily as needed for allergies or rhinitis.    Marland Kitchen losartan-hydrochlorothiazide (HYZAAR) 100-25 MG per tablet Take 1 tablet by mouth daily. 90 tablet 3  . Omeprazole-Sodium Bicarbonate (ZEGERID OTC) 20-1100 MG CAPS capsule Take 1 capsule by mouth daily before breakfast.    . traMADol (ULTRAM) 50 MG tablet Take 1 tablet (50 mg total) by mouth every 6 (six) hours as needed for moderate pain. 50 tablet 0   No facility-administered medications prior to visit.    ROS Review of Systems  Constitutional: Negative.  Negative for fever, chills, diaphoresis, appetite change and fatigue.  HENT: Negative.   Eyes: Negative.   Respiratory: Negative.  Negative for cough, choking, chest tightness, shortness of breath and stridor.   Cardiovascular: Positive for chest pain (sharp, intermittent pain along her left lower rib cage.). Negative for palpitations and leg swelling.  Gastrointestinal: Negative.  Negative for nausea, abdominal pain, diarrhea, constipation and  blood in stool.  Endocrine: Negative.   Genitourinary: Negative.   Musculoskeletal: Positive for arthralgias. Negative for myalgias, back pain and joint swelling.  Skin: Negative.   Allergic/Immunologic: Negative.   Neurological: Negative.   Hematological: Negative.  Negative for adenopathy. Does not bruise/bleed easily.  Psychiatric/Behavioral: Negative.     Objective:  BP 152/86 mmHg  Pulse 79  Temp(Src) 98 F (36.7 C) (Oral)  Ht '5\' 5"'$  (1.651 m)  Wt 130 lb (58.968 kg)  BMI 21.63 kg/m2  SpO2 96%  BP Readings from Last 3 Encounters:  02/01/15 152/86  12/28/14 142/84  10/02/14 140/79    Wt Readings from Last 3 Encounters:  02/01/15 130 lb (58.968 kg)  12/28/14 132 lb (59.875 kg)  10/02/14 129 lb (58.514 kg)    Physical Exam  Constitutional: She is oriented to person, place, and time. No distress.  HENT:  Mouth/Throat: Oropharynx is clear and moist. No oropharyngeal exudate.  Eyes: Conjunctivae are normal. Right eye exhibits no discharge. Left eye exhibits no discharge. No scleral icterus.  Neck: Normal range of motion. Neck supple. No JVD present. No tracheal deviation present. No thyromegaly present.  Cardiovascular: Normal rate, regular rhythm, normal heart sounds and intact distal pulses.  Exam reveals no gallop and no friction rub.   No murmur heard. Pulmonary/Chest: Effort normal and breath sounds normal. No stridor. No respiratory distress. She has no decreased breath sounds. She has no wheezes. She has no rhonchi. She has no rales. Chest wall is  not dull to percussion. She exhibits tenderness. She exhibits no mass, no bony tenderness, no laceration, no crepitus, no edema, no deformity, no swelling and no retraction. Right breast exhibits no inverted nipple, no mass, no nipple discharge, no skin change and no tenderness. Left breast exhibits no inverted nipple, no mass, no nipple discharge, no skin change and no tenderness. Breasts are symmetrical.    Abdominal: Soft.  Bowel sounds are normal. She exhibits no distension and no mass. There is no tenderness. There is no rebound and no guarding.  Musculoskeletal: She exhibits no edema or tenderness.       Left shoulder: She exhibits decreased range of motion. She exhibits no bony tenderness, no swelling, no effusion, no crepitus, no deformity, no laceration, no pain, no spasm, normal pulse and normal strength. Tenderness: in the [facility] joint.  Lymphadenopathy:    She has no cervical adenopathy.  Neurological: She is oriented to person, place, and time.  Skin: Skin is warm and dry. No rash noted. She is not diaphoretic. No erythema. No pallor.  Psychiatric: She has a normal mood and affect. Her behavior is normal. Judgment and thought content normal.  Vitals reviewed.   Lab Results  Component Value Date   WBC 7.6 09/26/2014   HGB 11.3* 09/26/2014   HCT 32.1* 09/26/2014   PLT 192 09/26/2014   GLUCOSE 98 09/26/2014   CHOL 173 01/18/2012   TRIG 130 01/18/2012   HDL 44 01/18/2012   LDLDIRECT 100.8 03/18/2010   LDLCALC 103* 01/18/2012   ALT 12 09/26/2014   AST 15 09/26/2014   NA 140 09/26/2014   K 4.1 09/26/2014   CL 100 02/02/2014   CREATININE 1.2* 09/26/2014   BUN 19.9 09/26/2014   CO2 25 09/26/2014   TSH 0.66 10/22/2011   INR 0.97 01/25/2014   HGBA1C 5.7* 01/17/2012    Dg Chest 2 View  02/01/2015   CLINICAL DATA:  Chronic cough, hypertension, prior left lower lobe and resection  EXAM: CHEST  2 VIEW  COMPARISON:  CT chest 09/26/2014  FINDINGS: There is a small partially loculated left pleural effusion unchanged in the prior exam. There is left lung volume loss from prior left lung partial lobectomy. There is no focal parenchymal opacity. There is no pleural effusion or pneumothorax. The heart and mediastinal contours are unremarkable.  The osseous structures are unremarkable.  IMPRESSION: No active cardiopulmonary disease.  Stable small left pleural effusion.   Electronically Signed   By: Kathreen Devoid    On: 02/01/2015 15:12   Dg Shoulder Left  02/01/2015   CLINICAL DATA:  Left shoulder pain under the arm.  No known injury.  EXAM: LEFT SHOULDER - 2+ VIEW  COMPARISON:  None  FINDINGS: There is no fracture or dislocation. There are mild degenerative changes of the acromioclavicular joint.  IMPRESSION: No acute osseous injury of the is left shoulder.   Electronically Signed   By: Kathreen Devoid   On: 02/01/2015 15:11    Assessment & Plan:   Amy Dunn was seen today for shoulder pain.  Diagnoses and all orders for this visit:  Left shoulder pain- exam and x-ray are consistent with osteoarthritis in the [facility] joint. -     DG Shoulder Left; Future  Acute chest wall pain- exam and plain x-ray are normal, his may be radiating from the shoulder. -     DG Chest 2 View; Future  Adenocarcinoma of lung, stage 1, left- there is no evidence of recurrence at this time.  Visit for screening mammogram -     MM DIGITAL SCREENING BILATERAL; Future  Primary osteoarthritis of left shoulder- I have asked her to follow-up again with her orthopedist to see if there is a procedure something that can be done to reduce her pain and increase her range of motion. -     Ambulatory referral to Sports Medicine   I am having Amy Dunn maintain her fexofenadine, dorzolamide-timolol, atenolol, fluticasone, Omeprazole-Sodium Bicarbonate, losartan-hydrochlorothiazide, and traMADol.  No orders of the defined types were placed in this encounter.     Follow-up: Return in about 4 weeks (around 03/01/2015).  Scarlette Calico, MD

## 2015-02-12 ENCOUNTER — Telehealth: Payer: Self-pay | Admitting: Internal Medicine

## 2015-02-12 NOTE — Telephone Encounter (Signed)
Patient would like to know the results of the xray she had done on her shoulder.

## 2015-02-28 ENCOUNTER — Ambulatory Visit (INDEPENDENT_AMBULATORY_CARE_PROVIDER_SITE_OTHER): Payer: Medicare Other | Admitting: Family Medicine

## 2015-02-28 ENCOUNTER — Encounter: Payer: Self-pay | Admitting: Family Medicine

## 2015-02-28 ENCOUNTER — Other Ambulatory Visit (INDEPENDENT_AMBULATORY_CARE_PROVIDER_SITE_OTHER): Payer: Medicare Other

## 2015-02-28 ENCOUNTER — Ambulatory Visit: Payer: Medicare Other | Admitting: Internal Medicine

## 2015-02-28 VITALS — BP 130/80 | HR 67 | Ht 65.0 in | Wt 131.0 lb

## 2015-02-28 DIAGNOSIS — M75102 Unspecified rotator cuff tear or rupture of left shoulder, not specified as traumatic: Secondary | ICD-10-CM

## 2015-02-28 DIAGNOSIS — M25512 Pain in left shoulder: Secondary | ICD-10-CM

## 2015-02-28 DIAGNOSIS — M75100 Unspecified rotator cuff tear or rupture of unspecified shoulder, not specified as traumatic: Secondary | ICD-10-CM | POA: Insufficient documentation

## 2015-02-28 NOTE — Progress Notes (Signed)
Pre visit review using our clinic review tool, if applicable. No additional management support is needed unless otherwise documented below in the visit note. 

## 2015-02-28 NOTE — Assessment & Plan Note (Signed)
Patient doesn't more of a rotator cuff tear noted today. Patient elected to try to have an injection. Patient tolerated the procedure fairly well. Did have increasing range of motion and decreasing pain immediately which is an optimistic sign. Encourage patient to avoid any overhead activities. Patient given home exercises and work with Product/process development scientist today. We discussed which activities would be beneficial. Patient will try topical anti-inflammatory. Return to clinic in 3 weeks.

## 2015-02-28 NOTE — Patient Instructions (Signed)
Good to meet you Ice 20 minutes 2 times daily. Usually after activity and before bed. Exercises 3 times a week.  pennsaid pinkie amount topically 2 times daily as needed.  Avoid overhead lifting if possible.  See me again in 3 weeks to make sure the tear does not get worse.

## 2015-02-28 NOTE — Progress Notes (Signed)
[facility] [address] [place], [place] [address] Phone: [phone] Subjective:    I'm seeing this patient by the request  of:  [name], MD   CC: Shoulder pain, left  [id number] Amy Dunn is a 74 y.o. female coming in with complaint of left shoulder pain. Patient is had this pain for multiple weeks. Seems to be getting worse. States that she is not even able to sleep at night secondary to the pain. Patient is not able to do daily activities such as dressing herself alone at this time. Patient rates the severity of pain a 9 out of 10. Had a very similar presentation on the contralateral side that did have a partial rotator cuff tear that didn't get better. States that this seems to be worse. Is accompanied with weakness and some radiation down the arm. Denies any neck pain that seems to be associated with it. Patient did have x-rays previously that were reviewed by me and showed mild osteophytic changes.  Past Medical History  Diagnosis Date  . History of colonic polyps   . Acute peptic ulcer, unspecified site, with hemorrhage and perforation, without mention of obstruction   . Osteoarthritis of left shoulder   . Primary osteoarthritis of left hip   . Pulmonary nodule, left     Left Lower lobe  . GERD (gastroesophageal reflux disease)   . Cough   . Hypertension   . TIA (transient ischemic attack) 2011    transient blindness, speech- resolved  . Stroke     no residual  . Glaucoma     right eye   Past Surgical History  Procedure Laterality Date  . Colon surgery    . Surgical repair of peptic ulcer    . Tubal ligation    . Eye surgery Bilateral     cataracts  . Video assisted thoracoscopy (vats)/wedge resection Left 01/29/2014    Procedure: VIDEO ASSISTED THORACOSCOPY (VATS)/BASILAR SEGMENTECTOMY  LEFT LUNG LOWER LOBE WITH FROZEN SECTION, LEFT LOWER LOBECTOMY;  Surgeon: Melrose Nakayama, MD;  Location: [place] [facility];  Service:  Thoracic;  Laterality: Left;   Social History  Substance Use Topics  . Smoking status: Former Smoker -- 1 years    Types: Cigarettes    Quit date: 05/26/1981  . Smokeless tobacco: Never Used     Comment: 2 ciggs per day  . Alcohol Use: No   Allergies  Allergen Reactions  . Amlodipine Other (See Comments)    Trouble swallowing  . Penicillins Itching   Family History  Problem Relation Age of Onset  . Hypertension Mother   . Heart disease Mother   . Heart attack Mother   . Cancer Father     Colon cancer        Past medical history, social, surgical and family history all reviewed in electronic medical record.   Review of Systems: No headache, visual changes, nausea, vomiting, diarrhea, constipation, dizziness, abdominal pain, skin rash, fevers, chills, night sweats, weight loss, swollen lymph nodes, body aches, joint swelling, muscle aches, chest pain, shortness of breath, mood changes.   Objective Blood pressure 130/80, pulse 67, height '5\' 5"'$  (1.651 m), weight 131 lb (59.421 kg), SpO2 97 %.  General: No apparent distress alert and oriented x3 mood and affect normal, dressed appropriately.  HEENT: Pupils equal, extraocular movements intact  Respiratory: Patient's speak in full sentences and does not appear short of breath  Cardiovascular: No lower extremity edema, non tender, no  erythema  Skin: Warm dry intact with no signs of infection or rash on extremities or on axial skeleton.  Abdomen: Soft nontender  Neuro: Cranial nerves II through XII are intact, neurovascularly intact in all extremities with 2+ DTRs and 2+ pulses.  Lymph: No lymphadenopathy of posterior or anterior cervical chain or axillae bilaterally.  Gait normal with good balance and coordination.  MSK:  Non tender with full range of motion and good stability and symmetric strength and tone of shoulders, elbows, wrist, hip, knee and ankles bilaterally. Osteophytic changes of multiple joints Shoulder:  left Inspection reveals no abnormalities, atrophy or asymmetry. Palpation is normal with no tenderness over AC joint or bicipital groove. Passively patient does have full range of motion but is severely tender with greater than 100 of forward flexion or internal rotation past the lateral hip. Rotator cuff strength 3 out of 5 compared to 5 out of 5 on the contralateral side signs of impingement with positive Neer and Hawkin's tests, but negative empty can sign. Speeds and Yergason's tests normal. Positive labral pathology. Normal scapular function observed. No painful arc and no drop arm sign. No apprehension sign  MSK US performed of: left This study was ordered, performed, and interpreted by Charlann Boxer D.O.  Shoulder:   Supraspinatus:  Near full-thickness tear noted but no significant retraction noted. Hypoechoic changes and increasing Doppler flow noted. Infraspinatus:  Appears normal on long and transverse views. Significant increase in Doppler flow Subscapularis: Degenerative changes noted with 50% tear Teres Minor:  Appears normal on long and transverse views. AC joint:  Mild to moderate arthritis Glenohumeral Joint:  Moderate osteophytic changes Glenoid Labrum: Tear on the posterior aspect of the posterior Abram noted Biceps Tendon:  Degenerative changes noted  Impression: Rotator cuff tear near full-thickness but no retraction  Procedure: Real-time Ultrasound Guided Injection of left glenohumeral joint Device: GE Logiq E  Ultrasound guided injection is preferred based studies that show increased duration, increased effect, greater accuracy, decreased procedural pain, increased response rate with ultrasound guided versus blind injection.  Verbal informed consent obtained.  Time-out conducted.  Noted no overlying erythema, induration, or other signs of local infection.  Skin prepped in a sterile fashion.  Local anesthesia: Topical Ethyl chloride.  With sterile technique and  under real time ultrasound guidance:  Joint visualized.  23g 1  inch needle inserted posterior approach. Pictures taken for needle placement. Patient did have injection of 2 cc of 1% lidocaine, 2 cc of 0.5% Marcaine, and 1.0 cc of Kenalog 40 mg/dL. Completed without difficulty  Pain immediately resolved suggesting accurate placement of the medication.  Advised to call if fevers/chills, erythema, induration, drainage, or persistent bleeding.  Images permanently stored and available for review in the ultrasound unit.  Impression: Technically successful ultrasound guided injection.  Procedure note 70350; 15 minutes spent for Therapeutic exercises as stated in above notes.  This included exercises focusing on stretching, strengthening, with significant focus on eccentric aspects.Shoulder Exercises that included:  Basic scapular stabilization to include adduction and depression of scapula Scaption, focusing on proper movement and good control Internal and External rotation utilizing a theraband, with elbow tucked at side entire time Rows with theraband   Proper technique shown and discussed handout in great detail with ATC.  All questions were discussed and answered.      Impression and Recommendations:     This case required medical decision making of moderate complexity.   ;l

## 2015-03-02 ENCOUNTER — Other Ambulatory Visit: Payer: Self-pay | Admitting: Internal Medicine

## 2015-03-05 ENCOUNTER — Telehealth: Payer: Self-pay | Admitting: Pulmonary Disease

## 2015-03-05 NOTE — Telephone Encounter (Signed)
Spoke with pt, requesting a cough medicine.   Pt c/o prod cough with white mucus since yesterday evening.  Denies fever. Pt has taken tessalon perles, has not helped cough. Pt uses [facility] on [address].    Sending to doc of the day as RA is post 11pm E-link and unavailable. SN please advise on recs.  Thanks!

## 2015-03-05 NOTE — Telephone Encounter (Signed)
Per SN >> pt has not seen anyone in this office since 07/2014. She has seen her PCP and Dr. Ronnald Ramp recently. We can't give her narcotic cough syrup, she can try Delsym OTC and Mucinex. Dr. Doug Sou gave her Tramadol, this can be used for cough.  Pt is aware of SN's recommendations. Nothing further was needed.

## 2015-03-14 ENCOUNTER — Telehealth: Payer: Self-pay | Admitting: Internal Medicine

## 2015-03-14 NOTE — Telephone Encounter (Signed)
PAL - moved 11/2 f/u to 11/8. Lab/ct remain 10/27. Not able to reach patient. Schedule mail - patient also mychart active.

## 2015-03-21 ENCOUNTER — Other Ambulatory Visit (INDEPENDENT_AMBULATORY_CARE_PROVIDER_SITE_OTHER): Payer: Medicare Other

## 2015-03-21 ENCOUNTER — Ambulatory Visit (INDEPENDENT_AMBULATORY_CARE_PROVIDER_SITE_OTHER): Payer: Medicare Other | Admitting: Family Medicine

## 2015-03-21 ENCOUNTER — Encounter: Payer: Self-pay | Admitting: Family Medicine

## 2015-03-21 VITALS — BP 118/72 | HR 70 | Ht 65.0 in | Wt 130.0 lb

## 2015-03-21 DIAGNOSIS — M75102 Unspecified rotator cuff tear or rupture of left shoulder, not specified as traumatic: Secondary | ICD-10-CM | POA: Diagnosis not present

## 2015-03-21 DIAGNOSIS — M25512 Pain in left shoulder: Secondary | ICD-10-CM | POA: Diagnosis not present

## 2015-03-21 NOTE — Patient Instructions (Signed)
Good to see you Keep doing what you are doing Ice is your friend Continue the vitamins Exercises 3 times a week.  See me again in 4-6 weeks and we will make sure healing.

## 2015-03-21 NOTE — Progress Notes (Signed)
Pre visit review using our clinic review tool, if applicable. No additional management support is needed unless otherwise documented below in the visit note. 

## 2015-03-21 NOTE — Assessment & Plan Note (Signed)
Patient is making some improvement but continues to have pain. I'm concerned the patient may not heal completely. We discussed continuing the vitamin D supplementation to help with the healing process as well as the home exercises. We discussed possible formal physical therapy which patient declined. We also discussed potential need for advance imaging a patient does not make significant improvement. Patient's x-rays do not show any significant arthritis. Patient's ultrasound does. There is a possibility that advance imaging would be warranted to help [place] decide if patient would be a surgical candidate or not. Hopefully patient will continue to heal appropriately. Patient come back and see me again in 4-6 weeks for further evaluation and treatment. All questions were answered today.  Spent  25 minutes with patient face-to-face and had greater than 50% of counseling including as described above in assessment and plan.

## 2015-03-21 NOTE — Progress Notes (Signed)
[facility] [place], [place] [address] Phone: [phone] Subjective:    I'm seeing this patient by the request  of:  [name], MD   CC: Shoulder pain, left follow-up  [id number] Amy Dunn is a 74 y.o. female coming in with complaint of left shoulder pain. 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 Patient elected try conservative therapy in addition to a injection. Patient was to do home exercises, icing, topical anti-inflammatories as well as vitamin D supplementation. Patient states she is approximately 50% better. Still having some pain with certain range of motion as well as especially reaching behind her back. Patient states though that the pain at night is no longer there and no radicular symptoms. Patient denies any fevers or chills or any abnormal weight loss. Remains active. No new symptoms.  Past Medical History  Diagnosis Date  . History of colonic polyps   . Acute peptic ulcer, unspecified site, with hemorrhage and perforation, without mention of obstruction   . Osteoarthritis of left shoulder   . Primary osteoarthritis of left hip   . Pulmonary nodule, left     Left Lower lobe  . GERD (gastroesophageal reflux disease)   . Cough   . Hypertension   . TIA (transient ischemic attack) 2011    transient blindness, speech- resolved  . Stroke [facility])     no residual  . Glaucoma     right eye   Past Surgical History  Procedure Laterality Date  . Colon surgery    . Surgical repair of peptic ulcer    . Tubal ligation    . Eye surgery Bilateral     cataracts  . Video assisted thoracoscopy (vats)/wedge resection Left 01/29/2014    Procedure: VIDEO ASSISTED THORACOSCOPY (VATS)/BASILAR SEGMENTECTOMY  LEFT LUNG LOWER LOBE WITH FROZEN SECTION, LEFT LOWER LOBECTOMY;  Surgeon: Melrose Nakayama, MD;  Location: [facility];  Service:  Thoracic;  Laterality: Left;   Social History  Substance Use Topics  . Smoking status: Former Smoker -- 1 years    Types: Cigarettes    Quit date: 05/26/1981  . Smokeless tobacco: Never Used     Comment: 2 ciggs per day  . Alcohol Use: No   Allergies  Allergen Reactions  . Amlodipine Other (See Comments)    Trouble swallowing  . Penicillins Itching   Family History  Problem Relation Age of Onset  . Hypertension Mother   . Heart disease Mother   . Heart attack Mother   . Cancer Father     Colon cancer        Past medical history, social, surgical and family history all reviewed in electronic medical record.   Review of Systems: No headache, visual changes, nausea, vomiting, diarrhea, constipation, dizziness, abdominal pain, skin rash, fevers, chills, night sweats, weight loss, swollen lymph nodes, body aches, joint swelling, muscle aches, chest pain, shortness of breath, mood changes.   Objective Blood pressure 118/72, pulse 70, height '5\' 5"'$  (1.651 m), weight 130 lb (58.968 kg), SpO2 98 %.  General: No apparent distress alert and oriented x3 mood and affect normal, dressed appropriately.  HEENT: Pupils equal, extraocular movements intact  Respiratory: Patient's speak in full sentences and does not appear short of breath  Cardiovascular: No lower extremity edema, non tender, no erythema  Skin: Warm dry intact with no signs of infection or rash on extremities  or on axial skeleton.  Abdomen: Soft nontender  Neuro: Cranial nerves II through XII are intact, neurovascularly intact in all extremities with 2+ DTRs and 2+ pulses.  Lymph: No lymphadenopathy of posterior or anterior cervical chain or axillae bilaterally.  Gait normal with good balance and coordination.  MSK:  Non tender with full range of motion and good stability and symmetric strength and tone of shoulders, elbows, wrist, hip, knee and ankles bilaterally. Osteophytic changes of multiple joints Shoulder:  left Inspection reveals no abnormalities, atrophy or asymmetry. Palpation is normal with no tenderness over AC joint or bicipital groove. Increase range of motion to 120 of forefoot flexion and passively does have increasing range of motion internally and actually by 5 from previous exam. Rotator cuff strength 3+ out of 5 compared to 5 out of 5 on the contralateral side signs of impingement with positive Neer and [place]'s tests, but negative empty can sign. Speeds and Yergason's tests normal. Positive labral pathology. Normal scapular function observed. No painful arc and no drop arm sign. No apprehension sign  MSK US performed of: left This study was ordered, performed, and interpreted by Charlann Boxer D.O.  Shoulder:   Supraspinatus:  Continued tear noted but no retraction. Still proximal a 60% torn. Infraspinatus:  Appears normal on long and transverse views. Significant increase in Doppler flow Subscapularis: Degenerative changes noted with 50% tear Teres Minor:  Appears normal on long and transverse views. AC joint:  Mild to moderate arthritis Glenohumeral Joint:  Moderate osteophytic changes Glenoid Labrum: Tear on the posterior aspect of the posterior Abram noted Biceps Tendon:  Degenerative changes noted  Impression: mild healing noted. Still has tear of the supraspinatus.       Impression and Recommendations:     This case required medical decision making of moderate complexity.   ;l

## 2015-03-25 ENCOUNTER — Ambulatory Visit (INDEPENDENT_AMBULATORY_CARE_PROVIDER_SITE_OTHER): Payer: Medicare Other | Admitting: Pulmonary Disease

## 2015-03-25 ENCOUNTER — Encounter: Payer: Self-pay | Admitting: Pulmonary Disease

## 2015-03-25 VITALS — BP 120/80 | HR 60 | Ht 65.0 in | Wt 131.2 lb

## 2015-03-25 DIAGNOSIS — C3490 Malignant neoplasm of unspecified part of unspecified bronchus or lung: Secondary | ICD-10-CM

## 2015-03-25 DIAGNOSIS — R053 Chronic cough: Secondary | ICD-10-CM

## 2015-03-25 DIAGNOSIS — R05 Cough: Secondary | ICD-10-CM

## 2015-03-25 NOTE — Assessment & Plan Note (Signed)
-  cough may be related to sinus drip or reflux Take chlorpheniramine '8mg'$  CR daily x 3 weeks Take sudafed 60 mg XR daily x 3 weeks STOP allegra while on these meds Increase zegerid to twice daily x 3 weeks , then back down to once daily DELSYM cough syrup thrice daily as needed Call if no better

## 2015-03-25 NOTE — Patient Instructions (Signed)
Your cough may be related to sinus drip or reflux Take chlorpheniramine '8mg'$  CR daily x 3 weeks Take sudafed 60 mg XR daily x 3 weeks STOP allegra while on these meds Increase zegerid to twice daily x 3 weeks , then back down to once daily DELSYM cough syrup thrice daily as needed Call if no better

## 2015-03-25 NOTE — Progress Notes (Signed)
   Subjective:    Patient ID: Amy Dunn, female    DOB: 06-Jan-1941, 74 y.o.   MRN: [id number]  HPI PCP - [facility]   74/F, ex smoker for FU of lung CA & chronic cough since 07/2008 attributed to upper airway cough/ GERD . She underwent left lower lobe wedge resection in 12/2013 She smoked for 25 Pyrs before quitting in 1980.   LES was dilated - CT abdomen 3/13 showed -Air and fluid within dilated distal esophagus s/o  distal esophageal stricture    03/25/2015  Chief Complaint  Patient presents with  . Acute Visit    Coughing up white mucus, no chest tightness, no fever, PND, sinus headache   C/o cough x 2 weeks, frontal sinus headache,no fevers or URI at onset, oTC meds did not help, took tramadol for pain White sputum, no fever or change in color, no wheeze CT chest  08/2014 no recurrence  Significant tests/ events Head CT 10/2012 - sinuses clear, rt parietal calcified meningioma  01/29/2014 -stage IA (T18, N0, M0) non-small cell lung cancer, well-differentiated adenocarcinoma measuring 1.2 cm in size status post left lower lobe wedge resection with lymph node dissection. -Presented with hypermetabolic LLL nodule , first noted on CT feb 2011 ( not seen on CT abd feb 2006 ) -stable x2 y, then slight growth in 2014    07/2009 allergy panel nml.   Spirometry showed flattening of expiratory limb but no evidence of airway obstruction.  PFTs - no airway obstruction -mild restriction, TLC 80%, DLCO 70%   Review of Systems neg for any significant sore throat, dysphagia, itching, sneezing, nasal congestion or excess/ purulent secretions, fever, chills, sweats, unintended wt loss, pleuritic or exertional cp, hempoptysis, orthopnea pnd or change in chronic leg swelling. Also denies presyncope, palpitations, heartburn, abdominal pain, nausea, vomiting, diarrhea or change in bowel or urinary habits, dysuria,hematuria, rash, arthralgias, visual complaints, headache, numbness weakness or  ataxia.     Objective:   Physical Exam  Gen. Pleasant, well-nourished, in no distress ENT - no lesions, no post nasal drip Neck: No JVD, no thyromegaly, no carotid bruits Lungs: no use of accessory muscles, no dullness to percussion, clear without rales or rhonchi  Cardiovascular: Rhythm regular, heart sounds  normal, no murmurs or gallops, no peripheral edema Musculoskeletal: No deformities, no cyanosis or clubbing        Assessment & Plan:

## 2015-03-25 NOTE — Assessment & Plan Note (Signed)
SUrveillance CT planned

## 2015-03-27 ENCOUNTER — Other Ambulatory Visit: Payer: Medicare Other

## 2015-03-28 ENCOUNTER — Encounter (HOSPITAL_COMMUNITY): Payer: Self-pay

## 2015-03-28 ENCOUNTER — Other Ambulatory Visit (HOSPITAL_BASED_OUTPATIENT_CLINIC_OR_DEPARTMENT_OTHER): Payer: Medicare Other

## 2015-03-28 ENCOUNTER — Ambulatory Visit (HOSPITAL_COMMUNITY)
Admission: RE | Admit: 2015-03-28 | Discharge: 2015-03-28 | Disposition: A | Discharge status: Home / Self Care | Payer: Medicare Other | Source: Ambulatory Visit | Attending: Internal Medicine | Admitting: Internal Medicine

## 2015-03-28 DIAGNOSIS — Z08 Encounter for follow-up examination after completed treatment for malignant neoplasm: Secondary | ICD-10-CM | POA: Diagnosis not present

## 2015-03-28 DIAGNOSIS — R918 Other nonspecific abnormal finding of lung field: Secondary | ICD-10-CM | POA: Diagnosis not present

## 2015-03-28 DIAGNOSIS — C3492 Malignant neoplasm of unspecified part of left bronchus or lung: Secondary | ICD-10-CM | POA: Diagnosis not present

## 2015-03-28 DIAGNOSIS — K7689 Other specified diseases of liver: Secondary | ICD-10-CM | POA: Insufficient documentation

## 2015-03-28 DIAGNOSIS — Z9889 Other specified postprocedural states: Secondary | ICD-10-CM | POA: Insufficient documentation

## 2015-03-28 DIAGNOSIS — N281 Cyst of kidney, acquired: Secondary | ICD-10-CM | POA: Diagnosis not present

## 2015-03-28 DIAGNOSIS — J90 Pleural effusion, not elsewhere classified: Secondary | ICD-10-CM | POA: Diagnosis not present

## 2015-03-28 HISTORY — DX: Malignant neoplasm of unspecified part of left bronchus or lung: C34.92

## 2015-03-28 LAB — COMPREHENSIVE METABOLIC PANEL (CC13)
ANION GAP: 8 meq/L (ref 3–11)
AST: 13 U/L (ref 5–34)
Albumin: 3.8 g/dL (ref 3.5–5.0)
Alkaline Phosphatase: 95 U/L (ref 40–150)
BUN: 14.7 mg/dL (ref 7.0–26.0)
CALCIUM: 9.8 mg/dL (ref 8.4–10.4)
CHLORIDE: 102 meq/L (ref 98–109)
CO2: 30 mEq/L — ABNORMAL HIGH (ref 22–29)
CREATININE: 1.2 mg/dL — AB (ref 0.6–1.1)
EGFR: 52 mL/min/{1.73_m2} — ABNORMAL LOW (ref >90)
Glucose: 90 mg/dl (ref 70–140)
POTASSIUM: 4.1 meq/L (ref 3.5–5.1)
Sodium: 140 mEq/L (ref 136–145)
Total Bilirubin: 0.53 mg/dL (ref 0.20–1.20)
Total Protein: 7.6 g/dL (ref 6.4–8.3)

## 2015-03-28 LAB — CBC WITH DIFFERENTIAL/PLATELET
BASO%: 0.5 % (ref 0.0–2.0)
Basophils Absolute: 0 10*3/uL (ref 0.0–0.1)
EOS ABS: 0.1 10*3/uL (ref 0.0–0.5)
EOS%: 1.6 % (ref 0.0–7.0)
HEMATOCRIT: 33.3 % — AB (ref 34.8–46.6)
HEMOGLOBIN: 11.6 g/dL (ref 11.6–15.9)
LYMPH%: 25.5 % (ref 14.0–49.7)
MCH: 27.4 pg (ref 25.1–34.0)
MCHC: 34.8 g/dL (ref 31.5–36.0)
MCV: 78.7 fL — AB (ref 79.5–101.0)
MONO#: 0.8 10*3/uL (ref 0.1–0.9)
MONO%: 9.5 % (ref 0.0–14.0)
NEUT%: 62.9 % (ref 38.4–76.8)
NEUTROS ABS: 5.2 10*3/uL (ref 1.5–6.5)
PLATELETS: 200 10*3/uL (ref 145–400)
RBC: 4.23 10*6/uL (ref 3.70–5.45)
RDW: 14.6 % — AB (ref 11.2–14.5)
WBC: 8.3 10*3/uL (ref 3.9–10.3)
lymph#: 2.1 10*3/uL (ref 0.9–3.3)

## 2015-03-28 MED ORDER — IOHEXOL 300 MG/ML  SOLN
75.0000 mL | Freq: Once | INTRAMUSCULAR | Status: AC | PRN
  Administered 2015-03-28: 75 mL via INTRAVENOUS

## 2015-04-01 ENCOUNTER — Ambulatory Visit (INDEPENDENT_AMBULATORY_CARE_PROVIDER_SITE_OTHER): Payer: Medicare Other | Admitting: Internal Medicine

## 2015-04-01 ENCOUNTER — Other Ambulatory Visit (INDEPENDENT_AMBULATORY_CARE_PROVIDER_SITE_OTHER): Payer: Medicare Other

## 2015-04-01 ENCOUNTER — Encounter: Payer: Self-pay | Admitting: Internal Medicine

## 2015-04-01 VITALS — BP 136/76 | HR 66 | Temp 98.1°F | Resp 12 | Ht 65.0 in | Wt 132.8 lb

## 2015-04-01 DIAGNOSIS — Z13220 Encounter for screening for lipoid disorders: Secondary | ICD-10-CM

## 2015-04-01 DIAGNOSIS — M25512 Pain in left shoulder: Secondary | ICD-10-CM | POA: Diagnosis not present

## 2015-04-01 DIAGNOSIS — Z23 Encounter for immunization: Secondary | ICD-10-CM

## 2015-04-01 DIAGNOSIS — R0789 Other chest pain: Secondary | ICD-10-CM

## 2015-04-01 DIAGNOSIS — I10 Essential (primary) hypertension: Secondary | ICD-10-CM

## 2015-04-01 LAB — LIPID PANEL
CHOLESTEROL: 212 mg/dL — AB (ref 0–200)
HDL: 47.9 mg/dL (ref >39.00)
LDL Cholesterol: 125 mg/dL — ABNORMAL HIGH (ref 0–99)
NONHDL: 164.42
Total CHOL/HDL Ratio: 4
Triglycerides: 199 mg/dL — ABNORMAL HIGH (ref 0.0–149.0)
VLDL: 39.8 mg/dL (ref 0.0–40.0)

## 2015-04-01 MED ORDER — TRAMADOL HCL 50 MG PO TABS: 50.0000 mg | ORAL_TABLET | Freq: Four times a day (QID) | ORAL | Status: DC | PRN

## 2015-04-01 NOTE — Patient Instructions (Addendum)
We have given you the pneumonia booster shot today, we are checking your cholesterol.   We will send a note to your surgeon to make sure that the fluid is normal and not a possible cause of the pain.   Come back in about 6 months.   Exercising to Stay Healthy Exercising regularly is important. It has many health benefits, such as:  Improving your overall fitness, flexibility, and endurance.  Increasing your bone density.  Helping with weight control.  Decreasing your body fat.  Increasing your muscle strength.  Reducing stress and tension.  Improving your overall health. In order to become healthy and stay healthy, it is recommended that you do moderate-intensity and vigorous-intensity exercise. You can tell that you are exercising at a moderate intensity if you have a higher heart rate and faster breathing, but you are still able to hold a conversation. You can tell that you are exercising at a vigorous intensity if you are breathing much harder and faster and cannot hold a conversation while exercising. HOW OFTEN SHOULD I EXERCISE? Choose an activity that you enjoy and set realistic goals. Your health care provider can help you to make an activity plan that works for you. Exercise regularly as directed by your health care provider. This may include:   Doing resistance training twice each week, such as:  Push-ups.  Sit-ups.  Lifting weights.  Using resistance bands.  Doing a given intensity of exercise for a given amount of time. Choose from these options:  150 minutes of moderate-intensity exercise every week.  75 minutes of vigorous-intensity exercise every week.  A mix of moderate-intensity and vigorous-intensity exercise every week. Children, pregnant women, people who are out of shape, people who are overweight, and older adults may need to consult a health care provider for individual recommendations. If you have any sort of medical condition, be sure to consult your  health care provider before starting a new exercise program.  WHAT ARE SOME EXERCISE IDEAS? Some moderate-intensity exercise ideas include:   Walking at a rate of 1 mile in 15 minutes.  Biking.  Hiking.  Golfing.  Dancing. Some vigorous-intensity exercise ideas include:   Walking at a rate of at least 4.5 miles per hour.  Jogging or running at a rate of 5 miles per hour.  Biking at a rate of at least 10 miles per hour.  Lap swimming.  Roller-skating or in-line skating.  Cross-country skiing.  Vigorous competitive sports, such as football, basketball, and soccer.  Jumping rope.  Aerobic dancing. WHAT ARE SOME EVERYDAY ACTIVITIES THAT CAN HELP ME TO GET EXERCISE?  Yard work, such as:  Psychologist, educational.  Raking and bagging leaves.  Washing and waxing your car.  Pushing a stroller.  Shoveling snow.  Gardening.  Washing windows or floors. HOW CAN I BE MORE ACTIVE IN MY DAY-TO-DAY ACTIVITIES?  Use the stairs instead of the elevator.  Take a walk during your lunch break.  If you drive, park your car farther away from work or school.  If you take public transportation, get off one stop early and walk the rest of the way.  Make all of your phone calls while standing up and walking around.  Get up, stretch, and walk around every 30 minutes throughout the day. WHAT GUIDELINES SHOULD I FOLLOW WHILE EXERCISING?  Do not exercise so much that you hurt yourself, feel dizzy, or get very short of breath.  Consult your health care provider before starting a new exercise  program.  Wear comfortable clothes and shoes with good support.  Drink plenty of water while you exercise to prevent dehydration or heat stroke. Body water is lost during exercise and must be replaced.  Work out until you breathe faster and your heart beats faster.   This information is not intended to replace advice given to you by your health care provider. Make sure you discuss any  questions you have with your health care provider.   Document Released: 06/20/2010 Document Revised: 06/08/2014 Document Reviewed: 10/19/2013 Elsevier Interactive Patient Education [employer].

## 2015-04-01 NOTE — Assessment & Plan Note (Signed)
Rx for tramadol refilled and she will continue to follow with sports medicine.

## 2015-04-01 NOTE — Progress Notes (Signed)
   Subjective:    Patient ID: Amy Dunn, female    DOB: November 27, 1940, 74 y.o.   MRN: [id number]  HPI  Here for follow up of her medical problems. She is still having a lot of shoulder pain. She is working with sports medicine and doing exercises at home. It is not healing well and she goes back in a couple of weeks to see if she may need surgery to help fix it. She is also still having some soreness of the left side (prior lung surgery about 1 year ago). She was told to take some alleve for it by the surgeon which she has been trying but this has not helped much. She had recent CT scan looking for recurrence (reviewed with her at visit, showed no recurrence but still some loculated fluid at the surgical site). She denies cough, fevers, chills. She is not exercising much except the stretches for the shoulder. This pain limits her activity.   Review of Systems  Constitutional: Negative.   Respiratory: Negative for cough, chest tightness, shortness of breath and wheezing.   Cardiovascular: Negative for chest pain, palpitations and leg swelling.  Gastrointestinal: Negative for diarrhea, constipation and abdominal distention.  Musculoskeletal: Positive for myalgias.       Arm swelling  Skin: Negative.   Neurological: Negative.   Psychiatric/Behavioral: Negative.       Objective:   Physical Exam  Constitutional: She is oriented to person, place, and time. She appears well-developed and well-nourished.  HENT:  Head: Normocephalic and atraumatic.  Eyes: EOM are normal.  Neck: Normal range of motion.  Cardiovascular: Normal rate and regular rhythm.   Pulmonary/Chest: Effort normal and breath sounds normal. No respiratory distress. She has no wheezes. She has no rales.  Sore to touch over the left chest wall on the side, worse with twisting or bending  Abdominal: Soft.  Neurological: She is alert and oriented to person, place, and time.  Skin: Skin is warm and dry.   Filed Vitals:   04/01/15  0803  BP: 136/76  Pulse: 66  Temp: 98.1 F (36.7 C)  TempSrc: Oral  Resp: 12  Height: '5\' 5"'$  (1.651 m)  Weight: 132 lb 12.8 oz (60.238 kg)  SpO2: 98%      Assessment & Plan:  Prevnar 13 given at visit.

## 2015-04-01 NOTE — Progress Notes (Signed)
Pre visit review using our clinic review tool, if applicable. No additional management support is needed unless otherwise documented below in the visit note. 

## 2015-04-01 NOTE — Assessment & Plan Note (Signed)
Concern that this could be related to the loculated fluid at prior surgical site. She has tried [employer] which has not helped. Will advise heating pad to see if this is helpful. Will ask her CT surgeon to check CT scan and make sure that the fluid pocket is typical and not related to her pain.

## 2015-04-01 NOTE — Assessment & Plan Note (Signed)
BP at goal on atenolol, losartan/hctz. Reviewed recent CMP and no indication for change. Not complicated.

## 2015-04-03 ENCOUNTER — Ambulatory Visit: Payer: Medicare Other | Admitting: Internal Medicine

## 2015-04-09 ENCOUNTER — Telehealth: Payer: Self-pay | Admitting: Internal Medicine

## 2015-04-09 ENCOUNTER — Encounter: Payer: Self-pay | Admitting: Internal Medicine

## 2015-04-09 ENCOUNTER — Ambulatory Visit (INDEPENDENT_AMBULATORY_CARE_PROVIDER_SITE_OTHER): Payer: Medicare Other | Admitting: Thoracic Surgery (Cardiothoracic Vascular Surgery)

## 2015-04-09 ENCOUNTER — Encounter: Payer: Self-pay | Admitting: Thoracic Surgery (Cardiothoracic Vascular Surgery)

## 2015-04-09 ENCOUNTER — Ambulatory Visit (HOSPITAL_BASED_OUTPATIENT_CLINIC_OR_DEPARTMENT_OTHER): Payer: Medicare Other | Admitting: Internal Medicine

## 2015-04-09 VITALS — BP 133/80 | HR 60 | Resp 20 | Ht 65.0 in | Wt 130.0 lb

## 2015-04-09 VITALS — BP 129/68 | HR 62 | Temp 97.7°F | Resp 18 | Ht 65.0 in | Wt 130.7 lb

## 2015-04-09 DIAGNOSIS — C3492 Malignant neoplasm of unspecified part of left bronchus or lung: Secondary | ICD-10-CM

## 2015-04-09 DIAGNOSIS — C3432 Malignant neoplasm of lower lobe, left bronchus or lung: Secondary | ICD-10-CM

## 2015-04-09 DIAGNOSIS — Z85118 Personal history of other malignant neoplasm of bronchus and lung: Secondary | ICD-10-CM | POA: Diagnosis not present

## 2015-04-09 DIAGNOSIS — Z902 Acquired absence of lung [part of]: Secondary | ICD-10-CM | POA: Diagnosis not present

## 2015-04-09 NOTE — Progress Notes (Signed)
[address]       [place] [address]             [phone]       HPI: Mrs. Ziebarth returns today for a scheduled follow-up visit.  She is a 74 year old woman who had a left lower lobe basilar segmentectomy for stage IA non-small cell carcinoma in August 2015. I last saw her in the office in May. At that time she was doing well with no evidence recurrent disease. She did still have some incisional pain for which she was using tramadol.  Since that visit she's been doing well. She developed a cough a couple of weeks ago and saw Dr. Elsworth Soho about that. That has subsequently resolved. She says her appetite is good. Her weight is stable. She has not had any recent exacerbations of COPD. She does still have some incisional pain and does still occasionally take tramadol. She denies any unusual headaches or visual changes.  Past Medical History  Diagnosis Date  . History of colonic polyps   . Acute peptic ulcer, unspecified site, with hemorrhage and perforation, without mention of obstruction   . Osteoarthritis of left shoulder   . Primary osteoarthritis of left hip   . Pulmonary nodule, left     Left Lower lobe  . GERD (gastroesophageal reflux disease)   . Cough   . Hypertension   . TIA (transient ischemic attack) 2011    transient blindness, speech- resolved  . Stroke [facility])     no residual  . Glaucoma     right eye  . Cancer of left lung ([place]) 2015      Current Outpatient Prescriptions  Medication Sig Dispense Refill  . atenolol (TENORMIN) 50 MG tablet take 1 tablet by mouth once daily 30 tablet 6  . dorzolamide-timolol (COSOPT) 22.3-6.8 MG/ML ophthalmic solution Place 1 drop into the left eye daily.     . fexofenadine (ALLEGRA) 180 MG tablet Take 180 mg by mouth daily as needed. For allergies    . fluticasone (FLONASE) 50 MCG/ACT nasal spray Place 2 sprays into both nostrils daily as needed for allergies or rhinitis.    Marland Kitchen losartan-hydrochlorothiazide  (HYZAAR) 100-25 MG per tablet Take 1 tablet by mouth daily. 90 tablet 3  . Omeprazole-Sodium Bicarbonate (ZEGERID OTC) 20-1100 MG CAPS capsule Take 1 capsule by mouth daily before breakfast.    . traMADol (ULTRAM) 50 MG tablet Take 1 tablet (50 mg total) by mouth every 6 (six) hours as needed for moderate pain. 50 tablet 0   No current facility-administered medications for this visit.    Physical Exam BP 133/80 mmHg  Pulse 60  Resp 20  Ht '5\' 5"'$  (1.651 m)  Wt 130 lb (58.968 kg)  BMI 21.63 kg/m2  SpO24 21% 74 year old woman in no acute distress Well-developed and well-nourished Alert and oriented 3 with no focal motor deficits No cervical or supraclavicular adenopathy Cardiac regular rate and rhythm normal S1 and S2 Lungs slightly diminished at left base, otherwise clear Incisions well healed  Diagnostic Tests: CT CHEST WITH CONTRAST  TECHNIQUE: Multidetector CT imaging of the chest was performed during intravenous contrast administration.  CONTRAST: 6m OMNIPAQUE IOHEXOL 300 MG/ML SOLN  COMPARISON: PET-CT 11/29/2013. Chest CT 09/26/2014.  FINDINGS: Mediastinum/Nodes: There are no enlarged mediastinal, hilar or axillary lymph nodes. The thyroid gland, trachea and esophagus demonstrate no significant findings. The heart size is normal. There is no pericardial effusion. There are no significant vascular findings.  Lungs/Pleura: There is small amount of loculated pleural fluid on the left appears unchanged. There are stable postsurgical changes consistent with wedge resection in the left lower lobe. There is a small amount of scarring along the suture line and adjacent fissural thickening which appears unchanged. No evidence of local recurrence. There are no suspicious pulmonary nodules.  Upper abdomen: Several hepatic and bilateral renal cysts are again noted. The gallbladder is present. The chronic biliary dilatation appears mildly progressive with the common  hepatic duct measuring up to 11 mm in diameter. Slight prominence of the pancreatic duct appears unchanged. No evidence of adrenal mass.  Musculoskeletal/Chest wall: There is no chest wall mass or suspicious osseous finding.  IMPRESSION: 1. Stable postoperative appearance of the chest status post left lower lobe wedge resection. No evidence of local recurrence or metastatic disease. 2. A small amount of pleural thickening and loculated pleural fluid on the left appears unchanged. 3. Chronic biliary dilatation appears minimally progressive. Multiple hepatic and renal cysts are unchanged.   Electronically Signed  By: Richardean Sale M.D.  On: 03/28/2015 12:21  I personally reviewed the CT scan and concur with findings as noted above  Impression: 74 year old woman who is now a little over a year out from a left lower lobe basilar segmentectomy for stage IA non-small cell carcinoma. She has no evidence of recurrent disease. Overall she is doing very well.  She will see Dr. Julien Nordmann again in 6 months.  Plan: I will plan to see her back in one year after her CT chest for her 2 year follow-up.    Melrose Nakayama, MD [facility] [phone]

## 2015-04-09 NOTE — Telephone Encounter (Signed)
no vm....mailed pt appt sched/letter and avs

## 2015-04-09 NOTE — Progress Notes (Signed)
[place] Telephone:([phone]) [phone]   Fax:([phone]  OFFICE PROGRESS NOTE  [name], MD [place] [address]  DIAGNOSIS: Stage IA (T1a, N0, M0) non-small cell lung cancer, well-differentiated adenocarcinoma diagnosed in July 2015.  PRIOR THERAPY: Status post left VATS with left lower lobe wedge resection as well as left basilar segmentectomy and lymph node dissection under the care of Dr. Roxan Hockey on 01/29/2014.  CURRENT THERAPY: Observation.  INTERVAL HISTORY: Amy Dunn 74 y.o. female returns to the clinic today for six-month follow-up visit. The patient is feeling fine today with no specific complaints except for persistent soreness on the left side of her chest after the surgical resection. She was seen recently by her primary care physician and was given pain medication. She denied having any significant shortness of breath except with exertion. She has no cough or hemoptysis. The patient denied having any significant weight loss or night sweats. She has no fever or chills. He has no nausea or vomiting. She had repeat CT scan of the chest performed recently and she is here for evaluation and discussion of her scan results.  MEDICAL HISTORY: Past Medical History  Diagnosis Date  . History of colonic polyps   . Acute peptic ulcer, unspecified site, with hemorrhage and perforation, without mention of obstruction   . Osteoarthritis of left shoulder   . Primary osteoarthritis of left hip   . Pulmonary nodule, left     Left Lower lobe  . GERD (gastroesophageal reflux disease)   . Cough   . Hypertension   . TIA (transient ischemic attack) 2011    transient blindness, speech- resolved  . Stroke [facility])     no residual  . Glaucoma     right eye  . Cancer of left lung ([place]) 2015    ALLERGIES:  is allergic to amlodipine and penicillins.  MEDICATIONS:  Current Outpatient Prescriptions  Medication Sig Dispense Refill  .  atenolol (TENORMIN) 50 MG tablet take 1 tablet by mouth once daily 30 tablet 6  . dorzolamide-timolol (COSOPT) 22.3-6.8 MG/ML ophthalmic solution Place 1 drop into the left eye daily.     . fexofenadine (ALLEGRA) 180 MG tablet Take 180 mg by mouth daily as needed. For allergies    . fluticasone (FLONASE) 50 MCG/ACT nasal spray Place 2 sprays into both nostrils daily as needed for allergies or rhinitis.    Marland Kitchen losartan-hydrochlorothiazide (HYZAAR) 100-25 MG per tablet Take 1 tablet by mouth daily. 90 tablet 3  . Omeprazole-Sodium Bicarbonate (ZEGERID OTC) 20-1100 MG CAPS capsule Take 1 capsule by mouth daily before breakfast.    . traMADol (ULTRAM) 50 MG tablet Take 1 tablet (50 mg total) by mouth every 6 (six) hours as needed for moderate pain. 50 tablet 0   No current facility-administered medications for this visit.    SURGICAL HISTORY:  Past Surgical History  Procedure Laterality Date  . Colon surgery    . Surgical repair of peptic ulcer    . Tubal ligation    . Eye surgery Bilateral     cataracts  . Video assisted thoracoscopy (vats)/wedge resection Left 01/29/2014    Procedure: VIDEO ASSISTED THORACOSCOPY (VATS)/BASILAR SEGMENTECTOMY  LEFT LUNG LOWER LOBE WITH FROZEN SECTION, LEFT LOWER LOBECTOMY;  Surgeon: Melrose Nakayama, MD;  Location: [facility];  Service: Thoracic;  Laterality: Left;    REVIEW OF SYSTEMS:  A comprehensive review of systems was negative except for: Respiratory: positive for pleurisy/chest pain  PHYSICAL EXAMINATION: General appearance: alert, cooperative and no distress Head: Normocephalic, without obvious abnormality, atraumatic Neck: no adenopathy, no JVD, supple, symmetrical, trachea midline and thyroid not enlarged, symmetric, no tenderness/mass/nodules Lymph nodes: Cervical, supraclavicular, and axillary nodes normal. Resp: clear to auscultation bilaterally Back: symmetric, no curvature. ROM normal. No CVA tenderness. Cardio: regular rate and rhythm, S1, S2  normal, no murmur, click, rub or gallop GI: soft, non-tender; bowel sounds normal; no masses,  no organomegaly Extremities: extremities normal, atraumatic, no cyanosis or edema  ECOG PERFORMANCE STATUS: 1 - Symptomatic but completely ambulatory  Blood pressure 129/68, pulse 62, temperature 97.7 F (36.5 C), temperature source Oral, resp. rate 18, height '5\' 5"'$  (1.651 m), weight 130 lb 11.2 oz (59.285 kg), SpO2 100 %.  LABORATORY DATA: Lab Results  Component Value Date   WBC 8.3 03/28/2015   HGB 11.6 03/28/2015   HCT 33.3* 03/28/2015   MCV 78.7* 03/28/2015   PLT 200 03/28/2015      Chemistry      Component Value Date/Time   NA 140 03/28/2015 1001   NA 136* 02/02/2014 0304   K 4.1 03/28/2015 1001   K 4.1 02/02/2014 0304   CL 100 02/02/2014 0304   CO2 30* 03/28/2015 1001   CO2 24 02/02/2014 0304   BUN 14.7 03/28/2015 1001   BUN 18 02/02/2014 0304   CREATININE 1.2* 03/28/2015 1001   CREATININE 1.29* 02/02/2014 0304      Component Value Date/Time   CALCIUM 9.8 03/28/2015 1001   CALCIUM 8.2* 02/02/2014 0304   ALKPHOS 95 03/28/2015 1001   ALKPHOS 91 01/31/2014 0400   AST 13 03/28/2015 1001   AST 27 01/31/2014 0400   ALT <9 Repeated and Verified 03/28/2015 1001   ALT 16 01/31/2014 0400   BILITOT 0.53 03/28/2015 1001   BILITOT 0.3 01/31/2014 0400       RADIOGRAPHIC STUDIES: Ct Chest W Contrast  03/28/2015  CLINICAL DATA:  Restaging left lung cancer diagnosed in July 2015. Previous partial lung resection with subsequent adjuvant therapy. EXAM: CT CHEST WITH CONTRAST TECHNIQUE: Multidetector CT imaging of the chest was performed during intravenous contrast administration. CONTRAST:  71m OMNIPAQUE IOHEXOL 300 MG/ML  SOLN COMPARISON:  PET-CT 11/29/2013.  Chest CT 09/26/2014. FINDINGS: Mediastinum/Nodes: There are no enlarged mediastinal, hilar or axillary lymph nodes. The thyroid gland, trachea and esophagus demonstrate no significant findings. The heart size is normal. There  is no pericardial effusion. There are no significant vascular findings. Lungs/Pleura: There is small amount of loculated pleural fluid on the left appears unchanged. There are stable postsurgical changes consistent with wedge resection in the left lower lobe. There is a small amount of scarring along the suture line and adjacent fissural thickening which appears unchanged. No evidence of local recurrence. There are no suspicious pulmonary nodules. Upper abdomen: Several hepatic and bilateral renal cysts are again noted. The gallbladder is present. The chronic biliary dilatation appears mildly progressive with the common hepatic duct measuring up to 11 mm in diameter. Slight prominence of the pancreatic duct appears unchanged. No evidence of adrenal mass. Musculoskeletal/Chest wall: There is no chest wall mass or suspicious osseous finding. IMPRESSION: 1. Stable postoperative appearance of the chest status post left lower lobe wedge resection. No evidence of local recurrence or metastatic disease. 2. A small amount of pleural thickening and loculated pleural fluid on the left appears unchanged. 3. Chronic biliary dilatation appears minimally progressive. Multiple hepatic and renal cysts are unchanged. Electronically Signed   By: WCaryl ComesD.  On: 03/28/2015 12:21   [place] Extrem Up Left Ltd  03/26/2015  [facility] US performed of: left This study was ordered, performed, and interpreted by Charlann Boxer D.O. Shoulder:  Supraspinatus: Continued tear noted but no retraction. Still proximal a 60% torn. Infraspinatus: Appears normal on long and transverse views. Significant increase in Doppler flow Subscapularis: Degenerative changes noted with 50% tear Teres Minor: Appears normal on long and transverse views. AC joint: Mild to moderate arthritis Glenohumeral Joint: Moderate osteophytic changes Glenoid Labrum: Tear on the posterior aspect of the posterior Abram noted Biceps Tendon: Degenerative changes noted  Impression: mild healing noted. Still has tear of the supraspinatus.   ASSESSMENT AND PLAN: This is a very pleasant 74 years old [place] American female recently diagnosed with a stage IA non-small cell lung cancer status post left lower lobe wedge resection and currently on observation.  The recent CT scan of the chest showed no evidence for disease recurrence.  I discussed the scan results with the patient and recommended for her to continue on observation. I would see her back for follow-up visit in 6 months with repeat CT scan of the chest. She was advised to call immediately if she has any concerning symptoms in the interval. The patient voices understanding of current disease status and treatment options and is in agreement with the current care plan.  All questions were answered. The patient knows to call the clinic with any problems, questions or concerns. We can certainly see the patient much sooner if necessary.  Disclaimer: This note was dictated with voice recognition software. Similar sounding words can inadvertently be transcribed and may not be corrected upon review.

## 2015-04-30 ENCOUNTER — Ambulatory Visit (INDEPENDENT_AMBULATORY_CARE_PROVIDER_SITE_OTHER): Payer: Medicare Other | Admitting: Ophthalmology

## 2015-04-30 DIAGNOSIS — H35033 Hypertensive retinopathy, bilateral: Secondary | ICD-10-CM

## 2015-04-30 DIAGNOSIS — I10 Essential (primary) hypertension: Secondary | ICD-10-CM | POA: Diagnosis not present

## 2015-04-30 DIAGNOSIS — H43813 Vitreous degeneration, bilateral: Secondary | ICD-10-CM | POA: Diagnosis not present

## 2015-04-30 DIAGNOSIS — H348312 Tributary (branch) retinal vein occlusion, right eye, stable: Secondary | ICD-10-CM | POA: Diagnosis not present

## 2015-05-02 ENCOUNTER — Ambulatory Visit: Payer: Medicare Other | Admitting: Family Medicine

## 2015-05-24 ENCOUNTER — Telehealth: Payer: Self-pay | Admitting: *Deleted

## 2015-05-24 MED ORDER — TRAMADOL HCL 50 MG PO TABS: 50.0000 mg | ORAL_TABLET | Freq: Four times a day (QID) | ORAL | Status: DC | PRN

## 2015-05-24 NOTE — Telephone Encounter (Signed)
Received call pt is requesting refill on her Tramadol. MD out of office. Pls advise...Johny Chess

## 2015-05-24 NOTE — Telephone Encounter (Signed)
Notified pt rx fax to pharmacy...Amy Dunn

## 2015-05-24 NOTE — Telephone Encounter (Signed)
Done hardcopy to Dahlia  

## 2015-07-11 ENCOUNTER — Ambulatory Visit: Payer: Medicare Other | Admitting: Internal Medicine

## 2015-07-17 ENCOUNTER — Other Ambulatory Visit (INDEPENDENT_AMBULATORY_CARE_PROVIDER_SITE_OTHER): Payer: Medicare HMO

## 2015-07-17 ENCOUNTER — Encounter: Payer: Self-pay | Admitting: Internal Medicine

## 2015-07-17 ENCOUNTER — Ambulatory Visit (INDEPENDENT_AMBULATORY_CARE_PROVIDER_SITE_OTHER): Payer: Medicare HMO | Admitting: Internal Medicine

## 2015-07-17 VITALS — BP 130/70 | HR 69 | Temp 98.3°F | Resp 16 | Ht 65.0 in | Wt 134.0 lb

## 2015-07-17 DIAGNOSIS — K30 Functional dyspepsia: Secondary | ICD-10-CM

## 2015-07-17 DIAGNOSIS — R109 Unspecified abdominal pain: Secondary | ICD-10-CM

## 2015-07-17 DIAGNOSIS — N950 Postmenopausal bleeding: Secondary | ICD-10-CM

## 2015-07-17 DIAGNOSIS — R1032 Left lower quadrant pain: Secondary | ICD-10-CM

## 2015-07-17 LAB — CBC
HCT: 36.1 % (ref 36.0–46.0)
HEMOGLOBIN: 12.4 g/dL (ref 12.0–15.0)
MCHC: 34.2 g/dL (ref 30.0–36.0)
MCV: 80.6 fl (ref 78.0–100.0)
PLATELETS: 212 10*3/uL (ref 150.0–400.0)
RBC: 4.48 Mil/uL (ref 3.87–5.11)
RDW: 14.9 % (ref 11.5–15.5)
WBC: 9 10*3/uL (ref 4.0–10.5)

## 2015-07-17 LAB — COMPREHENSIVE METABOLIC PANEL
ALBUMIN: 4.3 g/dL (ref 3.5–5.2)
ALK PHOS: 89 U/L (ref 39–117)
ALT: 8 U/L (ref 0–35)
AST: 16 U/L (ref 0–37)
BILIRUBIN TOTAL: 0.4 mg/dL (ref 0.2–1.2)
BUN: 15 mg/dL (ref 6–23)
CALCIUM: 9.5 mg/dL (ref 8.4–10.5)
CO2: 31 mEq/L (ref 19–32)
CREATININE: 1.21 mg/dL — AB (ref 0.40–1.20)
Chloride: 99 mEq/L (ref 96–112)
GFR: 55.52 mL/min — ABNORMAL LOW (ref >60.00)
Glucose, Bld: 102 mg/dL — ABNORMAL HIGH (ref 70–99)
Potassium: 4.3 mEq/L (ref 3.5–5.1)
SODIUM: 139 meq/L (ref 135–145)
TOTAL PROTEIN: 8 g/dL (ref 6.0–8.3)

## 2015-07-17 LAB — LIPASE: Lipase: 42 U/L (ref 11.0–59.0)

## 2015-07-17 MED ORDER — TRAMADOL HCL 50 MG PO TABS: 50.0000 mg | ORAL_TABLET | Freq: Four times a day (QID) | ORAL | Status: DC | PRN

## 2015-07-17 MED ORDER — METRONIDAZOLE 500 MG PO TABS: 500.0000 mg | ORAL_TABLET | Freq: Three times a day (TID) | ORAL | Status: DC

## 2015-07-17 MED ORDER — CIPROFLOXACIN HCL 500 MG PO TABS: 500.0000 mg | ORAL_TABLET | Freq: Two times a day (BID) | ORAL | Status: DC

## 2015-07-17 NOTE — Progress Notes (Signed)
   Subjective:    Patient ID: Amy Dunn, female    DOB: May 27, 1941, 75 y.o.   MRN: [id number]  HPI The patient is a 75 YO female coming in for left lower abdominal pain. Does radiate some into her side. Also diarrhea since onset of pain. She is taking imodium for it which is helping some. Nothing has helped the pain so far. Going on about 2 weeks. No fevers or chills. No change in diet. No sick exposures. She has also had about 1 days of vaginal bleeding which is not typical. It stopped on its own. She had not had postmenopausal bleeding before. Denies blood in her stool. Pain is 7/10. No nausea and still eating and drinking well.   Review of Systems  Constitutional: Negative.   Respiratory: Negative for cough, chest tightness, shortness of breath and wheezing.   Cardiovascular: Negative for chest pain, palpitations and leg swelling.  Gastrointestinal: Positive for abdominal pain and diarrhea. Negative for nausea, vomiting, constipation, blood in stool and abdominal distention.  Musculoskeletal: Negative.   Skin: Negative.   Neurological: Negative.   Psychiatric/Behavioral: Negative.       Objective:   Physical Exam  Constitutional: She is oriented to person, place, and time. She appears well-developed and well-nourished.  HENT:  Head: Normocephalic and atraumatic.  Eyes: EOM are normal.  Neck: Normal range of motion.  Cardiovascular: Normal rate and regular rhythm.   Pulmonary/Chest: Effort normal and breath sounds normal. No respiratory distress. She has no wheezes. She has no rales.  Sore to touch over the left chest wall on the side, stable  Abdominal: Soft. Bowel sounds are normal. She exhibits no distension and no mass. There is tenderness. There is no rebound and no guarding.  Pain in the LLQ, not radiating and no rebound or guarding  Neurological: She is alert and oriented to person, place, and time.  Skin: Skin is warm and dry.   Filed Vitals:   07/17/15 0843  BP: 130/70   Pulse: 69  Temp: 98.3 F (36.8 C)  TempSrc: Oral  Resp: 16  Height: '5\' 5"'$  (1.651 m)  Weight: 134 lb (60.782 kg)  SpO2: 98%      Assessment & Plan:

## 2015-07-17 NOTE — Patient Instructions (Signed)
We will check the labs today and call you back with the results.   We have sent in ciprofloxacin for the pain that you take 1 pill twice a day. We have also sent in an antibiotic called flagyl that you take 1 pill three times per day.   We have sent in 1 week of antibiotics that should help with the pain. If you are not feeling better on Friday call the office and we will check a scan of the stomach to make sure we have the right diagnosis.   Diverticulitis Diverticulitis is inflammation or infection of small pouches in your colon that form when you have a condition called diverticulosis. The pouches in your colon are called diverticula. Your colon, or large intestine, is where water is absorbed and stool is formed. Complications of diverticulitis can include:  Bleeding.  Severe infection.  Severe pain.  Perforation of your colon.  Obstruction of your colon. CAUSES  Diverticulitis is caused by bacteria. Diverticulitis happens when stool becomes trapped in diverticula. This allows bacteria to grow in the diverticula, which can lead to inflammation and infection. RISK FACTORS People with diverticulosis are at risk for diverticulitis. Eating a diet that does not include enough fiber from fruits and vegetables may make diverticulitis more likely to develop. SYMPTOMS  Symptoms of diverticulitis may include:  Abdominal pain and tenderness. The pain is normally located on the left side of the abdomen, but may occur in other areas.  Fever and chills.  Bloating.  Cramping.  Nausea.  Vomiting.  Constipation.  Diarrhea.  Blood in your stool. DIAGNOSIS  Your health care provider will ask you about your medical history and do a physical exam. You may need to have tests done because many medical conditions can cause the same symptoms as diverticulitis. Tests may include:  Blood tests.  Urine tests.  Imaging tests of the abdomen, including X-rays and CT scans. When your condition  is under control, your health care provider may recommend that you have a colonoscopy. A colonoscopy can show how severe your diverticula are and whether something else is causing your symptoms. TREATMENT  Most cases of diverticulitis are mild and can be treated at home. Treatment may include:  Taking over-the-counter pain medicines.  Following a clear liquid diet.  Taking antibiotic medicines by mouth for 7-10 days. More severe cases may be treated at a hospital. Treatment may include:  Not eating or drinking.  Taking prescription pain medicine.  Receiving antibiotic medicines through an IV tube.  Receiving fluids and nutrition through an IV tube.  Surgery. HOME CARE INSTRUCTIONS   Follow your health care provider's instructions carefully.  Follow a full liquid diet or other diet as directed by your health care provider. After your symptoms improve, your health care provider may tell you to change your diet. He or she may recommend you eat a high-fiber diet. Fruits and vegetables are good sources of fiber. Fiber makes it easier to pass stool.  Take fiber supplements or probiotics as directed by your health care provider.  Only take medicines as directed by your health care provider.  Keep all your follow-up appointments. SEEK MEDICAL CARE IF:   Your pain does not improve.  You have a hard time eating food.  Your bowel movements do not return to normal. SEEK IMMEDIATE MEDICAL CARE IF:   Your pain becomes worse.  Your symptoms do not get better.  Your symptoms suddenly get worse.  You have a fever.  You have repeated  vomiting.  You have bloody or black, tarry stools. MAKE SURE YOU:   Understand these instructions.  Will watch your condition.  Will get help right away if you are not doing well or get worse.   This information is not intended to replace advice given to you by your health care provider. Make sure you discuss any questions you have with your  health care provider.   Document Released: 02/25/2005 Document Revised: 05/23/2013 Document Reviewed: 04/12/2013 Elsevier Interactive Patient Education [employer].

## 2015-07-17 NOTE — Assessment & Plan Note (Signed)
Would recommend evaluation especially given her history of lung cancer. She will wait for this GI issue to pass then get evaluation.

## 2015-07-17 NOTE — Assessment & Plan Note (Signed)
Treating empirically for diverticulitis as symptoms match. Checking CBC, CMP, lipase today. If no improvement low threshold for CT abdomen and pelvis given the additional pelvic bleeding.

## 2015-07-17 NOTE — Progress Notes (Signed)
Pre visit review using our clinic review tool, if applicable. No additional management support is needed unless otherwise documented below in the visit note. 

## 2015-07-26 ENCOUNTER — Ambulatory Visit: Payer: Medicare Other | Admitting: Adult Health

## 2015-08-28 ENCOUNTER — Other Ambulatory Visit: Payer: Self-pay | Admitting: Internal Medicine

## 2015-09-06 ENCOUNTER — Telehealth: Payer: Self-pay | Admitting: *Deleted

## 2015-09-06 IMAGING — PT NM PET TUM IMG INITIAL (PI) SKULL BASE T - THIGH
8 series · 25 of 25 positions shown · non-contrast
Comparison: CT chest 03/29/2013, 09/08/2012, 07/28/2011 and
07/24/2010.

CLINICAL DATA: Initial treatment strategy for lung nodule.

EXAM:
NUCLEAR MEDICINE PET SKULL BASE TO THIGH
TECHNIQUE: 5.8 mCi F-18 FDG was injected intravenously. Full-ring PET imaging
was performed from the skull base to thigh after the radiotracer. CT
data was obtained and used for attenuation correction and anatomic
localization.
FASTING BLOOD GLUCOSE:  Value: 95 mg/dl

[Series 3: pet sk_thigh ac · axial · 5.0mm · 4.07mm/px · z∈[-1216,-452]mm · 4 of 192 slices shown]
[im 1/192]
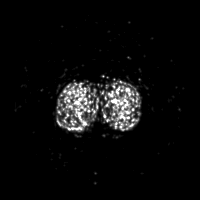
[im 64/192]
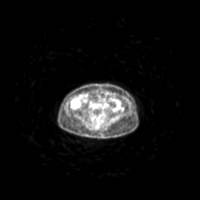
[im 128/192]
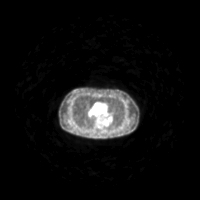
[im 192/192]
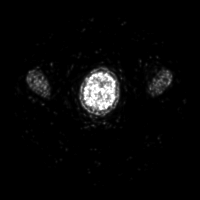

[Series 4: ct sk_thigh 5.0 b31f · axial · 5.0mm · 0.93mm/px · z∈[-1216,-452]mm · 5 of 192 slices shown]
[im 1/192]
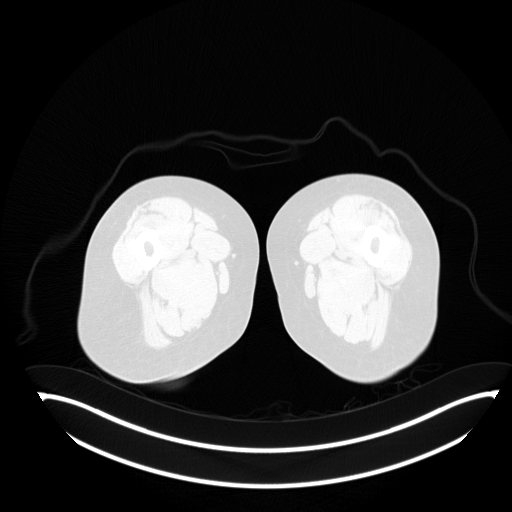
[im 48/192]
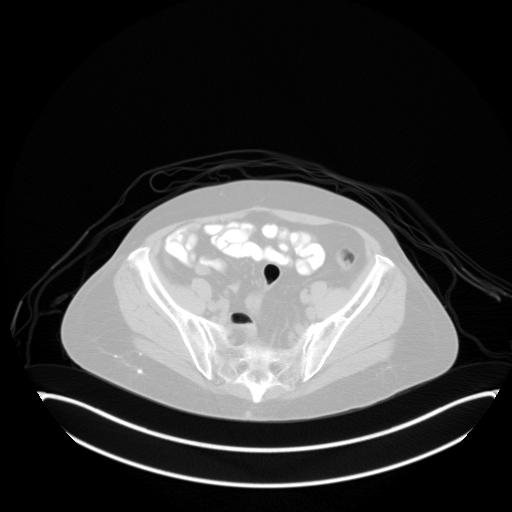
[im 96/192]
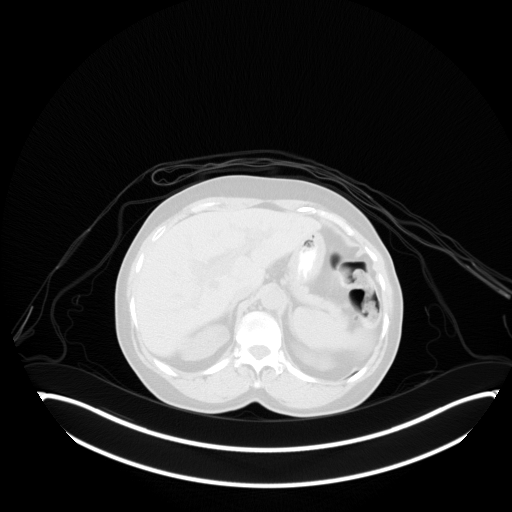
[im 144/192]
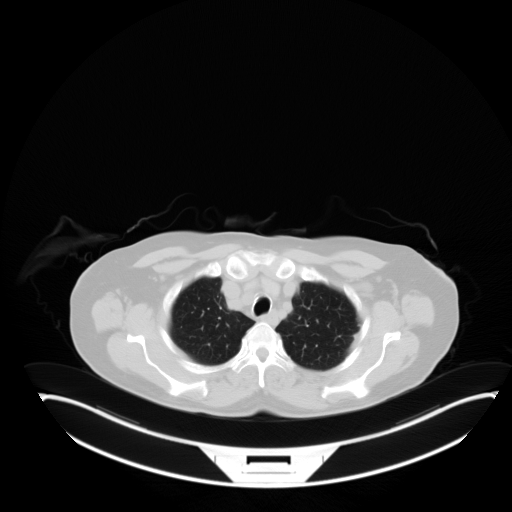
[im 192/192  brain]
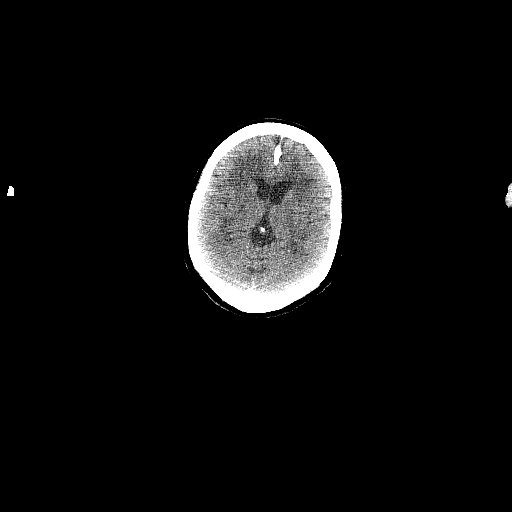

[Series 7: ct sk_thigh 5.0 b70f (id)_bone · axial · 5.0mm · 0.59mm/px · z∈[-850,-562]mm · 2 of 73 slices shown]
[im 1/73  bone]
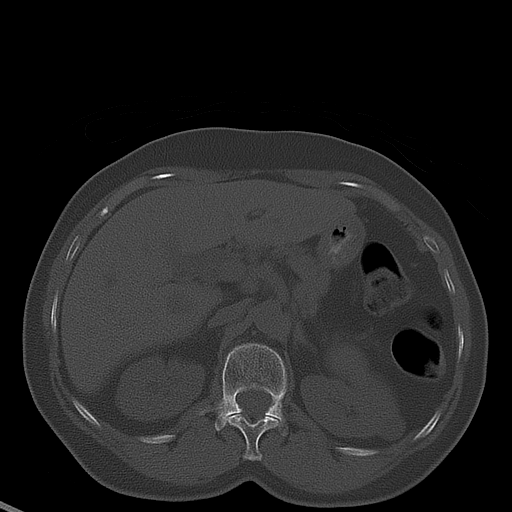
[im 73/73  bone]
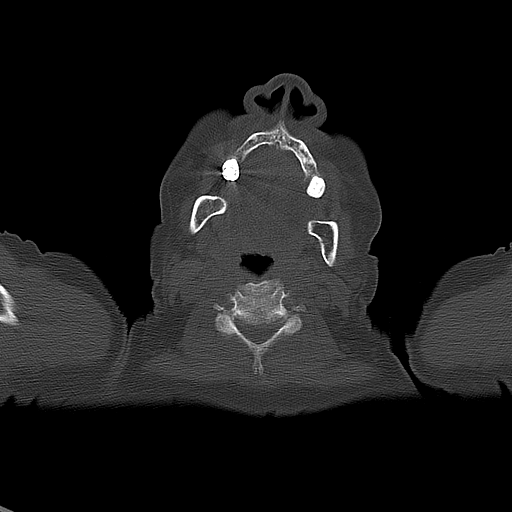

[Series 9: pet sk_thigh nac · axial · 5.0mm · 4.07mm/px · z∈[-1216,-452]mm · 5 of 192 slices shown]
[im 1/192]
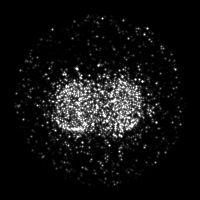
[im 48/192]
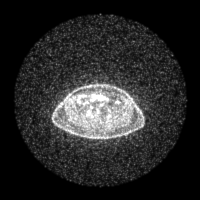
[im 96/192]
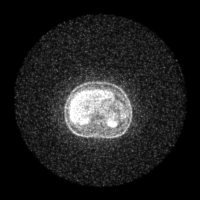
[im 144/192]
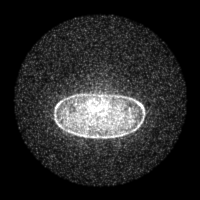
[im 192/192]
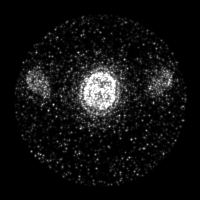

[Series 604: mip collection<mip range> · coronal · 1.68mm/px · 1 of 32 slices shown]
[im 1/32]
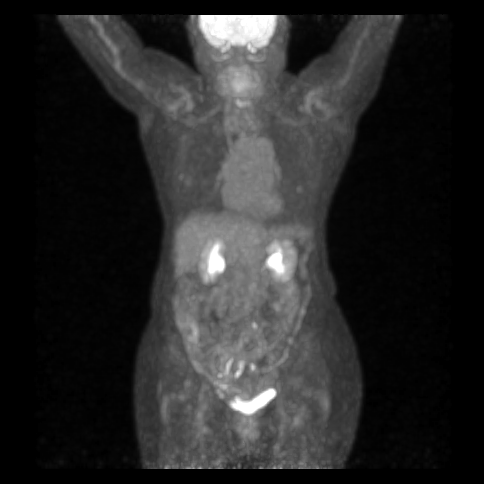

[Series 605: range-ct sk_thigh 5.0 (id)<alpha range> · 2 of 82 slices shown (1 of 2)]
[im 1/82]
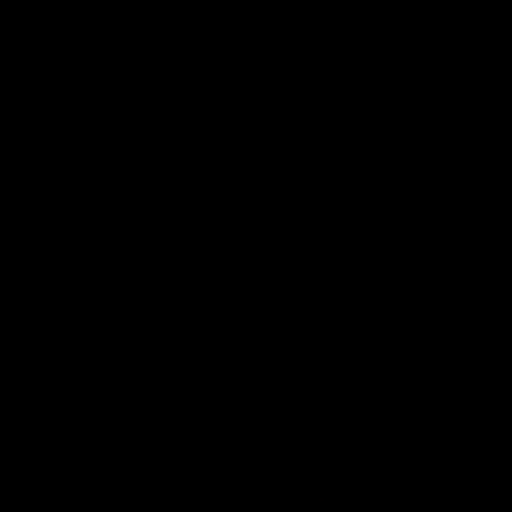
[im 82/82]
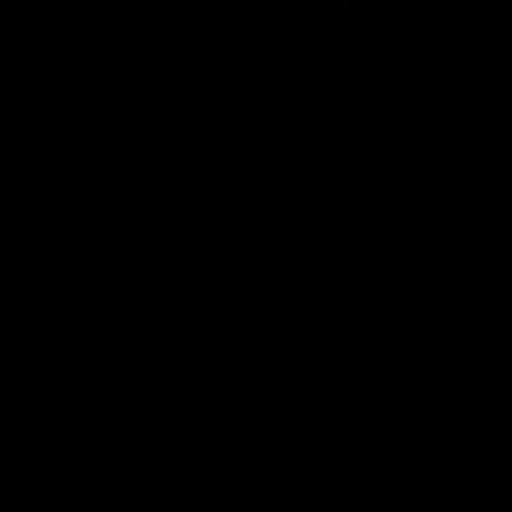

[Series 606: range-ct sk_thigh 5.0 (id)<alpha range> · 5 of 188 slices shown (2 of 2)]
[im 1/188]
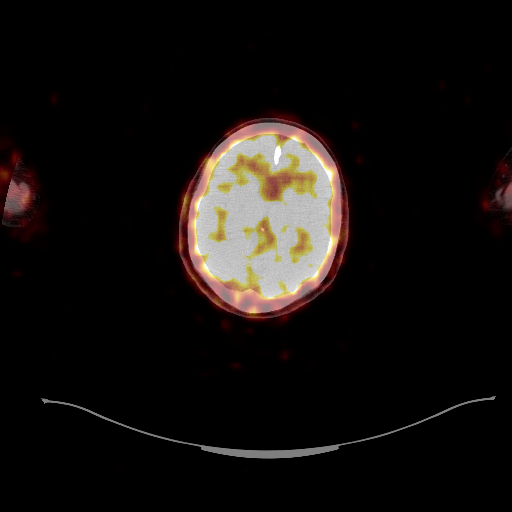
[im 47/188]
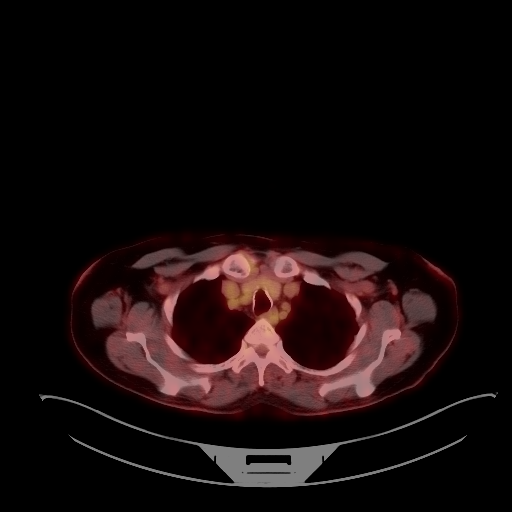
[im 94/188]
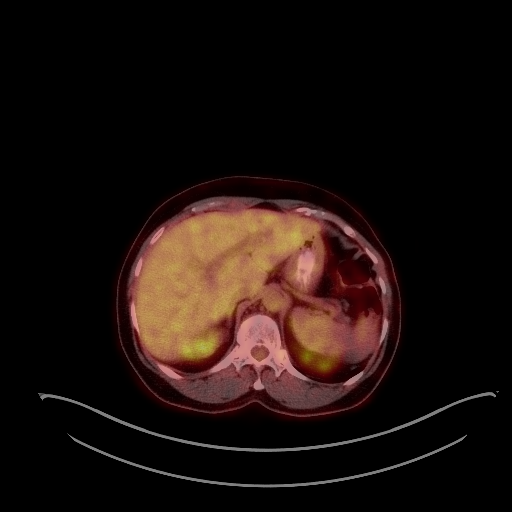
[im 141/188]
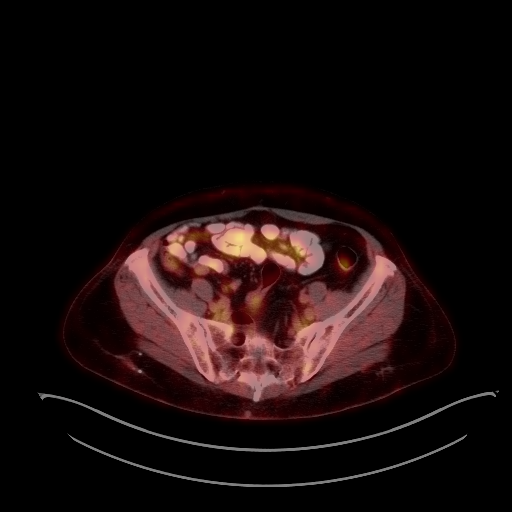
[im 188/188]
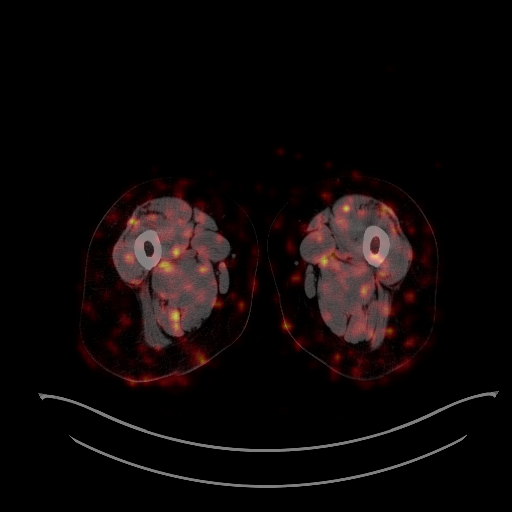

[Series 1032: results mm oncology reading · 0.91mm/px · 1 of 1 slices shown]
[im 1/1]
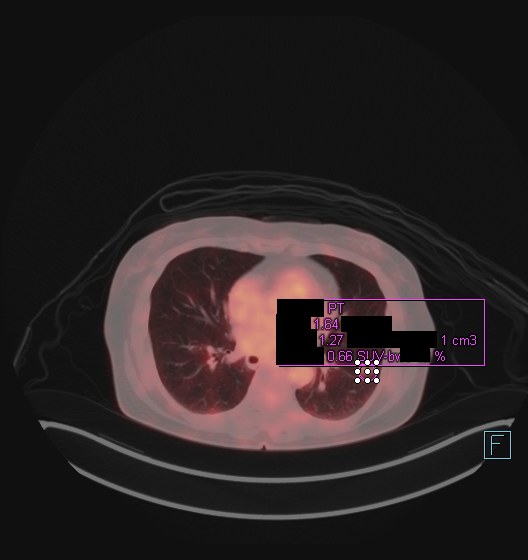

[25 of 25 positions shown; findings below may reference images not displayed]

FINDINGS: NECK

No hypermetabolic lymph nodes in the neck. CT images show no acute
findings. There is periventricular low attenuation within the deep
white matter, indicative of chronic microvascular white matter
ischemic changes. Visualized portions of the paranasal sinuses and
mastoid air cells are clear.

CHEST

A spiculated sub solid nodule in the left lower lobe measures 1.2 x
1.6 cm with the internal solid component measuring approximately 4
mm, medially. Lesion has an SUV max of 1.6. No hypermetabolic
mediastinal or hilar lymph nodes.

CT images show no pericardial or pleural effusion.

ABDOMEN/PELVIS

No abnormal hypermetabolism in the liver, adrenal glands, spleen or
pancreas. No hypermetabolic lymph nodes.

CT images show a low-attenuation lesions in the right hepatic lobe
measuring up to 1.7 cm, unchanged and likely cysts. Intrahepatic and
extrahepatic biliary duct dilatation appears unchanged from
08/26/2011. Extrahepatic bile duct measures approximately 12 mm.
Gallbladder and adrenal glands are unremarkable. Low-attenuation
lesions in the kidneys measure up to 1.8 cm on the right, similar to
08/26/2011. A 1.7 cm low-attenuation lesion in the interpolar right
kidney has mild high attenuation medially (series 4, image 104) but
is unchanged in size from 08/26/2011, suggesting a mildly complex
cyst. Spleen, pancreas, stomach and bowel are otherwise grossly
unremarkable. No free fluid.

SKELETON

No abnormal osseous hypermetabolism.
IMPRESSION: Progressively enlarging sub solid left lower lobe nodule with low
level FDG activity. Findings are most consistent with low-grade
adenocarcinoma. If this is a non-small cell lung cancer, it is most
consistent with stage IA disease.

## 2015-09-06 MED ORDER — TRAMADOL HCL 50 MG PO TABS: 50.0000 mg | ORAL_TABLET | Freq: Four times a day (QID) | ORAL | Status: DC | PRN

## 2015-09-06 NOTE — Telephone Encounter (Signed)
MD out of office pls advise on refill.../lmb 

## 2015-09-06 NOTE — Telephone Encounter (Signed)
Medication has been faxed to pharmacy

## 2015-09-06 NOTE — Telephone Encounter (Signed)
Received call pt requesting refill on her Tramadol...Johny Chess

## 2015-09-06 NOTE — Telephone Encounter (Signed)
Done hardcopy to Corinne  

## 2015-09-30 ENCOUNTER — Ambulatory Visit: Payer: Medicare Other | Admitting: Internal Medicine

## 2015-10-03 ENCOUNTER — Telehealth: Payer: Self-pay | Admitting: Internal Medicine

## 2015-10-03 NOTE — Telephone Encounter (Signed)
Patient no showed for 6 month fu on 5/1.  Please advise.

## 2015-10-07 ENCOUNTER — Other Ambulatory Visit (HOSPITAL_BASED_OUTPATIENT_CLINIC_OR_DEPARTMENT_OTHER): Payer: Medicare HMO

## 2015-10-07 ENCOUNTER — Other Ambulatory Visit: Payer: Self-pay | Admitting: *Deleted

## 2015-10-07 ENCOUNTER — Ambulatory Visit (HOSPITAL_COMMUNITY)
Admission: RE | Admit: 2015-10-07 | Discharge: 2015-10-07 | Disposition: A | Discharge status: Home / Self Care | Payer: Medicare HMO | Source: Ambulatory Visit | Attending: Internal Medicine | Admitting: Internal Medicine

## 2015-10-07 DIAGNOSIS — C3490 Malignant neoplasm of unspecified part of unspecified bronchus or lung: Secondary | ICD-10-CM

## 2015-10-07 DIAGNOSIS — K7689 Other specified diseases of liver: Secondary | ICD-10-CM | POA: Insufficient documentation

## 2015-10-07 DIAGNOSIS — C3492 Malignant neoplasm of unspecified part of left bronchus or lung: Secondary | ICD-10-CM | POA: Diagnosis not present

## 2015-10-07 DIAGNOSIS — N281 Cyst of kidney, acquired: Secondary | ICD-10-CM | POA: Diagnosis not present

## 2015-10-07 DIAGNOSIS — Z9889 Other specified postprocedural states: Secondary | ICD-10-CM | POA: Diagnosis not present

## 2015-10-07 LAB — COMPREHENSIVE METABOLIC PANEL
ALBUMIN: 3.7 g/dL (ref 3.5–5.0)
ALK PHOS: 83 U/L (ref 40–150)
ALT: 13 U/L (ref 0–55)
ANION GAP: 10 meq/L (ref 3–11)
AST: 15 U/L (ref 5–34)
BILIRUBIN TOTAL: 0.57 mg/dL (ref 0.20–1.20)
BUN: 14.2 mg/dL (ref 7.0–26.0)
CO2: 28 meq/L (ref 22–29)
CREATININE: 1.2 mg/dL — AB (ref 0.6–1.1)
Calcium: 9.6 mg/dL (ref 8.4–10.4)
Chloride: 100 mEq/L (ref 98–109)
EGFR: 50 mL/min/{1.73_m2} — ABNORMAL LOW (ref >90)
GLUCOSE: 88 mg/dL (ref 70–140)
Potassium: 4.1 mEq/L (ref 3.5–5.1)
Sodium: 138 mEq/L (ref 136–145)
TOTAL PROTEIN: 7.6 g/dL (ref 6.4–8.3)

## 2015-10-07 LAB — CBC WITH DIFFERENTIAL/PLATELET
BASO%: 1 % (ref 0.0–2.0)
BASOS ABS: 0.1 10*3/uL (ref 0.0–0.1)
EOS%: 4.2 % (ref 0.0–7.0)
Eosinophils Absolute: 0.4 10*3/uL (ref 0.0–0.5)
HCT: 35.1 % (ref 34.8–46.6)
HGB: 11.9 g/dL (ref 11.6–15.9)
LYMPH%: 25.2 % (ref 14.0–49.7)
MCH: 26.7 pg (ref 25.1–34.0)
MCHC: 33.7 g/dL (ref 31.5–36.0)
MCV: 79.1 fL — ABNORMAL LOW (ref 79.5–101.0)
MONO#: 0.7 10*3/uL (ref 0.1–0.9)
MONO%: 8.7 % (ref 0.0–14.0)
NEUT#: 5.2 10*3/uL (ref 1.5–6.5)
NEUT%: 60.9 % (ref 38.4–76.8)
Platelets: 183 10*3/uL (ref 145–400)
RBC: 4.44 10*6/uL (ref 3.70–5.45)
RDW: 15 % — AB (ref 11.2–14.5)
WBC: 8.6 10*3/uL (ref 3.9–10.3)
lymph#: 2.2 10*3/uL (ref 0.9–3.3)

## 2015-10-08 ENCOUNTER — Telehealth: Payer: Self-pay | Admitting: Internal Medicine

## 2015-10-08 NOTE — Telephone Encounter (Signed)
Faxed pt medical records to [facility] [phone]

## 2015-10-14 ENCOUNTER — Telehealth: Payer: Self-pay | Admitting: Internal Medicine

## 2015-10-14 ENCOUNTER — Ambulatory Visit (HOSPITAL_BASED_OUTPATIENT_CLINIC_OR_DEPARTMENT_OTHER): Payer: Medicare HMO | Admitting: Internal Medicine

## 2015-10-14 ENCOUNTER — Encounter: Payer: Self-pay | Admitting: Internal Medicine

## 2015-10-14 VITALS — BP 148/91 | HR 73 | Temp 98.3°F | Resp 18 | Ht 65.0 in | Wt 130.9 lb

## 2015-10-14 DIAGNOSIS — C3432 Malignant neoplasm of lower lobe, left bronchus or lung: Secondary | ICD-10-CM | POA: Diagnosis not present

## 2015-10-14 DIAGNOSIS — C3492 Malignant neoplasm of unspecified part of left bronchus or lung: Secondary | ICD-10-CM

## 2015-10-14 NOTE — Telephone Encounter (Signed)
Gave pt apt & avs °

## 2015-10-14 NOTE — Progress Notes (Signed)
[place] Telephone:([phone]) [phone]   [phone]  OFFICE PROGRESS NOTE  [name], MD [place] [address]  DIAGNOSIS: Stage IA (T1a, N0, M0) non-small cell lung cancer, well-differentiated adenocarcinoma diagnosed in July 2015.  PRIOR THERAPY: Status post left VATS with left lower lobe wedge resection as well as left basilar segmentectomy and lymph node dissection under the care of Dr. Roxan Hockey on 01/29/2014.  CURRENT THERAPY: Observation.  INTERVAL HISTORY: Amy Dunn 76 y.o. female returns to the clinic today for six-month follow-up visit. The patient is feeling fine today with no specific complaints. She denied having any significant shortness of breath except with exertion. She has no cough or hemoptysis. The patient denied having any significant weight loss or night sweats. She has no fever or chills. He has no nausea or vomiting. She had repeat CT scan of the chest performed recently and she is here for evaluation and discussion of her scan results.  MEDICAL HISTORY: Past Medical History  Diagnosis Date  . History of colonic polyps   . Acute peptic ulcer, unspecified site, with hemorrhage and perforation, without mention of obstruction   . Osteoarthritis of left shoulder   . Primary osteoarthritis of left hip   . Pulmonary nodule, left     Left Lower lobe  . GERD (gastroesophageal reflux disease)   . Cough   . Hypertension   . TIA (transient ischemic attack) 2011    transient blindness, speech- resolved  . Stroke [facility])     no residual  . Glaucoma     right eye  . Cancer of left lung ([place]) 2015    ALLERGIES:  is allergic to amlodipine and penicillins.  MEDICATIONS:  Current Outpatient Prescriptions  Medication Sig Dispense Refill  . atenolol (TENORMIN) 50 MG tablet TAKE 1 TABLET BY MOUTH EVERY DAY 90 tablet 2  . dorzolamide-timolol (COSOPT) 22.3-6.8 MG/ML ophthalmic solution Place 1 drop into the left  eye daily.     . fexofenadine (ALLEGRA) 180 MG tablet Take 180 mg by mouth daily as needed. For allergies    . fluticasone (FLONASE) 50 MCG/ACT nasal spray Place 2 sprays into both nostrils daily as needed for allergies or rhinitis.    Marland Kitchen losartan-hydrochlorothiazide (HYZAAR) 100-25 MG per tablet Take 1 tablet by mouth daily. 90 tablet 3  . Omeprazole-Sodium Bicarbonate (ZEGERID OTC) 20-1100 MG CAPS capsule Take 1 capsule by mouth daily before breakfast.    . traMADol (ULTRAM) 50 MG tablet Take 1 tablet (50 mg total) by mouth every 6 (six) hours as needed for moderate pain. 60 tablet 2  . metroNIDAZOLE (FLAGYL) 500 MG tablet   0   No current facility-administered medications for this visit.    SURGICAL HISTORY:  Past Surgical History  Procedure Laterality Date  . Colon surgery    . Surgical repair of peptic ulcer    . Tubal ligation    . Eye surgery Bilateral     cataracts  . Video assisted thoracoscopy (vats)/wedge resection Left 01/29/2014    Procedure: VIDEO ASSISTED THORACOSCOPY (VATS)/BASILAR SEGMENTECTOMY  LEFT LUNG LOWER LOBE WITH FROZEN SECTION, LEFT LOWER LOBECTOMY;  Surgeon: Melrose Nakayama, MD;  Location: [facility];  Service: Thoracic;  Laterality: Left;    REVIEW OF SYSTEMS:  A comprehensive review of systems was negative.   PHYSICAL EXAMINATION: General appearance: alert, cooperative and no distress Head: Normocephalic, without obvious abnormality, atraumatic Neck: no adenopathy, no JVD, supple, symmetrical, trachea midline  and thyroid not enlarged, symmetric, no tenderness/mass/nodules Lymph nodes: Cervical, supraclavicular, and axillary nodes normal. Resp: clear to auscultation bilaterally Back: symmetric, no curvature. ROM normal. No CVA tenderness. Cardio: regular rate and rhythm, S1, S2 normal, no murmur, click, rub or gallop GI: soft, non-tender; bowel sounds normal; no masses,  no organomegaly Extremities: extremities normal, atraumatic, no cyanosis or  edema  ECOG PERFORMANCE STATUS: 1 - Symptomatic but completely ambulatory  Blood pressure 148/91, pulse 73, temperature 98.3 F (36.8 C), temperature source Oral, resp. rate 18, height '5\' 5"'$  (1.651 m), weight 130 lb 14.4 oz (59.376 kg), SpO2 100 %.  LABORATORY DATA: Lab Results  Component Value Date   WBC 8.6 10/07/2015   HGB 11.9 10/07/2015   HCT 35.1 10/07/2015   MCV 79.1* 10/07/2015   PLT 183 10/07/2015      Chemistry      Component Value Date/Time   NA 138 10/07/2015 1106   NA 139 07/17/2015 0908   K 4.1 10/07/2015 1106   K 4.3 07/17/2015 0908   CL 99 07/17/2015 0908   CO2 28 10/07/2015 1106   CO2 31 07/17/2015 0908   BUN 14.2 10/07/2015 1106   BUN 15 07/17/2015 0908   CREATININE 1.2* 10/07/2015 1106   CREATININE 1.21* 07/17/2015 0908      Component Value Date/Time   CALCIUM 9.6 10/07/2015 1106   CALCIUM 9.5 07/17/2015 0908   ALKPHOS 83 10/07/2015 1106   ALKPHOS 89 07/17/2015 0908   AST 15 10/07/2015 1106   AST 16 07/17/2015 0908   ALT 13 10/07/2015 1106   ALT 8 07/17/2015 0908   BILITOT 0.57 10/07/2015 1106   BILITOT 0.4 07/17/2015 0908       RADIOGRAPHIC STUDIES: Ct Chest Wo Contrast  10/07/2015  CLINICAL DATA:  Lung cancer. EXAM: CT CHEST WITHOUT CONTRAST TECHNIQUE: Multidetector CT imaging of the chest was performed following the standard protocol without IV contrast. COMPARISON:  03/28/2015 FINDINGS: Mediastinum / Lymph Nodes: There is no axillary lymphadenopathy. No mediastinal lymphadenopathy. No evidence for gross hilar lymphadenopathy although assessment is limited by the lack of intravenous contrast on today's study. The heart size is normal. No pericardial effusion. The esophagus has normal imaging features. Lungs / Pleura: Biapical pleural-parenchymal scarring is evident, as before. Postsurgical change in the left base is compatible with the reported history of left lower lobe wedge resection. Subpleural scarring in the left upper hemi thorax is stable  in the interval. There is some soft tissue in the peripheral left lung base with thickening of the adjacent fissure, likely related to scarring. Overall appearance is stable in the interval. No new or progressive findings in the lungs on today's exam. Upper Abdomen: Multiple well-defined low-density lesions in the liver parenchyma are again noted, compatible with cyst. Stable prominence of the intra and extrahepatic bile ducts. Multiple cysts are noted in the kidneys bilaterally. MSK / Soft Tissues: Bone windows reveal no worrisome lytic or sclerotic osseous lesions. IMPRESSION: 1. Stable postsurgical changes in the left lung base. No new or progressive findings to suggest recurrent disease. 2. No interval change in the mild intra and extrahepatic biliary duct dilatation. 3. Stable hepatic and renal cysts. Electronically Signed   By: Misty Stanley M.D.   On: 10/07/2015 14:49    ASSESSMENT AND PLAN: This is a very pleasant 75 years old [place] American female recently diagnosed with a stage IA non-small cell lung cancer status post left lower lobe wedge resection and currently on observation. The patient is doing fine  today was no specific complaints. The recent CT scan of the chest showed no evidence for disease recurrence.  I discussed the scan results with the patient and recommended for her to continue on observation with repeat CT scan of the chest in 6 months. She was advised to call immediately if she has any concerning symptoms in the interval. The patient voices understanding of current disease status and treatment options and is in agreement with the current care plan.  All questions were answered. The patient knows to call the clinic with any problems, questions or concerns. We can certainly see the patient much sooner if necessary.  Disclaimer: This note was dictated with voice recognition software. Similar sounding words can inadvertently be transcribed and may not be corrected upon  review.

## 2015-10-18 NOTE — Telephone Encounter (Signed)
Fine

## 2015-11-01 ENCOUNTER — Other Ambulatory Visit: Payer: Self-pay | Admitting: Internal Medicine

## 2015-11-08 ENCOUNTER — Ambulatory Visit (INDEPENDENT_AMBULATORY_CARE_PROVIDER_SITE_OTHER): Payer: Medicare HMO | Admitting: Internal Medicine

## 2015-11-08 ENCOUNTER — Encounter: Payer: Self-pay | Admitting: Internal Medicine

## 2015-11-08 VITALS — BP 132/86 | HR 62 | Temp 98.7°F | Resp 14 | Ht 65.0 in | Wt 131.1 lb

## 2015-11-08 DIAGNOSIS — I10 Essential (primary) hypertension: Secondary | ICD-10-CM | POA: Diagnosis not present

## 2015-11-08 DIAGNOSIS — C3492 Malignant neoplasm of unspecified part of left bronchus or lung: Secondary | ICD-10-CM | POA: Diagnosis not present

## 2015-11-08 DIAGNOSIS — R0789 Other chest pain: Secondary | ICD-10-CM

## 2015-11-08 MED ORDER — METRONIDAZOLE 500 MG PO TABS: 500.0000 mg | ORAL_TABLET | Freq: Three times a day (TID) | ORAL | Status: DC

## 2015-11-08 NOTE — Progress Notes (Signed)
   Subjective:    Patient ID: Amy Dunn, female    DOB: 1940/08/22, 75 y.o.   MRN: [id number]  HPI The patient is a 75 YO female coming in for follow up of her chest wall pain from her previous lung cancer. She is doing somewhat better. This still hurts her but only on 2-3 days per week. Previously it was about every day. Still 4-5/10 when it hurts. It is also lasting less time. She is using tylenol when needed. Tries to avoid as much as possible.   Review of Systems  Constitutional: Negative.   Respiratory: Negative for cough, chest tightness, shortness of breath and wheezing.   Cardiovascular: Negative for chest pain, palpitations and leg swelling.  Gastrointestinal: Negative for diarrhea, constipation and abdominal distention.  Musculoskeletal: Positive for myalgias.       Arm swelling  Skin: Negative.   Neurological: Negative.   Psychiatric/Behavioral: Negative.       Objective:   Physical Exam  Constitutional: She is oriented to person, place, and time. She appears well-developed and well-nourished.  HENT:  Head: Normocephalic and atraumatic.  Eyes: EOM are normal.  Neck: Normal range of motion.  Cardiovascular: Normal rate and regular rhythm.   Pulmonary/Chest: Effort normal and breath sounds normal. No respiratory distress. She has no wheezes. She has no rales.  Abdominal: Soft.  Neurological: She is alert and oriented to person, place, and time.  Skin: Skin is warm and dry.   Filed Vitals:   11/08/15 1537  BP: 132/86  Pulse: 62  Temp: 98.7 F (37.1 C)  TempSrc: Oral  Resp: 14  Height: '5\' 5"'$  (1.651 m)  Weight: 131 lb 1.9 oz (59.476 kg)  SpO2: 98%      Assessment & Plan:

## 2015-11-08 NOTE — Patient Instructions (Signed)
We have sent in more of the antibiotics to see if they help with the pain even more.

## 2015-11-08 NOTE — Progress Notes (Signed)
Pre visit review using our clinic review tool, if applicable. No additional management support is needed unless otherwise documented below in the visit note. 

## 2015-11-09 NOTE — Assessment & Plan Note (Signed)
Doing better at this time since she is further from lung cancer. She is taking tylenol prn. Following with oncology for imaging.

## 2015-11-09 NOTE — Assessment & Plan Note (Signed)
BP at goal on losartan/hctz and reviewed last BMP with her at visit and no indication for change.

## 2015-12-06 ENCOUNTER — Other Ambulatory Visit: Payer: Self-pay | Admitting: Internal Medicine

## 2015-12-16 ENCOUNTER — Other Ambulatory Visit: Payer: Self-pay | Admitting: *Deleted

## 2015-12-16 MED ORDER — LOSARTAN POTASSIUM-HCTZ 100-25 MG PO TABS: 1.0000 | ORAL_TABLET | Freq: Every day | ORAL | Status: DC

## 2016-01-13 ENCOUNTER — Telehealth: Payer: Self-pay | Admitting: *Deleted

## 2016-01-13 MED ORDER — METOPROLOL SUCCINATE ER 50 MG PO TB24: 50.0000 mg | ORAL_TABLET | Freq: Every day | ORAL | 0 refills | Status: DC

## 2016-01-13 NOTE — Telephone Encounter (Signed)
Notified pt w/MD response.../lmb 

## 2016-01-13 NOTE — Telephone Encounter (Signed)
Sent in metoprolol extended release. Take 1 pill daily. This should be equivalent to her metoprolol.

## 2016-01-13 NOTE — Telephone Encounter (Signed)
Left msg on triage stating her pharmacy inform her that the Atenolol is on back and they are not sure when they will get some in. Needing a alternative for atenolol...Amy Dunn

## 2016-01-24 ENCOUNTER — Other Ambulatory Visit (INDEPENDENT_AMBULATORY_CARE_PROVIDER_SITE_OTHER): Payer: Medicare HMO

## 2016-01-24 ENCOUNTER — Ambulatory Visit (INDEPENDENT_AMBULATORY_CARE_PROVIDER_SITE_OTHER): Payer: Medicare HMO | Admitting: Internal Medicine

## 2016-01-24 ENCOUNTER — Encounter: Payer: Self-pay | Admitting: Internal Medicine

## 2016-01-24 VITALS — BP 174/102 | HR 69 | Temp 98.3°F | Resp 12 | Ht 65.0 in | Wt 133.0 lb

## 2016-01-24 DIAGNOSIS — I10 Essential (primary) hypertension: Secondary | ICD-10-CM

## 2016-01-24 DIAGNOSIS — R109 Unspecified abdominal pain: Secondary | ICD-10-CM

## 2016-01-24 DIAGNOSIS — R197 Diarrhea, unspecified: Secondary | ICD-10-CM | POA: Diagnosis not present

## 2016-01-24 LAB — COMPREHENSIVE METABOLIC PANEL
ALBUMIN: 4.1 g/dL (ref 3.5–5.2)
ALK PHOS: 79 U/L (ref 39–117)
ALT: 12 U/L (ref 0–35)
AST: 16 U/L (ref 0–37)
BUN: 11 mg/dL (ref 6–23)
CO2: 29 mEq/L (ref 19–32)
CREATININE: 1.17 mg/dL (ref 0.40–1.20)
Calcium: 9.2 mg/dL (ref 8.4–10.5)
Chloride: 104 mEq/L (ref 96–112)
GFR: 57.64 mL/min — ABNORMAL LOW (ref >60.00)
GLUCOSE: 95 mg/dL (ref 70–99)
POTASSIUM: 3.9 meq/L (ref 3.5–5.1)
SODIUM: 140 meq/L (ref 135–145)
TOTAL PROTEIN: 7.5 g/dL (ref 6.0–8.3)
Total Bilirubin: 0.6 mg/dL (ref 0.2–1.2)

## 2016-01-24 LAB — CBC
HCT: 36 % (ref 36.0–46.0)
HEMOGLOBIN: 12.4 g/dL (ref 12.0–15.0)
MCHC: 34.5 g/dL (ref 30.0–36.0)
MCV: 80.6 fl (ref 78.0–100.0)
Platelets: 186 10*3/uL (ref 150.0–400.0)
RBC: 4.46 Mil/uL (ref 3.87–5.11)
RDW: 14.6 % (ref 11.5–15.5)
WBC: 8.6 10*3/uL (ref 4.0–10.5)

## 2016-01-24 LAB — LIPASE: LIPASE: 28 U/L (ref 11.0–59.0)

## 2016-01-24 NOTE — Progress Notes (Signed)
Pre visit review using our clinic review tool, if applicable. No additional management support is needed unless otherwise documented below in the visit note. 

## 2016-01-24 NOTE — Assessment & Plan Note (Signed)
She has had to switch from atenolol to metoprolol due to shortage. She has not taken meds this morning. Will watch until this acute GI problem is resolved.

## 2016-01-24 NOTE — Patient Instructions (Signed)
We are checking the CT scan of the stomach to see if we can find the cause of the diarrhea.   We are also checking the blood work today.   Try the pepto bismal or gas-x for the symptoms to see if it helps.

## 2016-01-24 NOTE — Progress Notes (Signed)
   Subjective:    Patient ID: Amy Dunn, female    DOB: 1940-06-18, 75 y.o.   MRN: [id number]  HPI The patient is a 75 YO female coming in for abdominal pain and diarrhea. Started about 1 week ago. Denies fevers or chills. The pain is diffuse and not localized. Starts after she eats with the pain, then she has some diarrhea, after that the pain subsides and she gets bloating which lasts until she eats again. This is different from the stomach pain she had about 6 months ago. She denies any blood in the diarrhea. She has not tried anything for the symptoms. Worse with fatty foods and not helped by anything.   Review of Systems  Constitutional: Positive for fatigue. Negative for activity change, appetite change, fever and unexpected weight change.  Respiratory: Negative for cough, chest tightness, shortness of breath and wheezing.   Cardiovascular: Negative for chest pain, palpitations and leg swelling.  Gastrointestinal: Positive for abdominal pain, diarrhea and nausea. Negative for abdominal distention, anal bleeding, blood in stool, constipation and vomiting.  Musculoskeletal: Negative.   Skin: Negative.   Neurological: Negative.   Psychiatric/Behavioral: Negative.       Objective:   Physical Exam  Constitutional: She is oriented to person, place, and time. She appears well-developed and well-nourished.  HENT:  Head: Normocephalic and atraumatic.  Eyes: EOM are normal.  Neck: Normal range of motion.  Cardiovascular: Normal rate and regular rhythm.   Pulmonary/Chest: Effort normal and breath sounds normal. No respiratory distress. She has no wheezes. She has no rales.  Sore to touch over the left chest wall on the side, stable  Abdominal: Soft. Bowel sounds are normal. She exhibits no distension and no mass. There is tenderness. There is no rebound and no guarding.  Mild tenderness diffusely, not radiating and no rebound or guarding, BS normal  Neurological: She is alert and oriented  to person, place, and time.  Skin: Skin is warm and dry.   Vitals:   01/24/16 0825 01/24/16 0848  BP: (!) 190/100 (!) 174/102  Pulse: 69   Resp: 12   Temp: 98.3 F (36.8 C)   TempSrc: Oral   SpO2: 98%   Weight: 133 lb (60.3 kg)   Height: '5\' 5"'$  (1.651 m)       Assessment & Plan:

## 2016-01-24 NOTE — Assessment & Plan Note (Addendum)
Checking CT abdomen and pelvis with contrast for prior vaginal bleeding (which has stopped and she did not get evaluated as recommended) and the diffuse abdominal pain. This is not the same as her prior presumed diverticulitis and is not consistent with that history. She will try pepto bismal for the symptoms. Checking CBC, CMP, lipase today. Referral to GI per her request Dr. Benson [place].

## 2016-01-27 DIAGNOSIS — K588 Other irritable bowel syndrome: Secondary | ICD-10-CM | POA: Diagnosis not present

## 2016-01-27 DIAGNOSIS — R140 Abdominal distension (gaseous): Secondary | ICD-10-CM | POA: Diagnosis not present

## 2016-01-27 DIAGNOSIS — K5733 Diverticulitis of large intestine without perforation or abscess with bleeding: Secondary | ICD-10-CM | POA: Diagnosis not present

## 2016-02-06 ENCOUNTER — Ambulatory Visit
Admission: RE | Admit: 2016-02-06 | Discharge: 2016-02-06 | Disposition: A | Discharge status: Home / Self Care | Payer: Medicare HMO | Source: Ambulatory Visit | Attending: Internal Medicine | Admitting: Internal Medicine

## 2016-02-06 DIAGNOSIS — R109 Unspecified abdominal pain: Secondary | ICD-10-CM

## 2016-02-06 DIAGNOSIS — R197 Diarrhea, unspecified: Secondary | ICD-10-CM

## 2016-02-06 DIAGNOSIS — N281 Cyst of kidney, acquired: Secondary | ICD-10-CM | POA: Diagnosis not present

## 2016-02-06 MED ORDER — IOPAMIDOL (ISOVUE-300) INJECTION 61%
100.0000 mL | Freq: Once | INTRAVENOUS | Status: AC | PRN
  Administered 2016-02-06: 100 mL via INTRAVENOUS

## 2016-02-24 DIAGNOSIS — R140 Abdominal distension (gaseous): Secondary | ICD-10-CM | POA: Diagnosis not present

## 2016-02-24 DIAGNOSIS — K5733 Diverticulitis of large intestine without perforation or abscess with bleeding: Secondary | ICD-10-CM | POA: Diagnosis not present

## 2016-02-24 DIAGNOSIS — K588 Other irritable bowel syndrome: Secondary | ICD-10-CM | POA: Diagnosis not present

## 2016-03-07 ENCOUNTER — Encounter: Payer: Self-pay | Admitting: Family Medicine

## 2016-03-07 ENCOUNTER — Ambulatory Visit (INDEPENDENT_AMBULATORY_CARE_PROVIDER_SITE_OTHER): Payer: Medicare HMO | Admitting: Family Medicine

## 2016-03-07 ENCOUNTER — Other Ambulatory Visit: Payer: Self-pay | Admitting: Family Medicine

## 2016-03-07 VITALS — BP 190/102 | HR 82 | Temp 98.6°F | Resp 16 | Ht 65.0 in | Wt 129.0 lb

## 2016-03-07 DIAGNOSIS — R51 Headache: Secondary | ICD-10-CM

## 2016-03-07 DIAGNOSIS — R519 Headache, unspecified: Secondary | ICD-10-CM

## 2016-03-07 DIAGNOSIS — I10 Essential (primary) hypertension: Secondary | ICD-10-CM | POA: Diagnosis not present

## 2016-03-07 MED ORDER — AMLODIPINE BESYLATE 5 MG PO TABS: 2.5000 mg | ORAL_TABLET | Freq: Every day | ORAL | 0 refills | Status: DC

## 2016-03-07 NOTE — Progress Notes (Signed)
Pre visit review using our clinic review tool, if applicable. No additional management support is needed unless otherwise documented below in the visit note. 

## 2016-03-07 NOTE — Progress Notes (Signed)
HPI:  ACUTE VISIT:  Chief Complaint  Patient presents with  . Headache    and dizziness; high blood pressure concerns; change in medications     [name] is a 75 y.o. female, who is here today complaining of elevated BP and headache.  Hx of HTN, BP reading at home last night 224/190. Last night she had mild dizziness , intermittently.  Occipital headache, which she has had intermittently for about 3 weeks, seems to be getting worse, throbbing sensation, max 8/10, better today, "not bad." No associated nausea or vomiting. No numbness or tingling.   According to pt, her BP has been elevated sine her Atenolol was changed to Metoprolol Succinate about a month ago, the former one is backorder at the pharmacy. She is also on Hyzaar 100-25 mg 1/2 tab daily. In the past she has taken Amlodipine, which cause mild constipation but "not bad."  Denies  visual changes, chest pain, dyspnea, palpitation, claudication, focal weakness, or edema. Denies orthopnea or PND.  Reporting Hx of TIA and "eye blood clot" about 3-4 years ago.  She is here alone, drove today.  She has not taken any OTC analgesic for headache today. + Stress.  She has not noted gross hematuria or decreased urine output. She denies taking herbal remedies,OTC cold meds, or decongestants.     Review of Systems  Constitutional: Negative for activity change, appetite change, diaphoresis, fatigue, fever and unexpected weight change.  HENT: Negative for mouth sores, nosebleeds, trouble swallowing and voice change.   Eyes: Negative for photophobia, pain, redness and visual disturbance.  Respiratory: Negative for cough, shortness of breath and wheezing.   Cardiovascular: Negative for chest pain, palpitations and leg swelling.  Gastrointestinal: Negative for abdominal pain, nausea and vomiting.       Negative for changes in bowel habits.  Genitourinary: Negative for decreased urine volume, difficulty urinating  and hematuria.  Skin: Negative for pallor and rash.  Neurological: Positive for headaches. Negative for seizures, syncope, speech difficulty, weakness and numbness.  Psychiatric/Behavioral: Negative for confusion. The patient is not nervous/anxious.       Current Outpatient Prescriptions on File Prior to Visit  Medication Sig Dispense Refill  . dorzolamide-timolol (COSOPT) 22.3-6.8 MG/ML ophthalmic solution Place 1 drop into the left eye daily.     . fexofenadine (ALLEGRA) 180 MG tablet Take 180 mg by mouth daily as needed. For allergies    . fluticasone (FLONASE) 50 MCG/ACT nasal spray Place 2 sprays into both nostrils daily as needed for allergies or rhinitis.    Marland Kitchen losartan-hydrochlorothiazide (HYZAAR) 100-25 MG tablet Take 1 tablet by mouth daily. 90 tablet 2  . metoprolol succinate (TOPROL-XL) 50 MG 24 hr tablet Take 1 tablet (50 mg total) by mouth daily. Take with or immediately following a meal. 90 tablet 0  . Omeprazole-Sodium Bicarbonate (ZEGERID OTC) 20-1100 MG CAPS capsule Take 1 capsule by mouth daily before breakfast.    . traMADol (ULTRAM) 50 MG tablet Take 1 tablet (50 mg total) by mouth every 6 (six) hours as needed for moderate pain. 60 tablet 2   No current facility-administered medications on file prior to visit.      Past Medical History:  Diagnosis Date  . Acute peptic ulcer, unspecified site, with hemorrhage and perforation, without mention of obstruction   . Cancer of left lung ([place]) 2015  . Cough   . GERD (gastroesophageal reflux disease)   . Glaucoma    right eye  . History of  colonic polyps   . Hypertension   . Osteoarthritis of left shoulder   . Primary osteoarthritis of left hip   . Pulmonary nodule, left    Left Lower lobe  . Stroke [facility])    no residual  . TIA (transient ischemic attack) 2011   transient blindness, speech- resolved   Allergies  Allergen Reactions  . Penicillins Itching    Social History   Social History  . Marital status:  Married    Spouse name: N/A  . Number of children: N/A  . Years of education: N/A   Social History Main Topics  . Smoking status: Former Smoker    Years: 1.00    Types: Cigarettes    Quit date: 05/26/1981  . Smokeless tobacco: Never Used     Comment: 2 ciggs per day  . Alcohol use No  . Drug use: No  . Sexual activity: Not Asked   Other Topics Concern  . None   Social History Narrative   HSG   Married- '62   2 daughters- '65, '70; 3 grandchildren   Occupation retired- Lawyer of life: No CPR, no intubation, no heroic measures (pt provided end of life packet)    Vitals:   03/07/16 0928  BP: (!) 190/102  Pulse: 82  Resp: 16  Temp: 98.6 F (37 C)   O2 sat at RA 98%  Body mass index is 21.47 kg/m.   Initial BP. 210/160   Physical Exam  Nursing note and vitals reviewed. Constitutional: She is oriented to person, place, and time. She appears well-developed. She does not appear ill. No distress.  HENT:  Head: Atraumatic.  Mouth/Throat: Oropharynx is clear and moist and mucous membranes are normal.  Eyes: Conjunctivae and EOM are normal. Pupils are equal, round, and reactive to light.  Neck: No Brudzinski's sign and no Kernig's sign noted. No thyromegaly present.  Cardiovascular: Normal rate and regular rhythm.   No murmur heard. Pulses:      Dorsalis pedis pulses are 2+ on the right side, and 2+ on the left side.  Respiratory: Effort normal and breath sounds normal. No respiratory distress.  GI: Soft. She exhibits no mass. There is no hepatomegaly. There is no tenderness.  Musculoskeletal: She exhibits no edema or tenderness.       Cervical back: She exhibits no tenderness.  Occipital scalp mild tenderness with palpation. Minimal limitation cervical rotation, no pain elicited.  Lymphadenopathy:    She has no cervical adenopathy.  Neurological: She is alert and oriented to person, place, and time. She has normal strength. No cranial nerve deficit or  sensory deficit. Coordination and gait normal.  Reflex Scores:      Bicep reflexes are 2+ on the right side and 2+ on the left side.      Patellar reflexes are 2+ on the right side and 2+ on the left side. Pronator drift negative.  Skin: Skin is warm. No erythema.  Psychiatric: She has a normal mood and affect.  Well groomed, good eye contact.      ASSESSMENT AND PLAN:     [place] was seen today for headache.  Diagnoses and all orders for this visit:  Essential hypertension  Not well controlled. Improved when re-checked, no neurologic abnormalities found on examination today. Hyzaar increased from 1/2 tab to 1 tab daily, no changes in Metoprolol Succinate 50 mg, and Amlodipine 2.5 mg added. We discussed some side effects of medications, including CCB, she is willing to  try Amlodipine again.  Possible complications of elevated BP discussed, explained that complications may developed sudden or over time.  I recommended stopping Aspirin for now until BP better controlled, avoid driving for now. Continue monitoring BP at home and seek immediate medical attention if BP > 200/110 or if any symptom regardless of BP. She voices understanding and agrees with plan. F/U in 2 days.   -     amLODipine (NORVASC) 5 MG tablet; Take 0.5 tablets (2.5 mg total) by mouth daily.  Occipital headache  Possible causes discussed. Could be related to HTN or tension like headache. Monitor for new symptoms or sudden worsening. She understands warning signs. She can take Tylenol if needed.       Return in about 2 days (around 03/09/2016) for HTN.       Betty G. Martinique, MD  [facility] [address]. [place] office.

## 2016-03-07 NOTE — Patient Instructions (Addendum)
A few things to remember from today's visit:   Essential hypertension  Occipital headache Blood pressure goal for most people is less than 140/90.Some populations (older than 60) the goal is less than 150/90.  Elevated blood pressure increases the risk of strokes, heart and kidney disease, and eye problems. Regular physical activity and a healthy diet (DASH diet) usually help. Low salt diet. Take medications as instructed. Caution with some over the counter medications as cold medications, dietary products (for weight loss), and Ibuprofen or Aleve (frequent use);all these medications could cause elevation of blood pressure.  Monitor for sudden worsening symptoms, blood pressure > 200/110 go to ER OR if symptoms regardless of BP call 911 or seek immediate medical attention. Ask somebody to drive you to your next appointment. No Aspirin for now.  Follow in 2 days.  Please be sure medication list is accurate. If a new problem present, please set up appointment sooner than planned today.

## 2016-03-09 ENCOUNTER — Ambulatory Visit: Payer: Medicare HMO | Admitting: Nurse Practitioner

## 2016-03-10 ENCOUNTER — Ambulatory Visit (INDEPENDENT_AMBULATORY_CARE_PROVIDER_SITE_OTHER): Payer: Medicare HMO | Admitting: Nurse Practitioner

## 2016-03-10 ENCOUNTER — Encounter: Payer: Self-pay | Admitting: Nurse Practitioner

## 2016-03-10 VITALS — BP 138/90 | HR 80 | Temp 97.6°F | Ht 65.0 in | Wt 128.0 lb

## 2016-03-10 DIAGNOSIS — I10 Essential (primary) hypertension: Secondary | ICD-10-CM | POA: Diagnosis not present

## 2016-03-10 DIAGNOSIS — Z23 Encounter for immunization: Secondary | ICD-10-CM

## 2016-03-10 NOTE — Progress Notes (Signed)
Pre visit review using our clinic review tool, if applicable. No additional management support is needed unless otherwise documented below in the visit note. 

## 2016-03-10 NOTE — Progress Notes (Signed)
Subjective:  Patient ID: Amy Dunn, female    DOB: 1941-03-25  Age: 75 y.o. MRN: [id number]  CC: Hypertension (follow up Pt stated blood pressure last BP 147/90.)   Hypertension  This is a chronic problem. The current episode started in the past 7 days. The problem has been gradually improving since onset. The problem is controlled. Pertinent negatives include no blurred vision, chest pain, headaches, malaise/fatigue, orthopnea, palpitations, peripheral edema, PND or shortness of breath. There are no associated agents to hypertension. Risk factors for coronary artery disease include family history, post-menopausal state and sedentary lifestyle. Past treatments include beta blockers, diuretics and ACE inhibitors. The current treatment provides significant improvement. Compliance problems include diet.  Hypertensive end-organ damage includes CVA. There is no history of renovascular disease. There is no history of a hypertension causing med.    Outpatient Medications Prior to Visit  Medication Sig Dispense Refill  . amLODipine (NORVASC) 5 MG tablet TAKE 1/2 TABLET BY MOUTH DAILY 45 tablet 0  . dorzolamide-timolol (COSOPT) 22.3-6.8 MG/ML ophthalmic solution Place 1 drop into the left eye daily.     . fexofenadine (ALLEGRA) 180 MG tablet Take 180 mg by mouth daily as needed. For allergies    . fluticasone (FLONASE) 50 MCG/ACT nasal spray Place 2 sprays into both nostrils daily as needed for allergies or rhinitis.    Marland Kitchen losartan-hydrochlorothiazide (HYZAAR) 100-25 MG tablet Take 1 tablet by mouth daily. 90 tablet 2  . metoprolol succinate (TOPROL-XL) 50 MG 24 hr tablet Take 1 tablet (50 mg total) by mouth daily. Take with or immediately following a meal. 90 tablet 0  . Omeprazole-Sodium Bicarbonate (ZEGERID OTC) 20-1100 MG CAPS capsule Take 1 capsule by mouth daily before breakfast.    . traMADol (ULTRAM) 50 MG tablet Take 1 tablet (50 mg total) by mouth every 6 (six) hours as needed for moderate  pain. 60 tablet 2   No facility-administered medications prior to visit.     ROS See HPI  Objective:  BP 138/90   Pulse 80   Temp 97.6 F (36.4 C)   Ht '5\' 5"'$  (1.651 m)   Wt 128 lb (58.1 kg)   SpO2 97%   BMI 21.30 kg/m   BP Readings from Last 3 Encounters:  03/10/16 138/90  03/07/16 (!) 190/102  01/24/16 (!) 174/102    Wt Readings from Last 3 Encounters:  03/10/16 128 lb (58.1 kg)  03/07/16 129 lb (58.5 kg)  01/24/16 133 lb (60.3 kg)    Physical Exam  Constitutional: She is oriented to person, place, and time. No distress.  Neck: Normal range of motion. Neck supple.  Cardiovascular: Normal rate and normal heart sounds.   Pulmonary/Chest: Effort normal and breath sounds normal.  Musculoskeletal: She exhibits no edema.  Lymphadenopathy:    She has no cervical adenopathy.  Neurological: She is alert and oriented to person, place, and time.  Skin: Skin is warm and dry.  Psychiatric: She has a normal mood and affect. Her behavior is normal.  Vitals reviewed.   Lab Results  Component Value Date   WBC 8.6 01/24/2016   HGB 12.4 01/24/2016   HCT 36.0 01/24/2016   PLT 186.0 01/24/2016   GLUCOSE 95 01/24/2016   CHOL 212 (H) 04/01/2015   TRIG 199.0 (H) 04/01/2015   HDL 47.90 04/01/2015   LDLDIRECT 100.8 03/18/2010   LDLCALC 125 (H) 04/01/2015   ALT 12 01/24/2016   AST 16 01/24/2016   NA 140 01/24/2016   K 3.9 01/24/2016  CL 104 01/24/2016   CREATININE 1.17 01/24/2016   BUN 11 01/24/2016   CO2 29 01/24/2016   TSH 0.66 10/22/2011   INR 0.97 01/25/2014   HGBA1C 5.7 (H) 01/17/2012    Ct Abdomen Pelvis W Contrast  Result Date: 02/06/2016 CLINICAL DATA:  Right-sided abdominal pain, diarrhea. EXAM: CT ABDOMEN AND PELVIS WITH CONTRAST TECHNIQUE: Multidetector CT imaging of the abdomen and pelvis was performed using the standard protocol following bolus administration of intravenous contrast. CONTRAST:  132m ISOVUE-300 IOPAMIDOL (ISOVUE-300) INJECTION 61%  COMPARISON:  CT scan of August 26, 2011. FINDINGS: Lower chest:  Visualized lung bases are unremarkable. Hepatobiliary: Gallbladder is contracted without gallstones. Right hepatic cysts are noted. Pancreas: Stable dilatation of pancreatic duct. Pancreas appears otherwise normal. Spleen: Normal. Adrenals/Urinary Tract: Adrenal glands appear normal. Bilateral simple renal cysts are noted. No hydronephrosis or renal obstruction is noted. Stomach/Bowel: There is no evidence of bowel obstruction. Vascular/Lymphatic: No significant adenopathy is noted. Reproductive: Uterus and ovaries are unremarkable. Musculoskeletal: No significant osseous abnormality is noted. Other: No abnormal fluid collection is noted. IMPRESSION: Right hepatic cysts are noted. Bilateral renal cysts are noted. No significant abnormality seen in the abdomen or pelvis. Electronically Signed   By: JMarijo Conception M.D.   On: 02/06/2016 14:57    Assessment & Plan:   MBreewas seen today for hypertension.  Diagnoses and all orders for this visit:  Essential hypertension   I am having Ms. TBarbiermaintain her fexofenadine, dorzolamide-timolol, fluticasone, Omeprazole-Sodium Bicarbonate, traMADol, losartan-hydrochlorothiazide, metoprolol succinate, amLODipine, and atenolol.  Meds ordered this encounter  Medications  . atenolol (TENORMIN) 50 MG tablet    Sig: TK 1 T PO QD    Refill:  0    Follow-up: Return if symptoms worsen or fail to improve.  CWilfred Lacy NP

## 2016-03-10 NOTE — Patient Instructions (Addendum)
Maintain upcoming appt with 04/01/16 with Dr. Sharlet Salina. Continue BP check once a day and record (morning is preferred). Continue current medications as prescribed.  Avoid any OTC medications with decongestant. maintain low salt diet.   Low-Sodium Eating Plan Sodium raises blood pressure and causes water to be held in the body. Getting less sodium from food will help lower your blood pressure, reduce any swelling, and protect your heart, liver, and kidneys. We get sodium by adding salt (sodium chloride) to food. Most of our sodium comes from canned, boxed, and frozen foods. Restaurant foods, fast foods, and pizza are also very high in sodium. Even if you take medicine to lower your blood pressure or to reduce fluid in your body, getting less sodium from your food is important. WHAT IS MY PLAN? Most people should limit their sodium intake to 2,300 mg a day. Your health care provider recommends that you limit your sodium intake to __________ a day.  WHAT DO I NEED TO KNOW ABOUT THIS EATING PLAN? For the low-sodium eating plan, you will follow these general guidelines:  Choose foods with a % Daily Value for sodium of less than 5% (as listed on the food label).   Use salt-free seasonings or herbs instead of table salt or sea salt.   Check with your health care provider or pharmacist before using salt substitutes.   Eat fresh foods.  Eat more vegetables and fruits.  Limit canned vegetables. If you do use them, rinse them well to decrease the sodium.   Limit cheese to 1 oz (28 g) per day.   Eat lower-sodium products, often labeled as "lower sodium" or "no salt added."  Avoid foods that contain monosodium glutamate (MSG). MSG is sometimes added to [place] food and some canned foods.  Check food labels (Nutrition Facts labels) on foods to learn how much sodium is in one serving.  Eat more home-cooked food and less restaurant, buffet, and fast food.  When eating at a restaurant,  ask that your food be prepared with less salt, or no salt if possible.  HOW DO I READ FOOD LABELS FOR SODIUM INFORMATION? The Nutrition Facts label lists the amount of sodium in one serving of the food. If you eat more than one serving, you must multiply the listed amount of sodium by the number of servings. Food labels may also identify foods as:  Sodium free--Less than 5 mg in a serving.  Very low sodium--35 mg or less in a serving.  Low sodium--140 mg or less in a serving.  Light in sodium--50% less sodium in a serving. For example, if a food that usually has 300 mg of sodium is changed to become light in sodium, it will have 150 mg of sodium.  Reduced sodium--25% less sodium in a serving. For example, if a food that usually has 400 mg of sodium is changed to reduced sodium, it will have 300 mg of sodium. WHAT FOODS CAN I EAT? Grains Low-sodium cereals, including oats, puffed wheat and rice, and shredded wheat cereals. Low-sodium crackers. Unsalted rice and pasta. Lower-sodium bread.  Vegetables Frozen or fresh vegetables. Low-sodium or reduced-sodium canned vegetables. Low-sodium or reduced-sodium tomato sauce and paste. Low-sodium or reduced-sodium tomato and vegetable juices.  Fruits Fresh, frozen, and canned fruit. Fruit juice.  Meat and Other Protein Products Low-sodium canned tuna and salmon. Fresh or frozen meat, poultry, seafood, and fish. Lamb. Unsalted nuts. Dried beans, peas, and lentils without added salt. Unsalted canned beans. Homemade soups without salt. Eggs.  Dairy Milk. Soy milk. Ricotta cheese. Low-sodium or reduced-sodium cheeses. Yogurt.  Condiments Fresh and dried herbs and spices. Salt-free seasonings. Onion and garlic powders. Low-sodium varieties of mustard and ketchup. Fresh or refrigerated horseradish. Lemon juice.  Fats and Oils Reduced-sodium salad dressings. Unsalted butter.  Other Unsalted popcorn and pretzels.  The items listed above may  not be a complete list of recommended foods or beverages. Contact your dietitian for more options. WHAT FOODS ARE NOT RECOMMENDED? Grains Instant hot cereals. Bread stuffing, pancake, and biscuit mixes. Croutons. Seasoned rice or pasta mixes. Noodle soup cups. Boxed or frozen macaroni and cheese. Self-rising flour. Regular salted crackers. Vegetables Regular canned vegetables. Regular canned tomato sauce and paste. Regular tomato and vegetable juices. Frozen vegetables in sauces. Salted [place] fries. Olives. Angie Fava. Relishes. Sauerkraut. Salsa. Meat and Other Protein Products Salted, canned, smoked, spiced, or pickled meats, seafood, or fish. Bacon, ham, sausage, hot dogs, corned beef, chipped beef, and packaged luncheon meats. Salt pork. Jerky. Pickled herring. Anchovies, regular canned tuna, and sardines. Salted nuts. Dairy Processed cheese and cheese spreads. Cheese curds. Blue cheese and cottage cheese. Buttermilk.  Condiments Onion and garlic salt, seasoned salt, table salt, and sea salt. Canned and packaged gravies. Worcestershire sauce. Tartar sauce. Barbecue sauce. Teriyaki sauce. Soy sauce, including reduced sodium. Steak sauce. Fish sauce. Oyster sauce. Cocktail sauce. Horseradish that you find on the shelf. Regular ketchup and mustard. Meat flavorings and tenderizers. Bouillon cubes. Hot sauce. Tabasco sauce. Marinades. Taco seasonings. Relishes. Fats and Oils Regular salad dressings. Salted butter. Margarine. Ghee. Bacon fat.  Other Potato and tortilla chips. Corn chips and puffs. Salted popcorn and pretzels. Canned or dried soups. Pizza. Frozen entrees and pot pies.  The items listed above may not be a complete list of foods and beverages to avoid. Contact your dietitian for more information.   This information is not intended to replace advice given to you by your health care provider. Make sure you discuss any questions you have with your health care provider.   Document  Released: 11/07/2001 Document Revised: 06/08/2014 Document Reviewed: 03/22/2013 Elsevier Interactive Patient Education [employer].

## 2016-04-01 ENCOUNTER — Ambulatory Visit (INDEPENDENT_AMBULATORY_CARE_PROVIDER_SITE_OTHER): Payer: Medicare HMO | Admitting: Internal Medicine

## 2016-04-01 ENCOUNTER — Encounter: Payer: Self-pay | Admitting: Internal Medicine

## 2016-04-01 DIAGNOSIS — I10 Essential (primary) hypertension: Secondary | ICD-10-CM | POA: Diagnosis not present

## 2016-04-01 DIAGNOSIS — J302 Other seasonal allergic rhinitis: Secondary | ICD-10-CM | POA: Diagnosis not present

## 2016-04-01 DIAGNOSIS — Z0000 Encounter for general adult medical examination without abnormal findings: Secondary | ICD-10-CM

## 2016-04-01 MED ORDER — AMLODIPINE BESYLATE 5 MG PO TABS: 5.0000 mg | ORAL_TABLET | Freq: Every day | ORAL | 3 refills | Status: DC

## 2016-04-01 NOTE — Patient Instructions (Signed)
Keep up the good work with the health.   Think about doing a little bit of exercise at the local [employer] or your church to get some new friends and some exercise.   Health Maintenance, Female Adopting a healthy lifestyle and getting preventive care can go a long way to promote health and wellness. Talk with your health care provider about what schedule of regular examinations is right for you. This is a good chance for you to check in with your provider about disease prevention and staying healthy. In between checkups, there are plenty of things you can do on your own. Experts have done a lot of research about which lifestyle changes and preventive measures are most likely to keep you healthy. Ask your health care provider for more information. WEIGHT AND DIET  Eat a healthy diet  Be sure to include plenty of vegetables, fruits, low-fat dairy products, and lean protein.  Do not eat a lot of foods high in solid fats, added sugars, or salt.  Get regular exercise. This is one of the most important things you can do for your health.  Most adults should exercise for at least 150 minutes each week. The exercise should increase your heart rate and make you sweat (moderate-intensity exercise).  Most adults should also do strengthening exercises at least twice a week. This is in addition to the moderate-intensity exercise.  Maintain a healthy weight  Body mass index (BMI) is a measurement that can be used to identify possible weight problems. It estimates body fat based on height and weight. Your health care provider can help determine your BMI and help you achieve or maintain a healthy weight.  For females 43 years of age and older:   A BMI below 18.5 is considered underweight.  A BMI of 18.5 to 24.9 is normal.  A BMI of 25 to 29.9 is considered overweight.  A BMI of 30 and above is considered obese.  Watch levels of cholesterol and blood lipids  You should start having your blood tested  for lipids and cholesterol at 77 years of age, then have this test every 5 years.  You may need to have your cholesterol levels checked more often if:  Your lipid or cholesterol levels are high.  You are older than 77 years of age.  You are at high risk for heart disease.  CANCER SCREENING   Lung Cancer  Lung cancer screening is recommended for adults 40-25 years old who are at high risk for lung cancer because of a history of smoking.  A yearly low-dose CT scan of the lungs is recommended for people who:  Currently smoke.  Have quit within the past 15 years.  Have at least a 30-pack-year history of smoking. A pack year is smoking an average of one pack of cigarettes a day for 1 year.  Yearly screening should continue until it has been 15 years since you quit.  Yearly screening should stop if you develop a health problem that would prevent you from having lung cancer treatment.  Breast Cancer  Practice breast self-awareness. This means understanding how your breasts normally appear and feel.  It also means doing regular breast self-exams. Let your health care provider know about any changes, no matter how small.  If you are in your 20s or 30s, you should have a clinical breast exam (CBE) by a health care provider every 1-3 years as part of a regular health exam.  If you are 40 or older, have  a CBE every year. Also consider having a breast X-ray (mammogram) every year.  If you have a family history of breast cancer, talk to your health care provider about genetic screening.  If you are at high risk for breast cancer, talk to your health care provider about having an MRI and a mammogram every year.  Breast cancer gene (BRCA) assessment is recommended for women who have family members with BRCA-related cancers. BRCA-related cancers include:  Breast.  Ovarian.  Tubal.  Peritoneal cancers.  Results of the assessment will determine the need for genetic counseling and  BRCA1 and BRCA2 testing. Cervical Cancer Your health care provider may recommend that you be screened regularly for cancer of the pelvic organs (ovaries, uterus, and vagina). This screening involves a pelvic examination, including checking for microscopic changes to the surface of your cervix (Pap test). You may be encouraged to have this screening done every 3 years, beginning at age 29.  For women ages 11-65, health care providers may recommend pelvic exams and Pap testing every 3 years, or they may recommend the Pap and pelvic exam, combined with testing for human papilloma virus (HPV), every 5 years. Some types of HPV increase your risk of cervical cancer. Testing for HPV may also be done on women of any age with unclear Pap test results.  Other health care providers may not recommend any screening for nonpregnant women who are considered low risk for pelvic cancer and who do not have symptoms. Ask your health care provider if a screening pelvic exam is right for you.  If you have had past treatment for cervical cancer or a condition that could lead to cancer, you need Pap tests and screening for cancer for at least 20 years after your treatment. If Pap tests have been discontinued, your risk factors (such as having a new sexual partner) need to be reassessed to determine if screening should resume. Some women have medical problems that increase the chance of getting cervical cancer. In these cases, your health care provider may recommend more frequent screening and Pap tests. Colorectal Cancer  This type of cancer can be detected and often prevented.  Routine colorectal cancer screening usually begins at 77 years of age and continues through 77 years of age.  Your health care provider may recommend screening at an earlier age if you have risk factors for colon cancer.  Your health care provider may also recommend using home test kits to check for hidden blood in the stool.  A small camera at  the end of a tube can be used to examine your colon directly (sigmoidoscopy or colonoscopy). This is done to check for the earliest forms of colorectal cancer.  Routine screening usually begins at age 26.  Direct examination of the colon should be repeated every 5-10 years through 77 years of age. However, you may need to be screened more often if early forms of precancerous polyps or small growths are found. Skin Cancer  Check your skin from head to toe regularly.  Tell your health care provider about any new moles or changes in moles, especially if there is a change in a mole's shape or color.  Also tell your health care provider if you have a mole that is larger than the size of a pencil eraser.  Always use sunscreen. Apply sunscreen liberally and repeatedly throughout the day.  Protect yourself by wearing long sleeves, pants, a wide-brimmed hat, and sunglasses whenever you are outside. HEART DISEASE, DIABETES, AND HIGH  BLOOD PRESSURE   High blood pressure causes heart disease and increases the risk of stroke. High blood pressure is more likely to develop in:  People who have blood pressure in the high end of the normal range (130-139/85-89 mm Hg).  People who are overweight or obese.  People who are African American.  If you are 18-39 years of age, have your blood pressure checked every 3-5 years. If you are 40 years of age or older, have your blood pressure checked every year. You should have your blood pressure measured twice--once when you are at a hospital or clinic, and once when you are not at a hospital or clinic. Record the average of the two measurements. To check your blood pressure when you are not at a hospital or clinic, you can use:  An automated blood pressure machine at a pharmacy.  A home blood pressure monitor.  If you are between 55 years and 79 years old, ask your health care provider if you should take aspirin to prevent strokes.  Have regular diabetes  screenings. This involves taking a blood sample to check your fasting blood sugar level.  If you are at a normal weight and have a low risk for diabetes, have this test once every three years after 77 years of age.  If you are overweight and have a high risk for diabetes, consider being tested at a younger age or more often. PREVENTING INFECTION  Hepatitis B  If you have a higher risk for hepatitis B, you should be screened for this virus. You are considered at high risk for hepatitis B if:  You were born in a country where hepatitis B is common. Ask your health care provider which countries are considered high risk.  Your parents were born in a high-risk country, and you have not been immunized against hepatitis B (hepatitis B vaccine).  You have HIV or AIDS.  You use needles to inject street drugs.  You live with someone who has hepatitis B.  You have had sex with someone who has hepatitis B.  You get hemodialysis treatment.  You take certain medicines for conditions, including cancer, organ transplantation, and autoimmune conditions. Hepatitis C  Blood testing is recommended for:  Everyone born from 1945 through 1965.  Anyone with known risk factors for hepatitis C. Sexually transmitted infections (STIs)  You should be screened for sexually transmitted infections (STIs) including gonorrhea and chlamydia if:  You are sexually active and are younger than 77 years of age.  You are older than 77 years of age and your health care provider tells you that you are at risk for this type of infection.  Your sexual activity has changed since you were last screened and you are at an increased risk for chlamydia or gonorrhea. Ask your health care provider if you are at risk.  If you do not have HIV, but are at risk, it may be recommended that you take a prescription medicine daily to prevent HIV infection. This is called pre-exposure prophylaxis (PrEP). You are considered at risk  if:  You are sexually active and do not regularly use condoms or know the HIV status of your partner(s).  You take drugs by injection.  You are sexually active with a partner who has HIV. Talk with your health care provider about whether you are at high risk of being infected with HIV. If you choose to begin PrEP, you should first be tested for HIV. You should then be tested every   3 months for as long as you are taking PrEP.  PREGNANCY   If you are premenopausal and you may become pregnant, ask your health care provider about preconception counseling.  If you may become pregnant, take 400 to 800 micrograms (mcg) of folic acid every day.  If you want to prevent pregnancy, talk to your health care provider about birth control (contraception). OSTEOPOROSIS AND MENOPAUSE   Osteoporosis is a disease in which the bones lose minerals and strength with aging. This can result in serious bone fractures. Your risk for osteoporosis can be identified using a bone density scan.  If you are 55 years of age or older, or if you are at risk for osteoporosis and fractures, ask your health care provider if you should be screened.  Ask your health care provider whether you should take a calcium or vitamin D supplement to lower your risk for osteoporosis.  Menopause may have certain physical symptoms and risks.  Hormone replacement therapy may reduce some of these symptoms and risks. Talk to your health care provider about whether hormone replacement therapy is right for you.  HOME CARE INSTRUCTIONS   Schedule regular health, dental, and eye exams.  Stay current with your immunizations.   Do not use any tobacco products including cigarettes, chewing tobacco, or electronic cigarettes.  If you are pregnant, do not drink alcohol.  If you are breastfeeding, limit how much and how often you drink alcohol.  Limit alcohol intake to no more than 1 drink per day for nonpregnant women. One drink equals 12  ounces of beer, 5 ounces of wine, or 1 ounces of hard liquor.  Do not use street drugs.  Do not share needles.  Ask your health care provider for help if you need support or information about quitting drugs.  Tell your health care provider if you often feel depressed.  Tell your health care provider if you have ever been abused or do not feel safe at home.   This information is not intended to replace advice given to you by your health care provider. Make sure you discuss any questions you have with your health care provider.   Document Released: 12/01/2010 Document Revised: 06/08/2014 Document Reviewed: 04/19/2013 Elsevier Interactive Patient Education [employer].

## 2016-04-01 NOTE — Progress Notes (Signed)
Pre visit review using our clinic review tool, if applicable. No additional management support is needed unless otherwise documented below in the visit note. 

## 2016-04-01 NOTE — Assessment & Plan Note (Signed)
BP at goal on adjusted regimen (atenolol on backorder and switched to amlodipine). Refill amlodipine and continue losartan/hctz. CMP at goal.

## 2016-04-01 NOTE — Progress Notes (Signed)
   Subjective:    Patient ID: Amy Dunn, female    DOB: 1941-04-22, 74 y.o.   MRN: [id number]  HPI Here for medicare wellness and physical, no new complaints. Please see A/P for status and treatment of chronic medical problems.   Diet: heart healthy  Physical activity: sedentary, no walking anymore Depression/mood screen: negative Hearing: intact to whispered voice Visual acuity: grossly normal, performs annual eye exam  ADLs: capable Fall risk: none Home safety: good Cognitive evaluation: intact to orientation, naming, recall and repetition EOL planning: adv directives discussed  I have personally reviewed and have noted 1. The patient's medical and social history - reviewed today no changes 2. Their use of alcohol, tobacco or illicit drugs 3. Their current medications and supplements 4. The patient's functional ability including ADL's, fall risks, home safety risks and hearing or visual impairment. 5. Diet and physical activities 6. Evidence for depression or mood disorders 7. Care team reviewed and updated (available in snapshot)  Review of Systems  Constitutional: Negative.   HENT: Negative.   Eyes: Negative.   Respiratory: Negative for cough, chest tightness and shortness of breath.   Cardiovascular: Negative for chest pain, palpitations and leg swelling.  Gastrointestinal: Negative for abdominal distention, abdominal pain, constipation, diarrhea, nausea and vomiting.  Musculoskeletal: Positive for arthralgias. Negative for back pain, gait problem and myalgias.  Skin: Negative.   Neurological: Negative.   Psychiatric/Behavioral: Negative.       Objective:   Physical Exam  Constitutional: She is oriented to person, place, and time. She appears well-developed and well-nourished.  HENT:  Head: Normocephalic and atraumatic.  Eyes: EOM are normal.  Neck: Normal range of motion.  Cardiovascular: Normal rate and regular rhythm.   Pulmonary/Chest: Effort normal and  breath sounds normal. No respiratory distress. She has no wheezes. She has no rales.  Abdominal: Soft. Bowel sounds are normal. She exhibits no distension. There is no tenderness. There is no rebound.  Musculoskeletal: She exhibits no edema.  Neurological: She is alert and oriented to person, place, and time. Coordination normal.  Skin: Skin is warm and dry.  Psychiatric: She has a normal mood and affect.   Vitals:   04/01/16 1001  BP: 130/76  Pulse: 78  Resp: 12  Temp: 98.3 F (36.8 C)  TempSrc: Oral  SpO2: 98%  Weight: 131 lb (59.4 kg)  Height: '5\' 5"'$  (1.651 m)      Assessment & Plan:

## 2016-04-01 NOTE — Assessment & Plan Note (Signed)
Taking flonase and allergra daily to prevent symptoms.

## 2016-04-01 NOTE — Assessment & Plan Note (Signed)
Recent labs reviewed with her at the visit, BP at goal on adjusted regimen. She is up to date on flu shot, tetanus shot. Declines shingles shot since still struggling with post herpetic neuralgia. Counseled on sun safety and mole surveillance as well as the dangers of distracted driving. Given 10 year screening recommendations.

## 2016-04-08 ENCOUNTER — Ambulatory Visit (HOSPITAL_COMMUNITY)
Admission: RE | Admit: 2016-04-08 | Discharge: 2016-04-08 | Disposition: A | Discharge status: Home / Self Care | Payer: Medicare HMO | Source: Ambulatory Visit | Attending: Internal Medicine | Admitting: Internal Medicine

## 2016-04-08 ENCOUNTER — Other Ambulatory Visit (HOSPITAL_BASED_OUTPATIENT_CLINIC_OR_DEPARTMENT_OTHER): Payer: Medicare HMO

## 2016-04-08 ENCOUNTER — Encounter (HOSPITAL_COMMUNITY): Payer: Self-pay

## 2016-04-08 DIAGNOSIS — C3492 Malignant neoplasm of unspecified part of left bronchus or lung: Secondary | ICD-10-CM

## 2016-04-08 DIAGNOSIS — Z9889 Other specified postprocedural states: Secondary | ICD-10-CM | POA: Insufficient documentation

## 2016-04-08 DIAGNOSIS — C3490 Malignant neoplasm of unspecified part of unspecified bronchus or lung: Secondary | ICD-10-CM | POA: Diagnosis not present

## 2016-04-08 LAB — CBC WITH DIFFERENTIAL/PLATELET
BASO%: 0.7 % (ref 0.0–2.0)
BASOS ABS: 0.1 10*3/uL (ref 0.0–0.1)
EOS%: 0.9 % (ref 0.0–7.0)
Eosinophils Absolute: 0.1 10*3/uL (ref 0.0–0.5)
HCT: 36.1 % (ref 34.8–46.6)
HGB: 12.5 g/dL (ref 11.6–15.9)
LYMPH#: 2 10*3/uL (ref 0.9–3.3)
LYMPH%: 21.3 % (ref 14.0–49.7)
MCH: 27.3 pg (ref 25.1–34.0)
MCHC: 34.7 g/dL (ref 31.5–36.0)
MCV: 78.7 fL — ABNORMAL LOW (ref 79.5–101.0)
MONO#: 0.7 10*3/uL (ref 0.1–0.9)
MONO%: 8.1 % (ref 0.0–14.0)
NEUT#: 6.4 10*3/uL (ref 1.5–6.5)
NEUT%: 69 % (ref 38.4–76.8)
PLATELETS: 191 10*3/uL (ref 145–400)
RBC: 4.59 10*6/uL (ref 3.70–5.45)
RDW: 14.6 % — ABNORMAL HIGH (ref 11.2–14.5)
WBC: 9.3 10*3/uL (ref 3.9–10.3)

## 2016-04-08 LAB — COMPREHENSIVE METABOLIC PANEL
ALT: 15 U/L (ref 0–55)
ANION GAP: 11 meq/L (ref 3–11)
AST: 18 U/L (ref 5–34)
Albumin: 3.9 g/dL (ref 3.5–5.0)
Alkaline Phosphatase: 101 U/L (ref 40–150)
BUN: 16.4 mg/dL (ref 7.0–26.0)
CALCIUM: 9.9 mg/dL (ref 8.4–10.4)
CHLORIDE: 100 meq/L (ref 98–109)
CO2: 28 meq/L (ref 22–29)
Creatinine: 1.2 mg/dL — ABNORMAL HIGH (ref 0.6–1.1)
EGFR: 52 mL/min/{1.73_m2} — ABNORMAL LOW (ref >90)
Glucose: 100 mg/dl (ref 70–140)
POTASSIUM: 3.7 meq/L (ref 3.5–5.1)
Sodium: 139 mEq/L (ref 136–145)
Total Bilirubin: 0.54 mg/dL (ref 0.20–1.20)
Total Protein: 8.2 g/dL (ref 6.4–8.3)

## 2016-04-08 MED ORDER — IOPAMIDOL (ISOVUE-300) INJECTION 61%
75.0000 mL | Freq: Once | INTRAVENOUS | Status: AC | PRN
  Administered 2016-04-08: 75 mL via INTRAVENOUS

## 2016-04-10 ENCOUNTER — Other Ambulatory Visit: Payer: Self-pay | Admitting: Internal Medicine

## 2016-04-14 ENCOUNTER — Ambulatory Visit (INDEPENDENT_AMBULATORY_CARE_PROVIDER_SITE_OTHER): Payer: Medicare HMO | Admitting: Thoracic Surgery (Cardiothoracic Vascular Surgery)

## 2016-04-14 ENCOUNTER — Encounter: Payer: Self-pay | Admitting: Thoracic Surgery (Cardiothoracic Vascular Surgery)

## 2016-04-14 VITALS — BP 132/76 | HR 88 | Resp 20 | Ht 65.0 in | Wt 130.0 lb

## 2016-04-14 DIAGNOSIS — Z902 Acquired absence of lung [part of]: Secondary | ICD-10-CM | POA: Diagnosis not present

## 2016-04-14 DIAGNOSIS — Z85118 Personal history of other malignant neoplasm of bronchus and lung: Secondary | ICD-10-CM | POA: Diagnosis not present

## 2016-04-14 NOTE — Progress Notes (Signed)
[address]       [place], [address]             [phone]       HPI: Amy Dunn returns for a scheduled 2 year follow-up visit.  Amy Dunn is a 75 year old woman who had a left lower lobe basilar segmentectomy for stage IA non-small cell carcinoma in August 2015 P last saw her in the office a year ago. She was doing well at that time with no evidence of recurrent disease.  In the interim since her last visit she has been feeling well. She has not had any respiratory problems. She does complain of left shoulder pain and swelling over the past couple of months. She wasn't able to tolerate Aleve and currently is taking tramadol for that. Her appetite is been good. She denies any weight loss. She denies any unusual headaches or visual changes.  Past Medical History:  Diagnosis Date  . Acute peptic ulcer, unspecified site, with hemorrhage and perforation, without mention of obstruction   . Cancer of left lung ([place]) 2015  . Cough   . GERD (gastroesophageal reflux disease)   . Glaucoma    right eye  . History of colonic polyps   . Hypertension   . Osteoarthritis of left shoulder   . Primary osteoarthritis of left hip   . Pulmonary nodule, left    Left Lower lobe  . Stroke [facility])    no residual  . TIA (transient ischemic attack) 2011   transient blindness, speech- resolved     Current Outpatient Prescriptions  Medication Sig Dispense Refill  . amLODipine (NORVASC) 5 MG tablet Take 1 tablet (5 mg total) by mouth daily. 90 tablet 3  . dorzolamide-timolol (COSOPT) 22.3-6.8 MG/ML ophthalmic solution Place 1 drop into the left eye daily.     . fexofenadine (ALLEGRA) 180 MG tablet Take 180 mg by mouth daily as needed. For allergies    . fluticasone (FLONASE) 50 MCG/ACT nasal spray Place 2 sprays into both nostrils daily as needed for allergies or rhinitis.    Marland Kitchen losartan-hydrochlorothiazide (HYZAAR) 100-25 MG tablet Take 1 tablet by mouth daily. 90 tablet 2  .  Omeprazole-Sodium Bicarbonate (ZEGERID OTC) 20-1100 MG CAPS capsule Take 1 capsule by mouth daily before breakfast.    . traMADol (ULTRAM) 50 MG tablet TAKE 1 TABLET BY MOUTH EVERY 6 HOURS AS NEEDED FOR MODERATE PAIN 60 tablet 3   No current facility-administered medications for this visit.     Physical Exam BP 132/76 (BP Location: Right Arm, Patient Position: Sitting, Cuff Size: Normal)   Pulse 88   Resp 20   Ht '5\' 5"'$  (1.651 m)   Wt 130 lb (59 kg)   SpO2 99% Comment: RA  BMI 21.46 kg/m  75 year old woman in no acute distress Alert and oriented 3 no focal deficits No cervical or supraclavicular adenopathy Lungs diminished at left base, otherwise clear. Cardiac regular rate and rhythm normal S1 and S2  Diagnostic Tests: CT CHEST WITH CONTRAST  TECHNIQUE: Multidetector CT imaging of the chest was performed during intravenous contrast administration.  CONTRAST:  53m ISOVUE-300 IOPAMIDOL (ISOVUE-300) INJECTION 61%  COMPARISON:  10/07/2015  FINDINGS: Cardiovascular: Heart is normal in size.  No pericardial effusion.  No evidence of thoracic aortic aneurysm.  Mediastinum/Nodes: Small higher right paratracheal lymph nodes measuring up to 6 mm short axis (series 2/ image 34), previously 5 mm.  No suspicious thoracic lymphadenopathy.  Visualized thyroid is unremarkable.  Lungs/Pleura: Postsurgical changes with volume loss in the left lower lobe, compatible with left lower lobe wedge resection.  Mild nodular scarring along the suture line.  No suspicious pulmonary nodules.  Mild biapical pleural-parenchymal scarring.  No focal consolidation.  Trace left pleural fluid.  No pneumothorax.  Upper Abdomen: Visualized upper abdomen is notable for hepatic cysts measuring up to 2.2 cm and bilateral renal cysts measuring up to 1.6 cm.  Musculoskeletal: Visualized osseous structures are within normal limits.  IMPRESSION: Postsurgical changes related  to left lower lobe wedge resection.  No evidence of recurrent or metastatic disease.   Electronically Signed   By: Julian Hy M.D.   On: 04/08/2016 11:59 I personally reviewed the CT chest and concur with the findings noted above  Impression: Amy Dunn is a 75 year old woman who had a left lower lobe basilar segmentectomy for stage IA non-small cell carcinoma 2 years ago. She had a CT last week which shows no evidence of recurrent disease.  She is having left shoulder pain. That is being evaluated and managed by Dr. Sharlet Salina. There were no suspicious findings in that area on the CT chest.  She has an appointment with Dr. Julien Nordmann tomorrow.  Plan: She will continue to follow-up with Dr. Julien Nordmann.  I will be happy to see her back any time the future if I can be of any further assistance with her care.  Amy Nakayama, MD [facility] [phone]

## 2016-04-15 ENCOUNTER — Ambulatory Visit (HOSPITAL_BASED_OUTPATIENT_CLINIC_OR_DEPARTMENT_OTHER): Payer: Medicare HMO | Admitting: Internal Medicine

## 2016-04-15 ENCOUNTER — Encounter: Payer: Self-pay | Admitting: Internal Medicine

## 2016-04-15 ENCOUNTER — Telehealth: Payer: Self-pay | Admitting: Oncology

## 2016-04-15 VITALS — BP 122/62 | HR 86 | Temp 98.3°F | Resp 18 | Ht 65.0 in | Wt 131.1 lb

## 2016-04-15 DIAGNOSIS — Z85118 Personal history of other malignant neoplasm of bronchus and lung: Secondary | ICD-10-CM | POA: Diagnosis not present

## 2016-04-15 DIAGNOSIS — C3492 Malignant neoplasm of unspecified part of left bronchus or lung: Secondary | ICD-10-CM

## 2016-04-15 NOTE — Progress Notes (Signed)
[place] Telephone:(336) [phone]   Fax:(336) [phone]  OFFICE PROGRESS NOTE  [name], MD [place] [address]  DIAGNOSIS: Stage IA (T1a, N0, M0) non-small cell lung cancer, well-differentiated adenocarcinoma diagnosed in July 2015.  PRIOR THERAPY: Status post left VATS with left lower lobe wedge resection as well as left basilar segmentectomy and lymph node dissection under the care of Dr. Roxan Hockey on 01/29/2014.  CURRENT THERAPY: Observation.  INTERVAL HISTORY: Amy Dunn 75 y.o. female returns to the clinic today for six-month follow-up visit. The patient is feeling fine today with no specific complaints. She was diagnosed with TIA recently but she is feeling much better today. She has intermittent pain in the left side of the chest and shoulder area at the site of the surgical scar. She denied having any significant shortness of breath except with exertion. She has no cough or hemoptysis. The patient denied having any significant weight loss or night sweats. She has no fever or chills. He has no nausea or vomiting. She had repeat CT scan of the chest performed recently and she is here for evaluation and discussion of her scan results.  MEDICAL HISTORY: Past Medical History:  Diagnosis Date  . Acute peptic ulcer, unspecified site, with hemorrhage and perforation, without mention of obstruction   . Cancer of left lung ([place]) 2015  . Cough   . GERD (gastroesophageal reflux disease)   . Glaucoma    right eye  . History of colonic polyps   . Hypertension   . Osteoarthritis of left shoulder   . Primary osteoarthritis of left hip   . Pulmonary nodule, left    Left Lower lobe  . Stroke [facility])    no residual  . TIA (transient ischemic attack) 2011   transient blindness, speech- resolved    ALLERGIES:  is allergic to penicillins.  MEDICATIONS:  Current Outpatient Prescriptions  Medication Sig Dispense Refill  . amLODipine  (NORVASC) 5 MG tablet Take 1 tablet (5 mg total) by mouth daily. 90 tablet 3  . dorzolamide-timolol (COSOPT) 22.3-6.8 MG/ML ophthalmic solution Place 1 drop into the left eye daily.     . fexofenadine (ALLEGRA) 180 MG tablet Take 180 mg by mouth daily as needed. For allergies    . fluticasone (FLONASE) 50 MCG/ACT nasal spray Place 2 sprays into both nostrils daily as needed for allergies or rhinitis.    Marland Kitchen losartan-hydrochlorothiazide (HYZAAR) 100-25 MG tablet Take 1 tablet by mouth daily. 90 tablet 2  . Omeprazole-Sodium Bicarbonate (ZEGERID OTC) 20-1100 MG CAPS capsule Take 1 capsule by mouth daily before breakfast.    . traMADol (ULTRAM) 50 MG tablet TAKE 1 TABLET BY MOUTH EVERY 6 HOURS AS NEEDED FOR MODERATE PAIN 60 tablet 3   No current facility-administered medications for this visit.     SURGICAL HISTORY:  Past Surgical History:  Procedure Laterality Date  . COLON SURGERY    . EYE SURGERY Bilateral    cataracts  . surgical repair of peptic ulcer    . TUBAL LIGATION    . VIDEO ASSISTED THORACOSCOPY (VATS)/WEDGE RESECTION Left 01/29/2014   Procedure: VIDEO ASSISTED THORACOSCOPY (VATS)/BASILAR SEGMENTECTOMY  LEFT LUNG LOWER LOBE WITH FROZEN SECTION, LEFT LOWER LOBECTOMY;  Surgeon: Melrose Nakayama, MD;  Location: [facility];  Service: Thoracic;  Laterality: Left;    REVIEW OF SYSTEMS:  A comprehensive review of systems was negative except for: Respiratory: positive for pleurisy/chest pain   PHYSICAL EXAMINATION: General  appearance: alert, cooperative and no distress Head: Normocephalic, without obvious abnormality, atraumatic Neck: no adenopathy, no JVD, supple, symmetrical, trachea midline and thyroid not enlarged, symmetric, no tenderness/mass/nodules Lymph nodes: Cervical, supraclavicular, and axillary nodes normal. Resp: clear to auscultation bilaterally Back: symmetric, no curvature. ROM normal. No CVA tenderness. Cardio: regular rate and rhythm, S1, S2 normal, no murmur, click,  rub or gallop GI: soft, non-tender; bowel sounds normal; no masses,  no organomegaly Extremities: extremities normal, atraumatic, no cyanosis or edema  ECOG PERFORMANCE STATUS: 1 - Symptomatic but completely ambulatory  Blood pressure 122/62, pulse 86, temperature 98.3 F (36.8 C), resp. rate 18, height '5\' 5"'$  (1.651 m), weight 131 lb 1.6 oz (59.5 kg), SpO2 100 %.  LABORATORY DATA: Lab Results  Component Value Date   WBC 9.3 04/08/2016   HGB 12.5 04/08/2016   HCT 36.1 04/08/2016   MCV 78.7 (L) 04/08/2016   PLT 191 04/08/2016      Chemistry      Component Value Date/Time   NA 139 04/08/2016 0826   K 3.7 04/08/2016 0826   CL 104 01/24/2016 0853   CO2 28 04/08/2016 0826   BUN 16.4 04/08/2016 0826   CREATININE 1.2 (H) 04/08/2016 0826      Component Value Date/Time   CALCIUM 9.9 04/08/2016 0826   ALKPHOS 101 04/08/2016 0826   AST 18 04/08/2016 0826   ALT 15 04/08/2016 0826   BILITOT 0.54 04/08/2016 0826       RADIOGRAPHIC STUDIES: Ct Chest W Contrast  Result Date: 04/08/2016 CLINICAL DATA:  Left lung cancer status post surgery in 2015 EXAM: CT CHEST WITH CONTRAST TECHNIQUE: Multidetector CT imaging of the chest was performed during intravenous contrast administration. CONTRAST:  14m ISOVUE-300 IOPAMIDOL (ISOVUE-300) INJECTION 61% COMPARISON:  10/07/2015 FINDINGS: Cardiovascular: Heart is normal in size.  No pericardial effusion. No evidence of thoracic aortic aneurysm. Mediastinum/Nodes: Small higher right paratracheal lymph nodes measuring up to 6 mm short axis (series 2/ image 34), previously 5 mm. No suspicious thoracic lymphadenopathy. Visualized thyroid is unremarkable. Lungs/Pleura: Postsurgical changes with volume loss in the left lower lobe, compatible with left lower lobe wedge resection. Mild nodular scarring along the suture line. No suspicious pulmonary nodules. Mild biapical pleural-parenchymal scarring. No focal consolidation. Trace left pleural fluid. No  pneumothorax. Upper Abdomen: Visualized upper abdomen is notable for hepatic cysts measuring up to 2.2 cm and bilateral renal cysts measuring up to 1.6 cm. Musculoskeletal: Visualized osseous structures are within normal limits. IMPRESSION: Postsurgical changes related to left lower lobe wedge resection. No evidence of recurrent or metastatic disease. Electronically Signed   By: SJulian HyM.D.   On: 04/08/2016 11:59    ASSESSMENT AND PLAN: This is a very pleasant 75years old [place] female recently diagnosed with a stage IA non-small cell lung cancer status post left lower lobe wedge resection and currently on observation. The patient is doing fine today was no specific complaints. The recent CT scan of the chest showed no evidence for disease recurrence.  I discussed the scan results with the patient and recommended for her to continue on observation with repeat CT scan of the chest in one year. She was advised to call immediately if she has any concerning symptoms in the interval. The patient voices understanding of current disease status and treatment options and is in agreement with the current care plan.  All questions were answered. The patient knows to call the clinic with any problems, questions or concerns. We can certainly see  the patient much sooner if necessary.  Disclaimer: This note was dictated with voice recognition software. Similar sounding words can inadvertently be transcribed and may not be corrected upon review.

## 2016-04-15 NOTE — Telephone Encounter (Signed)
Gave patient avs report and appointments for November.  °

## 2016-04-18 ENCOUNTER — Other Ambulatory Visit: Payer: Self-pay | Admitting: Internal Medicine

## 2016-04-29 ENCOUNTER — Ambulatory Visit (INDEPENDENT_AMBULATORY_CARE_PROVIDER_SITE_OTHER): Payer: Medicare Other | Admitting: Ophthalmology

## 2016-05-18 ENCOUNTER — Ambulatory Visit (INDEPENDENT_AMBULATORY_CARE_PROVIDER_SITE_OTHER): Payer: Medicare HMO | Admitting: Ophthalmology

## 2016-05-18 DIAGNOSIS — I10 Essential (primary) hypertension: Secondary | ICD-10-CM

## 2016-05-18 DIAGNOSIS — H35033 Hypertensive retinopathy, bilateral: Secondary | ICD-10-CM | POA: Diagnosis not present

## 2016-05-18 DIAGNOSIS — H348312 Tributary (branch) retinal vein occlusion, right eye, stable: Secondary | ICD-10-CM | POA: Diagnosis not present

## 2016-05-18 DIAGNOSIS — H43813 Vitreous degeneration, bilateral: Secondary | ICD-10-CM

## 2016-06-29 ENCOUNTER — Encounter: Payer: Self-pay | Admitting: Internal Medicine

## 2016-06-29 ENCOUNTER — Ambulatory Visit (INDEPENDENT_AMBULATORY_CARE_PROVIDER_SITE_OTHER): Payer: Medicare HMO | Admitting: Internal Medicine

## 2016-06-29 DIAGNOSIS — C3492 Malignant neoplasm of unspecified part of left bronchus or lung: Secondary | ICD-10-CM

## 2016-06-29 MED ORDER — IBUPROFEN 600 MG PO TABS: 600.0000 mg | ORAL_TABLET | Freq: Three times a day (TID) | ORAL | 3 refills | Status: DC | PRN

## 2016-06-29 NOTE — Patient Instructions (Addendum)
We have sent in the ibuprofen for you to use for pain.  Try decreasing the salt in your diet to help with the fluid.  DASH Eating Plan DASH stands for "Dietary Approaches to Stop Hypertension." The DASH eating plan is a healthy eating plan that has been shown to reduce high blood pressure (hypertension). Additional health benefits may include reducing the risk of type 2 diabetes mellitus, heart disease, and stroke. The DASH eating plan may also help with weight loss. What do I need to know about the DASH eating plan? For the DASH eating plan, you will follow these general guidelines:  Choose foods with less than 150 milligrams of sodium per serving (as listed on the food label).  Use salt-free seasonings or herbs instead of table salt or sea salt.  Check with your health care provider or pharmacist before using salt substitutes.  Eat lower-sodium products. These are often labeled as "low-sodium" or "no salt added."  Eat fresh foods. Avoid eating a lot of canned foods.  Eat more vegetables, fruits, and low-fat dairy products.  Choose whole grains. Look for the word "whole" as the first word in the ingredient list.  Choose fish and skinless chicken or [place] more often than red meat. Limit fish, poultry, and meat to 6 oz (170 g) each day.  Limit sweets, desserts, sugars, and sugary drinks.  Choose heart-healthy fats.  Eat more home-cooked food and less restaurant, buffet, and fast food.  Limit fried foods.  Do not fry foods. Cook foods using methods such as baking, boiling, grilling, and broiling instead.  When eating at a restaurant, ask that your food be prepared with less salt, or no salt if possible. What foods can I eat? Seek help from a dietitian for individual calorie needs. Grains  Whole grain or whole wheat bread. Brown rice. Whole grain or whole wheat pasta. Quinoa, bulgur, and whole grain cereals. Low-sodium cereals. Corn or whole wheat flour tortillas. Whole grain  cornbread. Whole grain crackers. Low-sodium crackers. Vegetables  Fresh or frozen vegetables (raw, steamed, roasted, or grilled). Low-sodium or reduced-sodium tomato and vegetable juices. Low-sodium or reduced-sodium tomato sauce and paste. Low-sodium or reduced-sodium canned vegetables. Fruits  All fresh, canned (in natural juice), or frozen fruits. Meat and Other Protein Products  Ground beef (85% or leaner), grass-fed beef, or beef trimmed of fat. Skinless chicken or [place]. Ground chicken or [place]. Pork trimmed of fat. All fish and seafood. Eggs. Dried beans, peas, or lentils. Unsalted nuts and seeds. Unsalted canned beans. Dairy  Low-fat dairy products, such as skim or 1% milk, 2% or reduced-fat cheeses, low-fat ricotta or cottage cheese, or plain low-fat yogurt. Low-sodium or reduced-sodium cheeses. Fats and Oils  Tub margarines without trans fats. Light or reduced-fat mayonnaise and salad dressings (reduced sodium). Avocado. Safflower, olive, or canola oils. Natural peanut or almond butter. Other  Unsalted popcorn and pretzels. The items listed above may not be a complete list of recommended foods or beverages. Contact your dietitian for more options.  What foods are not recommended? Grains  White bread. White pasta. White rice. Refined cornbread. Bagels and croissants. Crackers that contain trans fat. Vegetables  Creamed or fried vegetables. Vegetables in a cheese sauce. Regular canned vegetables. Regular canned tomato sauce and paste. Regular tomato and vegetable juices. Fruits  Canned fruit in light or heavy syrup. Fruit juice. Meat and Other Protein Products  Fatty cuts of meat. Ribs, chicken wings, bacon, sausage, bologna, salami, chitterlings, fatback, hot dogs, bratwurst, and packaged luncheon  meats. Salted nuts and seeds. Canned beans with salt. Dairy  Whole or 2% milk, cream, half-and-half, and cream cheese. Whole-fat or sweetened yogurt. Full-fat cheeses or blue cheese.  Nondairy creamers and whipped toppings. Processed cheese, cheese spreads, or cheese curds. Condiments  Onion and garlic salt, seasoned salt, table salt, and sea salt. Canned and packaged gravies. Worcestershire sauce. Tartar sauce. Barbecue sauce. Teriyaki sauce. Soy sauce, including reduced sodium. Steak sauce. Fish sauce. Oyster sauce. Cocktail sauce. Horseradish. Ketchup and mustard. Meat flavorings and tenderizers. Bouillon cubes. Hot sauce. Tabasco sauce. Marinades. Taco seasonings. Relishes. Fats and Oils  Butter, stick margarine, lard, shortening, ghee, and bacon fat. Coconut, palm kernel, or palm oils. Regular salad dressings. Other  Pickles and olives. Salted popcorn and pretzels. The items listed above may not be a complete list of foods and beverages to avoid. Contact your dietitian for more information.  Where can I find more information? National Heart, Lung, and Blood Institute: travelstabloid.com This information is not intended to replace advice given to you by your health care provider. Make sure you discuss any questions you have with your health care provider. Document Released: 05/07/2011 Document Revised: 10/24/2015 Document Reviewed: 03/22/2013 Elsevier Interactive Patient Education  2017 [employer].

## 2016-06-29 NOTE — Progress Notes (Signed)
   Subjective:    Patient ID: Amy Dunn, female    DOB: [birth year], 76 y.o.   MRN: [id number]  HPI The patient is a 76 YO coming in for left arm swelling in her pectoral region and biceps. She had surgery on her lung with several lymph nodes removed several years ago. She has had this swelling off and on since that time. She does take ibuprofen rarely for the pain. She is wanting a prescription of that today. She wants to know if there is anything she can do to reduce the swelling. No fevers or chills. No IV placement in that arm recently.   Review of Systems  Constitutional: Negative for activity change, appetite change, fatigue, fever and unexpected weight change.  Respiratory: Negative.   Cardiovascular: Positive for chest pain. Negative for palpitations and leg swelling.  Gastrointestinal: Negative.   Musculoskeletal: Negative.   Skin: Negative.   Neurological: Negative.       Objective:   Physical Exam  Constitutional: She is oriented to person, place, and time. She appears well-developed and well-nourished.  HENT:  Head: Normocephalic and atraumatic.  Cardiovascular: Normal rate and regular rhythm.   Pulmonary/Chest: Effort normal and breath sounds normal. No respiratory distress. She has no wheezes. She has no rales. She exhibits tenderness.  Left sided chest wall and flank tenderness at prior surgical site.   Abdominal: Soft.  Neurological: She is alert and oriented to person, place, and time.  Skin: Skin is warm and dry.   Vitals:   06/29/16 1030  BP: 128/80  Pulse: 83  Resp: 12  Temp: 97.5 F (36.4 C)  TempSrc: Oral  SpO2: 99%  Weight: 131 lb (59.4 kg)  Height: '5\' 5"'$  (1.651 m)      Assessment & Plan:

## 2016-06-29 NOTE — Progress Notes (Signed)
Pre visit review using our clinic review tool, if applicable. No additional management support is needed unless otherwise documented below in the visit note. 

## 2016-07-03 NOTE — Assessment & Plan Note (Signed)
Has swelling in the area of her surgery and some pain. Rx for ibuprofen 600 mg for symptoms. Talked to her about low sodium diet to help with fluid accumulation but this could be a chronic problem.

## 2016-09-14 ENCOUNTER — Ambulatory Visit (INDEPENDENT_AMBULATORY_CARE_PROVIDER_SITE_OTHER): Payer: Medicare HMO | Admitting: Internal Medicine

## 2016-09-14 ENCOUNTER — Encounter: Payer: Self-pay | Admitting: Internal Medicine

## 2016-09-14 DIAGNOSIS — J40 Bronchitis, not specified as acute or chronic: Secondary | ICD-10-CM | POA: Diagnosis not present

## 2016-09-14 MED ORDER — DOXYCYCLINE HYCLATE 100 MG PO TABS: 100.0000 mg | ORAL_TABLET | Freq: Two times a day (BID) | ORAL | 0 refills | Status: DC

## 2016-09-14 MED ORDER — HYDROCODONE-HOMATROPINE 5-1.5 MG/5ML PO SYRP: 5.0000 mL | ORAL_SOLUTION | Freq: Three times a day (TID) | ORAL | 0 refills | Status: DC | PRN

## 2016-09-14 NOTE — Progress Notes (Signed)
   Subjective:    Patient ID: Amy Dunn, female    DOB: 1941-05-29, 76 y.o.   MRN: [id number]  HPI The patient is a 76 YO female coming in for sore throat and cold symptoms. She is having fevers and chills with it. Started about 1 week ago and gradually worsening. Not improving. She started taking flonase and allegra but these have not helped for 3-4 days. She is having some SOB. She does have history of lung cancer several years ago. Denies blood in sputum. Cough minimally productive. Some nausea. No muscle aches. Some headaches and sinus pressure as well.   Review of Systems  Constitutional: Positive for activity change, appetite change, chills, fatigue and fever. Negative for unexpected weight change.  HENT: Positive for congestion, postnasal drip, rhinorrhea, sinus pain, sinus pressure and sore throat. Negative for ear discharge, ear pain and trouble swallowing.   Eyes: Negative.   Respiratory: Positive for cough and shortness of breath. Negative for chest tightness and wheezing.   Cardiovascular: Negative.   Gastrointestinal: Positive for nausea.  Musculoskeletal: Negative.   Neurological: Negative.       Objective:   Physical Exam  Constitutional: She is oriented to person, place, and time. She appears well-developed and well-nourished.  HENT:  Head: Normocephalic and atraumatic.  Oropharynx with redness, drainage, nose with crusting, sinus pressure on exam frontal.   Eyes: EOM are normal.  Neck: Normal range of motion. No JVD present.  Cardiovascular: Normal rate and regular rhythm.   Pulmonary/Chest: Effort normal and breath sounds normal.  Some scattered rhonchi which partially clear with cough  Abdominal: Soft.  Lymphadenopathy:    She has no cervical adenopathy.  Neurological: She is alert and oriented to person, place, and time.  Skin: Skin is warm and dry.   Vitals:   09/14/16 1550  BP: 130/74  Pulse: (!) 102  Resp: 12  Temp: 98.8 F (37.1 C)  TempSrc: Oral    SpO2: 98%  Weight: 133 lb (60.3 kg)  Height: '5\' 5"'$  (1.651 m)      Assessment & Plan:

## 2016-09-14 NOTE — Assessment & Plan Note (Signed)
Rx for doxycycline given time course and PE findings. Rx for hydromet cough syrup for cough. Given history of lung cancer if no resolution needs CXR.

## 2016-09-14 NOTE — Progress Notes (Signed)
Pre visit review using our clinic review tool, if applicable. No additional management support is needed unless otherwise documented below in the visit note. 

## 2016-09-14 NOTE — Patient Instructions (Signed)
We have sent in the antibiotic called doxycycline. Take 1 pill twice a day for 1 week.   We have given you the cough syrup to help with the cough.

## 2016-10-08 ENCOUNTER — Ambulatory Visit (INDEPENDENT_AMBULATORY_CARE_PROVIDER_SITE_OTHER): Payer: Medicare HMO | Admitting: Internal Medicine

## 2016-10-08 ENCOUNTER — Ambulatory Visit (INDEPENDENT_AMBULATORY_CARE_PROVIDER_SITE_OTHER)
Admission: RE | Admit: 2016-10-08 | Discharge: 2016-10-08 | Disposition: A | Discharge status: Home / Self Care | Payer: Medicare HMO | Source: Ambulatory Visit | Attending: Internal Medicine | Admitting: Internal Medicine

## 2016-10-08 ENCOUNTER — Encounter: Payer: Self-pay | Admitting: Internal Medicine

## 2016-10-08 VITALS — BP 134/80 | HR 88 | Temp 98.5°F | Resp 12 | Ht 65.0 in | Wt 131.0 lb

## 2016-10-08 DIAGNOSIS — R059 Cough, unspecified: Secondary | ICD-10-CM

## 2016-10-08 DIAGNOSIS — R05 Cough: Secondary | ICD-10-CM

## 2016-10-08 DIAGNOSIS — J40 Bronchitis, not specified as acute or chronic: Secondary | ICD-10-CM | POA: Diagnosis not present

## 2016-10-08 MED ORDER — HYDROCODONE-HOMATROPINE 5-1.5 MG/5ML PO SYRP: 5.0000 mL | ORAL_SOLUTION | Freq: Three times a day (TID) | ORAL | 0 refills | Status: DC | PRN

## 2016-10-08 MED ORDER — AZITHROMYCIN 250 MG PO TABS: ORAL_TABLET | ORAL | 0 refills | Status: DC

## 2016-10-08 NOTE — Patient Instructions (Signed)
We have sent in the antibiotic called azithromycin. Take 2 pills today, then starting tomorrow take 1 pill a day until they are gone.

## 2016-10-08 NOTE — Progress Notes (Signed)
   Subjective:    Patient ID: Amy Dunn, female    DOB: 27-Mar-1941, 76 y.o.   MRN: [id number]  HPI The patient is a 76 YO female coming in for cough with history of left lung cancer s/p removal several years ago. She has had this cough ongoing for about 2 months now. Treated with antibiotics and steroids about 1 month ago which she states did not change symptoms. Some mild allergic rhinitis symptoms but she is not taking allergy medication now as she does not feel that she needs it. Does have GERD and is taking her medication and does not have breakthrough symptoms. She denies fevers or chills. Mild sputum production which is mostly clear to slight yellow. She denies SOB with activity but is tired and this limits her some.   Review of Systems  Constitutional: Positive for activity change and fatigue. Negative for appetite change, chills, fever and unexpected weight change.  HENT: Positive for congestion. Negative for ear discharge, ear pain, sinus pain, sinus pressure, sneezing, tinnitus, trouble swallowing and voice change.   Eyes: Negative.   Respiratory: Positive for cough. Negative for choking, chest tightness, shortness of breath and wheezing.   Cardiovascular: Negative.   Gastrointestinal: Negative.   Musculoskeletal: Negative.   Neurological: Negative.       Objective:   Physical Exam  Constitutional: She is oriented to person, place, and time. She appears well-developed and well-nourished.  HENT:  Head: Normocephalic and atraumatic.  Oropharynx with mild redness, scant drainage, nose without crusting, no sinus pressure.   Eyes: EOM are normal.  Neck: Normal range of motion. No JVD present.  Cardiovascular: Normal rate and regular rhythm.   Pulmonary/Chest: Effort normal and breath sounds normal. No respiratory distress. She has no wheezes. She has no rales.  Abdominal: Soft.  Lymphadenopathy:    She has no cervical adenopathy.  Neurological: She is alert and oriented to  person, place, and time.  Skin: Skin is warm and dry.   Vitals:   10/08/16 1106  BP: 134/80  Pulse: 88  Resp: 12  Temp: 98.5 F (36.9 C)  TempSrc: Oral  SpO2: 99%  Weight: 131 lb (59.4 kg)  Height: '5\' 5"'$  (1.651 m)      Assessment & Plan:

## 2016-10-09 NOTE — Assessment & Plan Note (Signed)
Rx for azithromycin on the off chance that this is whooping cough although symptoms are not consistent. Checking CXR given her past history of lung cancer for changes. Not due for CT scan until the fall. Most recent CT Nov 17 without evidence of recurrence and reviewed with her during the visit.

## 2016-11-17 ENCOUNTER — Other Ambulatory Visit: Payer: Self-pay | Admitting: Internal Medicine

## 2016-12-03 ENCOUNTER — Telehealth: Payer: Self-pay

## 2016-12-03 NOTE — Telephone Encounter (Signed)
Patient is on the list for [employer] 2018 and may be a good candidate for an AWV. Please let me know if/when appt is scheduled.   

## 2017-01-01 ENCOUNTER — Ambulatory Visit (INDEPENDENT_AMBULATORY_CARE_PROVIDER_SITE_OTHER): Payer: Medicare HMO | Admitting: Nurse Practitioner

## 2017-01-01 ENCOUNTER — Ambulatory Visit (INDEPENDENT_AMBULATORY_CARE_PROVIDER_SITE_OTHER)
Admission: RE | Admit: 2017-01-01 | Discharge: 2017-01-01 | Disposition: A | Discharge status: Home / Self Care | Payer: Medicare HMO | Source: Ambulatory Visit | Attending: Nurse Practitioner | Admitting: Nurse Practitioner

## 2017-01-01 ENCOUNTER — Encounter: Payer: Self-pay | Admitting: Nurse Practitioner

## 2017-01-01 VITALS — BP 130/86 | HR 74 | Temp 98.5°F | Ht 65.0 in | Wt 132.0 lb

## 2017-01-01 DIAGNOSIS — R079 Chest pain, unspecified: Secondary | ICD-10-CM | POA: Diagnosis not present

## 2017-01-01 DIAGNOSIS — M25473 Effusion, unspecified ankle: Secondary | ICD-10-CM | POA: Diagnosis not present

## 2017-01-01 DIAGNOSIS — R0789 Other chest pain: Secondary | ICD-10-CM

## 2017-01-01 NOTE — Patient Instructions (Addendum)
Use compression stocking during the day and off at night. Maintain low sodium diet. Go to basement for CXR.  Alternate between tylenol and tramadol for pain.  Edema Edema is when you have too much fluid in your body or under your skin. Edema may make your legs, feet, and ankles swell up. Swelling is also common in looser tissues, like around your eyes. This is a common condition. It gets more common as you get older. There are many possible causes of edema. Eating too much salt (sodium) and being on your feet or sitting for a long time can cause edema in your legs, feet, and ankles. Hot weather may make edema worse. Edema is usually painless. Your skin may look swollen or shiny. Follow these instructions at home:  Keep the swollen body part raised (elevated) above the level of your heart when you are sitting or lying down.  Do not sit still or stand for a long time.  Do not wear tight clothes. Do not wear garters on your upper legs.  Exercise your legs. This can help the swelling go down.  Wear elastic bandages or support stockings as told by your doctor.  Eat a low-salt (low-sodium) diet to reduce fluid as told by your doctor.  Depending on the cause of your swelling, you may need to limit how much fluid you drink (fluid restriction).  Take over-the-counter and prescription medicines only as told by your doctor. Contact a doctor if:  Treatment is not working.  You have heart, liver, or kidney disease and have symptoms of edema.  You have sudden and unexplained weight gain. Get help right away if:  You have shortness of breath or chest pain.  You cannot breathe when you lie down.  You have pain, redness, or warmth in the swollen areas.  You have heart, liver, or kidney disease and get edema all of a sudden.  You have a fever and your symptoms get worse all of a sudden. Summary  Edema is when you have too much fluid in your body or under your skin.  Edema may make your  legs, feet, and ankles swell up. Swelling is also common in looser tissues, like around your eyes.  Raise (elevate) the swollen body part above the level of your heart when you are sitting or lying down.  Follow your doctor's instructions about diet and how much fluid you can drink (fluid restriction). This information is not intended to replace advice given to you by your health care provider. Make sure you discuss any questions you have with your health care provider. Document Released: 11/04/2007 Document Revised: 06/05/2016 Document Reviewed: 06/05/2016 Elsevier Interactive Patient Education  2017 [employer].

## 2017-01-01 NOTE — Progress Notes (Signed)
Subjective:  Patient ID: Amy Dunn, female    DOB: 08-Oct-1940  Age: 76 y.o. MRN: [id number]  CC: Foot Swelling (feet swelling--1 mo--toe stiff--painful)   HPI Ankle swelling (bilateral): Onset after car ride to [place]. No pain with ambulation. No foot or leg injury.  Left chest wall pain: Acute on chronic: Onset after left lobectomy several years ago. No improvement with tramadol. No SOB, no fever, no diaphoresis. Worse with palpation..  Outpatient Medications Prior to Visit  Medication Sig Dispense Refill  . amLODipine (NORVASC) 5 MG tablet Take 1 tablet (5 mg total) by mouth daily. 90 tablet 3  . azithromycin (ZITHROMAX) 250 MG tablet Day 1 take 2 pills, days 2-5 take 1 pill daily. 6 tablet 0  . dorzolamide-timolol (COSOPT) 22.3-6.8 MG/ML ophthalmic solution Place 1 drop into the left eye daily.     . fexofenadine (ALLEGRA) 180 MG tablet Take 180 mg by mouth daily as needed. For allergies    . fluticasone (FLONASE) 50 MCG/ACT nasal spray Place 2 sprays into both nostrils daily as needed for allergies or rhinitis.    Marland Kitchen HYDROcodone-homatropine (HYCODAN) 5-1.5 MG/5ML syrup Take 5 mLs by mouth every 8 (eight) hours as needed for cough. 120 mL 0  . ibuprofen (ADVIL,MOTRIN) 600 MG tablet Take 1 tablet (600 mg total) by mouth every 8 (eight) hours as needed. 60 tablet 3  . losartan-hydrochlorothiazide (HYZAAR) 100-25 MG tablet TAKE 1 TABLET BY MOUTH ONCE DAILY 90 tablet 1  . metoprolol succinate (TOPROL-XL) 50 MG 24 hr tablet TAKE 1 TABLET BY MOUTH DAILY WITH OR IMMEDIATELY FOLLOWING A MEAL 90 tablet 0  . Omeprazole-Sodium Bicarbonate (ZEGERID OTC) 20-1100 MG CAPS capsule Take 1 capsule by mouth daily before breakfast.    . traMADol (ULTRAM) 50 MG tablet TAKE 1 TABLET BY MOUTH EVERY 6 HOURS AS NEEDED FOR MODERATE PAIN 60 tablet 3   No facility-administered medications prior to visit.     ROS See HPI  Objective:  BP 130/86   Pulse 74   Temp 98.5 F (36.9 C)   Ht 5\' 5"   (1.651 m)   Wt 132 lb (59.9 kg)   SpO2 99%   BMI 21.97 kg/m   BP Readings from Last 3 Encounters:  01/01/17 130/86  10/08/16 134/80  09/14/16 130/74    Wt Readings from Last 3 Encounters:  01/01/17 132 lb (59.9 kg)  10/08/16 131 lb (59.4 kg)  09/14/16 133 lb (60.3 kg)    Physical Exam  Constitutional: Amy Dunn is oriented to person, place, and time. No distress.  Cardiovascular: Normal rate, regular rhythm and normal heart sounds.   Pulmonary/Chest: Effort normal and breath sounds normal. No respiratory distress. Amy Dunn has no wheezes. Amy Dunn has no rales. Amy Dunn exhibits tenderness.  Left lateral chest wall tenderness (along surgical incision, no sign of skin infection) No palpable mass (breast or chest wall).  Musculoskeletal: Amy Dunn exhibits no edema.       Right ankle: Normal.       Left ankle: Normal.       Right foot: Normal.       Left foot: Normal.  Neurological: Amy Dunn is alert and oriented to person, place, and time.  Skin: Skin is warm and dry. No rash noted. No erythema.  Vitals reviewed.   Lab Results  Component Value Date   WBC 9.3 04/08/2016   HGB 12.5 04/08/2016   HCT 36.1 04/08/2016   PLT 191 04/08/2016   GLUCOSE 100 04/08/2016   CHOL 212 (H) 04/01/2015  TRIG 199.0 (H) 04/01/2015   HDL 47.90 04/01/2015   LDLDIRECT 100.8 03/18/2010   LDLCALC 125 (H) 04/01/2015   ALT 15 04/08/2016   AST 18 04/08/2016   NA 139 04/08/2016   K 3.7 04/08/2016   CL 104 01/24/2016   CREATININE 1.2 (H) 04/08/2016   BUN 16.4 04/08/2016   CO2 28 04/08/2016   TSH 0.66 10/22/2011   INR 0.97 01/25/2014   HGBA1C 5.7 (H) 01/17/2012    Dg Chest 2 View  Result Date: 10/08/2016 CLINICAL DATA:  Cough for 1 month. History of hypertension. LEFT lobectomy. EXAM: CHEST  2 VIEW COMPARISON:  02/01/2015 chest radiograph.  All FINDINGS: Normal cardiomediastinal silhouette. Decreased volume LEFT hemithorax status post lobectomy. Chronic blunting LEFT CP angle. Surgical clips. RIGHT lung clear. No  nodules or hilar enlargement. No osseous findings. IMPRESSION: No active cardiopulmonary disease. Electronically Signed   By: Staci Righter M.D.   On: 10/08/2016 13:36    Assessment & Plan:   Amy Dunn was seen today for foot swelling.  Diagnoses and all orders for this visit:  Ankle swelling, unspecified laterality  Left-sided chest wall pain -     DG Chest 2 View; Future   I am having Amy Dunn maintain her fexofenadine, dorzolamide-timolol, fluticasone, Omeprazole-Sodium Bicarbonate, amLODipine, traMADol, metoprolol succinate, ibuprofen, HYDROcodone-homatropine, azithromycin, and losartan-hydrochlorothiazide.  No orders of the defined types were placed in this encounter.   Follow-up: No Follow-up on file.  Wilfred Lacy, NP

## 2017-03-03 ENCOUNTER — Ambulatory Visit: Payer: Medicare HMO | Admitting: Internal Medicine

## 2017-03-03 ENCOUNTER — Ambulatory Visit: Payer: Medicare HMO | Admitting: Family Medicine

## 2017-03-05 ENCOUNTER — Ambulatory Visit (INDEPENDENT_AMBULATORY_CARE_PROVIDER_SITE_OTHER): Payer: Medicare HMO | Admitting: Internal Medicine

## 2017-03-05 ENCOUNTER — Encounter: Payer: Self-pay | Admitting: Internal Medicine

## 2017-03-05 VITALS — BP 138/90 | HR 78 | Temp 98.3°F | Ht 65.0 in | Wt 131.0 lb

## 2017-03-05 DIAGNOSIS — Z23 Encounter for immunization: Secondary | ICD-10-CM | POA: Diagnosis not present

## 2017-03-05 DIAGNOSIS — J3089 Other allergic rhinitis: Secondary | ICD-10-CM | POA: Diagnosis not present

## 2017-03-05 MED ORDER — PREDNISONE 20 MG PO TABS: 40.0000 mg | ORAL_TABLET | Freq: Every day | ORAL | 0 refills | Status: DC

## 2017-03-05 MED ORDER — HYDROCODONE-HOMATROPINE 5-1.5 MG/5ML PO SYRP: 5.0000 mL | ORAL_SOLUTION | Freq: Three times a day (TID) | ORAL | 0 refills | Status: DC | PRN

## 2017-03-05 NOTE — Assessment & Plan Note (Signed)
Flare today, given her lung surgery in the past prednisone Rx today. Hycodan Rx for cough. Continue Allegra, Flonase. No indication for antibiotics today

## 2017-03-05 NOTE — Progress Notes (Signed)
   Subjective:    Patient ID: Amy Dunn, female    DOB: September 03, 1940, 76 y.o.   MRN: [id number]  HPI The patient is a 76 YO female coming in for cough and ear pain. Started about 1 week ago and overall improving some. She has a lot of congestion still. Denies fevers or chills. Sick contact at the onset. She does have SOB with exertion and coughing a lot at night time. Coughing up clear sputum. Nose is draining a lot as well. Has tried cough medicine over-the-counter, taking Allegra and Flonase.  Review of Systems  Constitutional: Positive for activity change, appetite change and fatigue. Negative for chills, fever and unexpected weight change.  HENT: Positive for congestion, ear pain, rhinorrhea and sinus pressure. Negative for ear discharge, postnasal drip, sinus pain, sore throat and trouble swallowing.   Eyes: Negative.   Respiratory: Positive for cough and shortness of breath. Negative for chest tightness.   Cardiovascular: Negative for chest pain, palpitations and leg swelling.  Gastrointestinal: Negative for abdominal distention, abdominal pain, constipation, diarrhea, nausea and vomiting.  Musculoskeletal: Negative.   Skin: Negative.       Objective:   Physical Exam  Constitutional: She appears well-developed and well-nourished.  HENT:  Head: Normocephalic and atraumatic.  Oropharynx with erythema, clear drainage, nasal turbinates swollen.  Eyes: Pupils are equal, round, and reactive to light. EOM are normal.  Neck: Normal range of motion. No JVD present.  Cardiovascular: Normal rate and regular rhythm.   Pulmonary/Chest: Effort normal and breath sounds normal.  Abdominal: Soft.  Lymphadenopathy:    She has no cervical adenopathy.  Skin: Skin is warm and dry.   Vitals:   03/05/17 1104  BP: 138/90  Pulse: 78  Temp: 98.3 F (36.8 C)  TempSrc: Oral  SpO2: 99%  Weight: 131 lb (59.4 kg)  Height: 5\' 5"  (1.651 m)      Assessment & Plan:  Flu shot given at visit

## 2017-03-05 NOTE — Patient Instructions (Signed)
We have given you the cough medicine today.  We have also sent in the prednisone for you. Take 2 pills daily for 5 days to help this get better faster.   Keep taking the allegra and flonase.

## 2017-03-07 ENCOUNTER — Other Ambulatory Visit: Payer: Self-pay | Admitting: Internal Medicine

## 2017-03-08 NOTE — Telephone Encounter (Signed)
Faxed to [facility] on [address]

## 2017-04-09 ENCOUNTER — Other Ambulatory Visit (HOSPITAL_BASED_OUTPATIENT_CLINIC_OR_DEPARTMENT_OTHER): Payer: Medicare HMO

## 2017-04-09 ENCOUNTER — Ambulatory Visit (HOSPITAL_COMMUNITY)
Admission: RE | Admit: 2017-04-09 | Discharge: 2017-04-09 | Disposition: A | Discharge status: Home / Self Care | Payer: Medicare HMO | Source: Ambulatory Visit | Attending: Internal Medicine | Admitting: Internal Medicine

## 2017-04-09 ENCOUNTER — Encounter (HOSPITAL_COMMUNITY): Payer: Self-pay

## 2017-04-09 DIAGNOSIS — C3492 Malignant neoplasm of unspecified part of left bronchus or lung: Secondary | ICD-10-CM | POA: Diagnosis present

## 2017-04-09 DIAGNOSIS — Z9889 Other specified postprocedural states: Secondary | ICD-10-CM | POA: Diagnosis not present

## 2017-04-09 LAB — CBC WITH DIFFERENTIAL/PLATELET
BASO%: 0.9 % (ref 0.0–2.0)
Basophils Absolute: 0.1 10*3/uL (ref 0.0–0.1)
EOS%: 1.6 % (ref 0.0–7.0)
Eosinophils Absolute: 0.1 10*3/uL (ref 0.0–0.5)
HEMATOCRIT: 31.9 % — AB (ref 34.8–46.6)
HEMOGLOBIN: 11.1 g/dL — AB (ref 11.6–15.9)
LYMPH#: 1.6 10*3/uL (ref 0.9–3.3)
LYMPH%: 23.7 % (ref 14.0–49.7)
MCH: 27.9 pg (ref 25.1–34.0)
MCHC: 34.7 g/dL (ref 31.5–36.0)
MCV: 80.2 fL (ref 79.5–101.0)
MONO#: 0.7 10*3/uL (ref 0.1–0.9)
MONO%: 10.2 % (ref 0.0–14.0)
NEUT#: 4.4 10*3/uL (ref 1.5–6.5)
NEUT%: 63.6 % (ref 38.4–76.8)
PLATELETS: 205 10*3/uL (ref 145–400)
RBC: 3.98 10*6/uL (ref 3.70–5.45)
RDW: 14.5 % (ref 11.2–14.5)
WBC: 6.9 10*3/uL (ref 3.9–10.3)

## 2017-04-09 LAB — COMPREHENSIVE METABOLIC PANEL
ALK PHOS: 95 U/L (ref 40–150)
ALT: 12 U/L (ref 0–55)
ANION GAP: 9 meq/L (ref 3–11)
AST: 16 U/L (ref 5–34)
Albumin: 3.7 g/dL (ref 3.5–5.0)
BILIRUBIN TOTAL: 0.58 mg/dL (ref 0.20–1.20)
BUN: 18.7 mg/dL (ref 7.0–26.0)
CO2: 29 meq/L (ref 22–29)
Calcium: 9.4 mg/dL (ref 8.4–10.4)
Chloride: 101 mEq/L (ref 98–109)
Creatinine: 1.3 mg/dL — ABNORMAL HIGH (ref 0.6–1.1)
EGFR: 45 mL/min/{1.73_m2} — ABNORMAL LOW (ref >60)
Glucose: 97 mg/dl (ref 70–140)
POTASSIUM: 3.7 meq/L (ref 3.5–5.1)
SODIUM: 139 meq/L (ref 136–145)
TOTAL PROTEIN: 7.6 g/dL (ref 6.4–8.3)

## 2017-04-09 MED ORDER — IOPAMIDOL (ISOVUE-300) INJECTION 61%
75.0000 mL | Freq: Once | INTRAVENOUS | Status: AC | PRN
  Administered 2017-04-09: 75 mL via INTRAVENOUS

## 2017-04-09 MED ORDER — IOPAMIDOL (ISOVUE-300) INJECTION 61%
INTRAVENOUS | Status: AC
  Filled 2017-04-09: qty 75

## 2017-04-13 ENCOUNTER — Other Ambulatory Visit: Payer: Self-pay | Admitting: Internal Medicine

## 2017-04-14 ENCOUNTER — Telehealth: Payer: Self-pay | Admitting: Internal Medicine

## 2017-04-14 ENCOUNTER — Ambulatory Visit (HOSPITAL_BASED_OUTPATIENT_CLINIC_OR_DEPARTMENT_OTHER): Payer: Medicare HMO | Admitting: Internal Medicine

## 2017-04-14 ENCOUNTER — Encounter: Payer: Self-pay | Admitting: Internal Medicine

## 2017-04-14 DIAGNOSIS — Z85118 Personal history of other malignant neoplasm of bronchus and lung: Secondary | ICD-10-CM | POA: Diagnosis not present

## 2017-04-14 DIAGNOSIS — C3490 Malignant neoplasm of unspecified part of unspecified bronchus or lung: Secondary | ICD-10-CM

## 2017-04-14 NOTE — Progress Notes (Signed)
[place] [phone]   [phone]  OFFICE PROGRESS NOTE  [name], MD [place] [address]  DIAGNOSIS: Stage IA (T1a, N0, M0) non-small cell lung cancer, well-differentiated adenocarcinoma diagnosed in July 2015.  PRIOR THERAPY: Status post left VATS with left lower lobe wedge resection as well as left basilar segmentectomy and lymph node dissection under the care of Dr. Roxan Hockey on 01/29/2014.  CURRENT THERAPY: Observation.  INTERVAL HISTORY: Amy Dunn 76 y.o. female returns to the clinic today for follow-up visit.  The patient is feeling fine today with no specific complaints except for dry cough.  She is currently on Hycodan by her primary care physician.  She denied having any chest pain, shortness of breath or hemoptysis.  She denied having any recent weight loss or night sweats.  She has no nausea, vomiting, diarrhea or constipation.  The patient had repeat CT scan of the chest performed recently and she is here for evaluation and discussion of the scan results.   MEDICAL HISTORY: Past Medical History:  Diagnosis Date  . Acute peptic ulcer, unspecified site, with hemorrhage and perforation, without mention of obstruction   . Cancer of left lung ([place]) 2015  . Cough   . GERD (gastroesophageal reflux disease)   . Glaucoma    right eye  . History of colonic polyps   . Hypertension   . Osteoarthritis of left shoulder   . Primary osteoarthritis of left hip   . Pulmonary nodule, left    Left Lower lobe  . Stroke [facility])    no residual  . TIA (transient ischemic attack) 2011   transient blindness, speech- resolved    ALLERGIES:  is allergic to penicillins.  MEDICATIONS:  Current Outpatient Medications  Medication Sig Dispense Refill  . amLODipine (NORVASC) 5 MG tablet Take 1 tablet (5 mg total) daily by mouth. Need an annual visit for further refills 90 tablet 0  . dorzolamide-timolol (COSOPT)  22.3-6.8 MG/ML ophthalmic solution Place 1 drop into the left eye daily.     . fexofenadine (ALLEGRA) 180 MG tablet Take 180 mg by mouth daily as needed. For allergies    . fluticasone (FLONASE) 50 MCG/ACT nasal spray Place 2 sprays into both nostrils daily as needed for allergies or rhinitis.    Marland Kitchen HYDROcodone-homatropine (HYCODAN) 5-1.5 MG/5ML syrup Take 5 mLs by mouth every 8 (eight) hours as needed for cough. 120 mL 0  . ibuprofen (ADVIL,MOTRIN) 600 MG tablet Take 1 tablet (600 mg total) by mouth every 8 (eight) hours as needed. 60 tablet 3  . losartan-hydrochlorothiazide (HYZAAR) 100-25 MG tablet Take 1 tablet daily by mouth. Need an annual visit for further refills 90 tablet 0  . metoprolol succinate (TOPROL-XL) 50 MG 24 hr tablet TAKE 1 TABLET BY MOUTH DAILY WITH OR IMMEDIATELY FOLLOWING A MEAL 90 tablet 0  . Omeprazole-Sodium Bicarbonate (ZEGERID OTC) 20-1100 MG CAPS capsule Take 1 capsule by mouth daily before breakfast.    . predniSONE (DELTASONE) 20 MG tablet Take 2 tablets (40 mg total) by mouth daily with breakfast. 10 tablet 0  . traMADol (ULTRAM) 50 MG tablet TAKE 1 TABLET BY MOUTH EVERY 6 HOURS AS NEEDED FOR  MODERATE  PAIN 60 tablet 1   No current facility-administered medications for this visit.     SURGICAL HISTORY:  Past Surgical History:  Procedure Laterality Date  . COLON SURGERY    . EYE SURGERY Bilateral    cataracts  .  surgical repair of peptic ulcer    . TUBAL LIGATION      REVIEW OF SYSTEMS:  A comprehensive review of systems was negative except for: Respiratory: positive for cough   PHYSICAL EXAMINATION: General appearance: alert, cooperative and no distress Head: Normocephalic, without obvious abnormality, atraumatic Neck: no adenopathy, no JVD, supple, symmetrical, trachea midline and thyroid not enlarged, symmetric, no tenderness/mass/nodules Lymph nodes: Cervical, supraclavicular, and axillary nodes normal. Resp: clear to auscultation bilaterally Back:  symmetric, no curvature. ROM normal. No CVA tenderness. Cardio: regular rate and rhythm, S1, S2 normal, no murmur, click, rub or gallop GI: soft, non-tender; bowel sounds normal; no masses,  no organomegaly Extremities: extremities normal, atraumatic, no cyanosis or edema  ECOG PERFORMANCE STATUS: 1 - Symptomatic but completely ambulatory  Blood pressure 125/72, pulse 67, temperature 98.6 F (37 C), temperature source Oral, resp. rate 18, height 5\' 5"  (1.651 m), weight 136 lb 3.2 oz (61.8 kg), SpO2 99 %.  LABORATORY DATA: Lab Results  Component Value Date   WBC 6.9 04/09/2017   HGB 11.1 (L) 04/09/2017   HCT 31.9 (L) 04/09/2017   MCV 80.2 04/09/2017   PLT 205 04/09/2017      Chemistry      Component Value Date/Time   NA 139 04/09/2017 0927   K 3.7 04/09/2017 0927   CL 104 01/24/2016 0853   CO2 29 04/09/2017 0927   BUN 18.7 04/09/2017 0927   CREATININE 1.3 (H) 04/09/2017 0927      Component Value Date/Time   CALCIUM 9.4 04/09/2017 0927   ALKPHOS 95 04/09/2017 0927   AST 16 04/09/2017 0927   ALT 12 04/09/2017 0927   BILITOT 0.58 04/09/2017 0927       RADIOGRAPHIC STUDIES: Ct Chest W Contrast  Result Date: 04/09/2017 CLINICAL DATA:  Left lower lobectomy for lung cancer. EXAM: CT CHEST WITH CONTRAST TECHNIQUE: Multidetector CT imaging of the chest was performed during intravenous contrast administration. CONTRAST:  41mL ISOVUE-300 IOPAMIDOL (ISOVUE-300) INJECTION 61% COMPARISON:  04/08/2016. FINDINGS: Cardiovascular: The heart size is normal. No pericardial effusion. No thoracic aortic aneurysm. Mediastinum/Nodes: No mediastinal lymphadenopathy. There is no hilar lymphadenopathy. Surgical changes are noted in the left hilum. The esophagus has normal imaging features. There is no axillary lymphadenopathy. Lungs/Pleura: Right lung remains clear. Volume loss left hemithorax compatible with previous surgery. Scarring in the left parahilar lower lung is stable. No new pulmonary  nodule or mass. Upper Abdomen: Stable appearance multiple hepatic cysts. 7 mm hypervascular lesions central liver is unchanged. Incompletely visualized intra and extrahepatic biliary duct dilatation appears similar to prior. Bilateral renal lesions of varying attenuation are again noted, incompletely visualized. Musculoskeletal: Bone windows reveal no worrisome lytic or sclerotic osseous lesions. IMPRESSION: 1. Stable exam. Postsurgical change left hemithorax. No new or progressive findings to suggest recurrent or metastatic disease. Electronically Signed   By: Misty Stanley M.D.   On: 04/09/2017 14:32    ASSESSMENT AND PLAN: This is a very pleasant 76 years old [place] American female recently diagnosed with a stage IA non-small cell lung cancer status post left lower lobe wedge resection and currently on observation.  The patient has no complaints today. Her recent CT scan of the chest showed no evidence for disease recurrence or progression. I discussed the scan results with the patient and recommended for her to continue on observation with a repeat CT scan of the chest in 1 year. She was advised to call immediately if she has any concerning symptoms in the interval. The patient  voices understanding of current disease status and treatment options and is in agreement with the current care plan.  All questions were answered. The patient knows to call the clinic with any problems, questions or concerns. We can certainly see the patient much sooner if necessary.  Disclaimer: This note was dictated with voice recognition software. Similar sounding words can inadvertently be transcribed and may not be corrected upon review.

## 2017-04-14 NOTE — Telephone Encounter (Signed)
Scheduled appt per 11/14 los - Gave patient AVS and calender per los.

## 2017-04-27 ENCOUNTER — Telehealth: Payer: Self-pay | Admitting: Internal Medicine

## 2017-04-27 NOTE — Telephone Encounter (Signed)
04/27/2017 faxed most recent progress notes, labs and imaging to [phone] @ [facility]

## 2017-05-19 ENCOUNTER — Ambulatory Visit (INDEPENDENT_AMBULATORY_CARE_PROVIDER_SITE_OTHER): Payer: Medicare HMO | Admitting: Ophthalmology

## 2017-05-24 ENCOUNTER — Ambulatory Visit (INDEPENDENT_AMBULATORY_CARE_PROVIDER_SITE_OTHER): Payer: Medicare HMO | Admitting: Internal Medicine

## 2017-05-24 ENCOUNTER — Encounter: Payer: Self-pay | Admitting: Internal Medicine

## 2017-05-24 VITALS — BP 120/84 | HR 80 | Temp 98.2°F | Ht 65.0 in | Wt 132.0 lb

## 2017-05-24 DIAGNOSIS — I10 Essential (primary) hypertension: Secondary | ICD-10-CM

## 2017-05-24 DIAGNOSIS — M159 Polyosteoarthritis, unspecified: Secondary | ICD-10-CM

## 2017-05-24 DIAGNOSIS — M150 Primary generalized (osteo)arthritis: Secondary | ICD-10-CM

## 2017-05-24 DIAGNOSIS — Z0000 Encounter for general adult medical examination without abnormal findings: Secondary | ICD-10-CM

## 2017-05-24 DIAGNOSIS — M8949 Other hypertrophic osteoarthropathy, multiple sites: Secondary | ICD-10-CM

## 2017-05-24 MED ORDER — AMLODIPINE BESYLATE 5 MG PO TABS: 5.0000 mg | ORAL_TABLET | Freq: Every day | ORAL | 3 refills | Status: DC

## 2017-05-24 MED ORDER — METOPROLOL SUCCINATE ER 50 MG PO TB24: ORAL_TABLET | ORAL | 3 refills | Status: DC

## 2017-05-24 MED ORDER — LOSARTAN POTASSIUM-HCTZ 100-25 MG PO TABS: 1.0000 | ORAL_TABLET | Freq: Every day | ORAL | 3 refills | Status: DC

## 2017-05-24 NOTE — Assessment & Plan Note (Addendum)
BP at goal on her losartan/hctz and metoprolol and amlodipine. Refilled today and recent BMP without indication for change.

## 2017-05-24 NOTE — Progress Notes (Signed)
   Subjective:    Patient ID: Amy Dunn, female    DOB: Oct 31, 1940, 76 y.o.   MRN: [id number]  HPI Here for medicare wellness and physical, no new complaints. Please see A/P for status and treatment of chronic medical problems.   Diet: heart healthy  Physical activity: sedentary Depression/mood screen: negative Hearing: intact to whispered voice, mild loss bilaterally Visual acuity: grossly normal, performs annual eye exam  ADLs: capable Fall risk: none Home safety: good Cognitive evaluation: intact to orientation, naming, recall and repetition EOL planning: adv directives discussed  I have personally reviewed and have noted 1. The patient's medical and social history - reviewed today no changes 2. Their use of alcohol, tobacco or illicit drugs 3. Their current medications and supplements 4. The patient's functional ability including ADL's, fall risks, home safety risks and hearing or visual impairment. 5. Diet and physical activities 6. Evidence for depression or mood disorders 7. Care team reviewed and updated (available in snapshot)  Review of Systems  Constitutional: Negative.   HENT: Negative.   Eyes: Negative.   Respiratory: Negative for cough, chest tightness and shortness of breath.   Cardiovascular: Negative for chest pain, palpitations and leg swelling.  Gastrointestinal: Negative for abdominal distention, abdominal pain, constipation, diarrhea, nausea and vomiting.  Musculoskeletal: Negative.   Skin: Negative.   Neurological: Negative.   Psychiatric/Behavioral: Negative.       Objective:   Physical Exam  Constitutional: She is oriented to person, place, and time. She appears well-developed and well-nourished.  HENT:  Head: Normocephalic and atraumatic.  Eyes: EOM are normal.  Neck: Normal range of motion.  Cardiovascular: Normal rate and regular rhythm.  Pulmonary/Chest: Effort normal and breath sounds normal. No respiratory distress. She has no wheezes.  She has no rales.  Abdominal: Soft. Bowel sounds are normal. She exhibits no distension. There is no tenderness. There is no rebound.  Musculoskeletal: She exhibits no edema.  Neurological: She is alert and oriented to person, place, and time. Coordination normal.  Skin: Skin is warm and dry.  Psychiatric: She has a normal mood and affect.   Vitals:   05/24/17 1008  BP: 120/84  Pulse: 80  Temp: 98.2 F (36.8 C)  TempSrc: Oral  SpO2: 99%  Weight: 132 lb (59.9 kg)  Height: 5\' 5"  (1.651 m)      Assessment & Plan:

## 2017-05-24 NOTE — Assessment & Plan Note (Signed)
Flu and tetanus and pneumonia up to date. Counseled about shingrix. Colonoscopy due soon and last with [facility] and we do not have those records. She will contact them for her due date. Counseled about sun safety and mole surveillance. Given 10 year screening recommendations. Counseled about dangers of distracted driving.

## 2017-05-24 NOTE — Assessment & Plan Note (Signed)
Taking tramadol max 2 pills daily for arthritis and tries to use tylenol when not severe.

## 2017-05-24 NOTE — Patient Instructions (Signed)

## 2017-05-26 ENCOUNTER — Telehealth: Payer: Self-pay | Admitting: Internal Medicine

## 2017-05-26 MED ORDER — AMLODIPINE BESYLATE 5 MG PO TABS: 5.0000 mg | ORAL_TABLET | Freq: Every day | ORAL | 3 refills | Status: DC

## 2017-05-26 NOTE — Telephone Encounter (Signed)
Copied from [place] [id number]. Topic: Quick Communication - See Telephone Encounter >> May 26, 2017  9:09 AM Bea Graff, NT wrote: CRM for notification. See Telephone encounter for: Pt states pharmacy did not receive the amlodipine rx that was sent on Monday. Please advise. [facility] [address]   05/26/17.

## 2017-05-26 NOTE — Telephone Encounter (Signed)
Resent rx to [facility] 

## 2017-06-16 ENCOUNTER — Encounter (INDEPENDENT_AMBULATORY_CARE_PROVIDER_SITE_OTHER): Payer: Medicare HMO | Admitting: Ophthalmology

## 2017-06-16 DIAGNOSIS — I10 Essential (primary) hypertension: Secondary | ICD-10-CM

## 2017-06-16 DIAGNOSIS — H348312 Tributary (branch) retinal vein occlusion, right eye, stable: Secondary | ICD-10-CM

## 2017-06-16 DIAGNOSIS — H26492 Other secondary cataract, left eye: Secondary | ICD-10-CM | POA: Diagnosis not present

## 2017-06-16 DIAGNOSIS — H35033 Hypertensive retinopathy, bilateral: Secondary | ICD-10-CM | POA: Diagnosis not present

## 2017-06-16 DIAGNOSIS — H43813 Vitreous degeneration, bilateral: Secondary | ICD-10-CM | POA: Diagnosis not present

## 2017-07-22 ENCOUNTER — Other Ambulatory Visit: Payer: Self-pay | Admitting: Internal Medicine

## 2017-07-22 NOTE — Telephone Encounter (Signed)
Control database checked last refill: 05/23/2017 LOV: 05/24/2017

## 2017-07-23 ENCOUNTER — Telehealth: Payer: Self-pay | Admitting: Internal Medicine

## 2017-07-23 NOTE — Telephone Encounter (Signed)
Returned call, pharmacy needed to know if medication was being taken for a chronic or acute reason. Can only do a 5 day supply for an acute reason. Informed pharmacy it was for a chronic reason

## 2017-07-23 NOTE — Telephone Encounter (Signed)
Copied from [place] [id number]. Topic: General - Other >> Jul 23, 2017 10:51 AM Darl Householder, RMA wrote: Reason for CRM: Please return call to [address] concerning clarification on medication traMADol (ULTRAM) 50 MG tablet

## 2017-08-17 ENCOUNTER — Ambulatory Visit (INDEPENDENT_AMBULATORY_CARE_PROVIDER_SITE_OTHER): Payer: Medicare HMO | Admitting: Internal Medicine

## 2017-08-17 ENCOUNTER — Encounter: Payer: Self-pay | Admitting: Internal Medicine

## 2017-08-17 DIAGNOSIS — J069 Acute upper respiratory infection, unspecified: Secondary | ICD-10-CM | POA: Diagnosis not present

## 2017-08-17 DIAGNOSIS — B9789 Other viral agents as the cause of diseases classified elsewhere: Secondary | ICD-10-CM

## 2017-08-17 MED ORDER — BENZONATATE 200 MG PO CAPS: 200.0000 mg | ORAL_CAPSULE | Freq: Three times a day (TID) | ORAL | 0 refills | Status: DC | PRN

## 2017-08-17 NOTE — Patient Instructions (Signed)
We have sent in the tessalon perles for cough that you can use up to 3 times per day.    Viral Respiratory Infection A viral respiratory infection is an illness that affects parts of the body used for breathing, like the lungs, nose, and throat. It is caused by a germ called a virus. Some examples of this kind of infection are:  A cold.  The flu (influenza).  A respiratory syncytial virus (RSV) infection.  How do I know if I have this infection? Most of the time this infection causes:  A stuffy or runny nose.  Yellow or green fluid in the nose.  A cough.  Sneezing.  Tiredness (fatigue).  Achy muscles.  A sore throat.  Sweating or chills.  A fever.  A headache.  How is this infection treated? If the flu is diagnosed early, it may be treated with an antiviral medicine. This medicine shortens the length of time a person has symptoms. Symptoms may be treated with over-the-counter and prescription medicines, such as:  Expectorants. These make it easier to cough up mucus.  Decongestant nasal sprays.  Doctors do not prescribe antibiotic medicines for viral infections. They do not work with this kind of infection. How do I know if I should stay home? To keep others from getting sick, stay home if you have:  A fever.  A lasting cough.  A sore throat.  A runny nose.  Sneezing.  Muscles aches.  Headaches.  Tiredness.  Weakness.  Chills.  Sweating.  An upset stomach (nausea).  Follow these instructions at home:  Rest as much as possible.  Take over-the-counter and prescription medicines only as told by your doctor.  Drink enough fluid to keep your pee (urine) clear or pale yellow.  Gargle with salt water. Do this 3-4 times per day or as needed. To make a salt-water mixture, dissolve -1 tsp of salt in 1 cup of warm water. Make sure the salt dissolves all the way.  Use nose drops made from salt water. This helps with stuffiness (congestion). It also  helps soften the skin around your nose.  Do not drink alcohol.  Do not use tobacco products, including cigarettes, chewing tobacco, and e-cigarettes. If you need help quitting, ask your doctor. Get help if:  Your symptoms last for 10 days or longer.  Your symptoms get worse over time.  You have a fever.  You have very bad pain in your face or forehead.  Parts of your jaw or neck become very swollen. Get help right away if:  You feel pain or pressure in your chest.  You have shortness of breath.  You faint or feel like you will faint.  You keep throwing up (vomiting).  You feel confused. This information is not intended to replace advice given to you by your health care provider. Make sure you discuss any questions you have with your health care provider. Document Released: 04/30/2008 Document Revised: 10/24/2015 Document Reviewed: 10/24/2014 Elsevier Interactive Patient Education  2018 [employer].

## 2017-08-17 NOTE — Progress Notes (Signed)
   Subjective:    Patient ID: Amy Dunn, female    DOB: 07/05/1940, 77 y.o.   MRN: [id number]  HPI The patient is a 77 YO female coming in for cough and cold symptoms for about 5 days. Started with drainage and cough. Denies much SOB. She does have some chronic mild SOB from prior lung surgery. She is coughing up some white sputum. Overall symptoms are mildly improving. She denies fevers or chills. Denies known sick contacts. Denies muscle aches. Mild headaches. Taking allegra and flonase as well as tylenol cold.   Review of Systems  Constitutional: Negative for activity change, appetite change, chills, fatigue, fever and unexpected weight change.  HENT: Positive for congestion, postnasal drip, rhinorrhea and sinus pressure. Negative for ear discharge, ear pain, sinus pain, sneezing, sore throat, tinnitus, trouble swallowing and voice change.   Eyes: Negative.   Respiratory: Positive for cough. Negative for chest tightness, shortness of breath and wheezing.   Cardiovascular: Negative.   Gastrointestinal: Negative.   Musculoskeletal: Positive for myalgias.  Neurological: Negative.       Objective:   Physical Exam  Constitutional: She is oriented to person, place, and time. She appears well-developed and well-nourished.  HENT:  Head: Normocephalic and atraumatic.  Oropharynx with redness and clear drainage, nose with swollen turbinates, TMs normal bilaterally  Eyes: EOM are normal.  Neck: Normal range of motion. No thyromegaly present.  Cardiovascular: Normal rate and regular rhythm.  Pulmonary/Chest: Effort normal and breath sounds normal. No respiratory distress. She has no wheezes. She has no rales.  Abdominal: Soft.  Lymphadenopathy:    She has no cervical adenopathy.  Neurological: She is alert and oriented to person, place, and time.  Skin: Skin is warm and dry.   Vitals:   08/17/17 0948  BP: 120/70  Pulse: 95  Temp: 97.8 F (36.6 C)  TempSrc: Oral  SpO2: 98%  Weight:  135 lb (61.2 kg)  Height: 5\' 5"  (1.651 m)      Assessment & Plan:

## 2017-08-17 NOTE — Assessment & Plan Note (Signed)
Rx for tessalon perles for cough. Call back in 3-4 days if not improved. No indication for antibiotics or steroids today.

## 2017-10-28 ENCOUNTER — Encounter: Payer: Self-pay | Admitting: Internal Medicine

## 2017-10-28 ENCOUNTER — Ambulatory Visit (INDEPENDENT_AMBULATORY_CARE_PROVIDER_SITE_OTHER): Payer: Medicare HMO | Admitting: Internal Medicine

## 2017-10-28 ENCOUNTER — Other Ambulatory Visit (INDEPENDENT_AMBULATORY_CARE_PROVIDER_SITE_OTHER): Payer: Medicare HMO

## 2017-10-28 VITALS — BP 110/66 | HR 102 | Temp 98.3°F | Ht 65.0 in | Wt 131.0 lb

## 2017-10-28 DIAGNOSIS — I10 Essential (primary) hypertension: Secondary | ICD-10-CM | POA: Diagnosis not present

## 2017-10-28 DIAGNOSIS — Z13220 Encounter for screening for lipoid disorders: Secondary | ICD-10-CM

## 2017-10-28 LAB — LDL CHOLESTEROL, DIRECT: LDL DIRECT: 102 mg/dL

## 2017-10-28 LAB — LIPID PANEL
CHOL/HDL RATIO: 5
Cholesterol: 200 mg/dL (ref 0–200)
HDL: 40.9 mg/dL (ref >39.00)
NONHDL: 158.93
Triglycerides: 281 mg/dL — ABNORMAL HIGH (ref 0.0–149.0)
VLDL: 56.2 mg/dL — AB (ref 0.0–40.0)

## 2017-10-28 NOTE — Patient Instructions (Signed)
We will check the cholesterol today.

## 2017-10-28 NOTE — Progress Notes (Signed)
   Subjective:    Patient ID: Amy Dunn, female    DOB: 04-23-1941, 77 y.o.   MRN: [id number]  HPI The patient is a 77 YO female coming in for follow up of her cholesterol levels. She has been trying to change diet recently and wants to know if her cholesterol is better. She is not taking any cholesterol medicine. She does exercise some but not much. She denies chest pains, SOB, headaches, stroke symptoms.   Review of Systems  Constitutional: Negative.   HENT: Negative.   Eyes: Negative.   Respiratory: Negative for cough, chest tightness and shortness of breath.   Cardiovascular: Negative for chest pain, palpitations and leg swelling.  Gastrointestinal: Negative for abdominal distention, abdominal pain, constipation, diarrhea, nausea and vomiting.  Musculoskeletal: Negative.   Skin: Negative.   Neurological: Negative.   Psychiatric/Behavioral: Negative.       Objective:   Physical Exam  Constitutional: She is oriented to person, place, and time. She appears well-developed and well-nourished.  HENT:  Head: Normocephalic and atraumatic.  Eyes: EOM are normal.  Neck: Normal range of motion.  Cardiovascular: Normal rate and regular rhythm.  Pulmonary/Chest: Effort normal and breath sounds normal. No respiratory distress. She has no wheezes. She has no rales.  Abdominal: Soft. Bowel sounds are normal. She exhibits no distension. There is no tenderness. There is no rebound.  Musculoskeletal: She exhibits no edema.  Neurological: She is alert and oriented to person, place, and time. Coordination normal.  Skin: Skin is warm and dry.  Psychiatric: She has a normal mood and affect.   Vitals:   10/28/17 1332  BP: 110/66  Pulse: (!) 102  Temp: 98.3 F (36.8 C)  TempSrc: Oral  SpO2: 97%  Weight: 131 lb (59.4 kg)  Height: 5\' 5"  (1.651 m)      Assessment & Plan:

## 2017-10-28 NOTE — Assessment & Plan Note (Signed)
BP at goal on her metoprolol, losartan/hctz and amlodipine. Checking lipid panel for screening.

## 2017-11-18 ENCOUNTER — Ambulatory Visit (INDEPENDENT_AMBULATORY_CARE_PROVIDER_SITE_OTHER): Payer: Medicare HMO | Admitting: Internal Medicine

## 2017-11-18 ENCOUNTER — Encounter: Payer: Self-pay | Admitting: Internal Medicine

## 2017-11-18 DIAGNOSIS — J019 Acute sinusitis, unspecified: Secondary | ICD-10-CM | POA: Insufficient documentation

## 2017-11-18 DIAGNOSIS — J0110 Acute frontal sinusitis, unspecified: Secondary | ICD-10-CM | POA: Diagnosis not present

## 2017-11-18 MED ORDER — AZITHROMYCIN 250 MG PO TABS: ORAL_TABLET | ORAL | 0 refills | Status: DC

## 2017-11-18 NOTE — Patient Instructions (Signed)
We have sent in azithromycin, take 2 pills daily. Starting tomorrow take 1 pill daily.

## 2017-11-18 NOTE — Assessment & Plan Note (Signed)
Rx for azithromycin. Take allegra and continue. PCN allergy. Overall symptoms worsening over the last 2 weeks.

## 2017-11-18 NOTE — Progress Notes (Signed)
   Subjective:    Patient ID: Amy Dunn, female    DOB: 1941-03-03, 77 y.o.   MRN: [id number]  HPI The patient is a 77 YO female coming in for sinus problems. Having severe pain in her sinuses for the last 3 days and increasing drainage and sinus symptoms for about 1-2 weeks. She is having nausea and vomiting from post nasal drip. Denies fevers but some chills. Does have cough and soreness from coughing. Not productive. Overall worsening.   Review of Systems  Constitutional: Positive for activity change, appetite change and chills. Negative for fatigue, fever and unexpected weight change.  HENT: Positive for congestion, postnasal drip, rhinorrhea and sinus pressure. Negative for ear discharge, ear pain, sinus pain, sneezing, sore throat, tinnitus, trouble swallowing and voice change.   Eyes: Negative.   Respiratory: Positive for cough. Negative for chest tightness, shortness of breath and wheezing.   Cardiovascular: Negative.   Gastrointestinal: Negative.   Musculoskeletal: Positive for myalgias.  Neurological: Negative.       Objective:   Physical Exam  Constitutional: She is oriented to person, place, and time. She appears well-developed and well-nourished.  HENT:  Head: Normocephalic and atraumatic.  Oropharynx with redness and clear drainage, nose with swollen turbinates, TMs normal bilaterally  Eyes: EOM are normal.  Neck: Normal range of motion. No thyromegaly present.  Cardiovascular: Normal rate and regular rhythm.  Pulmonary/Chest: Effort normal and breath sounds normal. No respiratory distress. She has no wheezes. She has no rales.  Some mild rhonchi  Abdominal: Soft.  Musculoskeletal: She exhibits tenderness.  Lymphadenopathy:    She has no cervical adenopathy.  Neurological: She is alert and oriented to person, place, and time.  Skin: Skin is warm and dry.   Vitals:   11/18/17 1102  BP: 102/70  Pulse: (!) 103  Temp: 98 F (36.7 C)  TempSrc: Oral  SpO2: 95%    Weight: 130 lb (59 kg)  Height: 5\' 5"  (1.651 m)      Assessment & Plan:

## 2017-11-22 ENCOUNTER — Ambulatory Visit: Payer: Medicare HMO | Admitting: Emergency Medicine

## 2017-11-22 ENCOUNTER — Encounter: Payer: Self-pay | Admitting: Emergency Medicine

## 2017-11-22 VITALS — BP 110/74 | HR 78 | Wt 131.0 lb

## 2017-11-22 DIAGNOSIS — R05 Cough: Secondary | ICD-10-CM

## 2017-11-22 DIAGNOSIS — J3089 Other allergic rhinitis: Secondary | ICD-10-CM | POA: Diagnosis not present

## 2017-11-22 DIAGNOSIS — K219 Gastro-esophageal reflux disease without esophagitis: Secondary | ICD-10-CM | POA: Diagnosis not present

## 2017-11-22 DIAGNOSIS — R053 Chronic cough: Secondary | ICD-10-CM

## 2017-11-22 DIAGNOSIS — C3492 Malignant neoplasm of unspecified part of left bronchus or lung: Secondary | ICD-10-CM

## 2017-11-22 NOTE — Patient Instructions (Signed)
Please continue Allegra once daily Please continue Flonase nasal spray, 2 sprays each nostril once a day. Try starting chlorpheniramine 4 mg up to every 6 hours if needed for allergy drainage.  This medicine is available over-the-counter. Try using nasal saline rinses once daily. We will perform pulmonary function testing when you return for your follow-up visit. Depending on your response to these medications you may ultimately be referred for allergy testing. Continue your prilosec once a day Follow-up with Dr. Elsworth Soho in 1 month or next available with full PFT on the same day

## 2017-11-22 NOTE — Assessment & Plan Note (Signed)
With presumed contributions from GERD (appears to be well controlled) and allergic rhinitis which is not well controlled at this time.  She needs plenty function testing to evaluate for occult obstruction

## 2017-11-22 NOTE — Assessment & Plan Note (Signed)
Please continue Allegra once daily Please continue Flonase nasal spray, 2 sprays each nostril once a day. Try starting chlorpheniramine 4 mg up to every 6 hours if needed for allergy drainage.  This medicine is available over-the-counter. Try using nasal saline rinses once daily. Depending on your response to these medications you may ultimately be referred for allergy testing.

## 2017-11-22 NOTE — Assessment & Plan Note (Signed)
No longer on Zegerid, currently well controlled on Prilosec once daily

## 2017-11-22 NOTE — Assessment & Plan Note (Signed)
On observation, followed by Dr. Julien Nordmann

## 2017-11-22 NOTE — Progress Notes (Signed)
Subjective:    Patient ID: Amy Dunn, female    DOB: 1940-07-04, 77 y.o.   MRN: [id number]  HPI 77 year old former smoker who is been followed in our office by Dr. Elsworth Soho for cough that was felt to be upper airway in nature with contribution of GERD.  She also has a history of stage I a non-small cell lung cancer That underwent left lower lobe wedge resection.   She is here today concerned about increased nasal congestion, drainage. Leads to more cough. She is bringing up thick white mucous.  She is on prilosec and denies any increase in GERD sx. Used to be on zegerid. She is on flonase and allegra. She cannot tolerate sudafed.   Review of Systems  Past Medical History:  Diagnosis Date  . Acute peptic ulcer, unspecified site, with hemorrhage and perforation, without mention of obstruction   . Cancer of left lung ([place]) 2015  . Cough   . GERD (gastroesophageal reflux disease)   . Glaucoma    right eye  . History of colonic polyps   . Hypertension   . Osteoarthritis of left shoulder   . Primary osteoarthritis of left hip   . Pulmonary nodule, left    Left Lower lobe  . Stroke [facility])    no residual  . TIA (transient ischemic attack) 2011   transient blindness, speech- resolved     Family History  Problem Relation Age of Onset  . Hypertension Mother   . Heart disease Mother   . Heart attack Mother   . Cancer Father        Colon cancer     Social History   Socioeconomic History  . Marital status: Married    Spouse name: Not on file  . Number of children: Not on file  . Years of education: Not on file  . Highest education level: Not on file  Occupational History  . Not on file  Social Needs  . Financial resource strain: Not on file  . Food insecurity:    Worry: Not on file    Inability: Not on file  . Transportation needs:    Medical: Not on file    Non-medical: Not on file  Tobacco Use  . Smoking status: Former Smoker    Years: 1.00    Types: Cigarettes   Last attempt to quit: 05/26/1981    Years since quitting: 36.5  . Smokeless tobacco: Never Used  . Tobacco comment: 2 ciggs per day  Substance and Sexual Activity  . Alcohol use: No  . Drug use: No  . Sexual activity: Not on file  Lifestyle  . Physical activity:    Days per week: Not on file    Minutes per session: Not on file  . Stress: Not on file  Relationships  . Social connections:    Talks on phone: Not on file    Gets together: Not on file    Attends religious service: Not on file    Active member of club or organization: Not on file    Attends meetings of clubs or organizations: Not on file    Relationship status: Not on file  . Intimate partner violence:    Fear of current or ex partner: Not on file    Emotionally abused: Not on file    Physically abused: Not on file    Forced sexual activity: Not on file  Other Topics Concern  . Not on file  Social History Narrative  HSG   Married- '62   2 daughters- '65, '70; 3 grandchildren   Occupation retired- Lawyer of life: No CPR, no intubation, no heroic measures (pt provided end of life packet)     Allergies  Allergen Reactions  . Penicillins Itching     Outpatient Medications Prior to Visit  Medication Sig Dispense Refill  . amLODipine (NORVASC) 5 MG tablet Take 1 tablet (5 mg total) by mouth daily. 90 tablet 3  . dorzolamide-timolol (COSOPT) 22.3-6.8 MG/ML ophthalmic solution Place 1 drop into the left eye daily.     . fexofenadine (ALLEGRA) 180 MG tablet Take 180 mg by mouth daily as needed. For allergies    . fluticasone (FLONASE) 50 MCG/ACT nasal spray Place 2 sprays into both nostrils daily as needed for allergies or rhinitis.    Marland Kitchen ibuprofen (ADVIL,MOTRIN) 600 MG tablet Take 1 tablet (600 mg total) by mouth every 8 (eight) hours as needed. 60 tablet 3  . losartan-hydrochlorothiazide (HYZAAR) 100-25 MG tablet Take 1 tablet by mouth daily. 90 tablet 3  . metoprolol succinate (TOPROL-XL) 50 MG 24 hr  tablet TAKE 1 TABLET BY MOUTH DAILY WITH OR IMMEDIATELY FOLLOWING A MEAL 90 tablet 3  . omeprazole (PRILOSEC) 20 MG capsule Take 20 mg by mouth daily.    . traMADol (ULTRAM) 50 MG tablet TAKE 1 TABLET BY MOUTH EVERY 6 HOURS AS NEEDED FOR MODERATE PAIN 60 tablet 1  . Omeprazole-Sodium Bicarbonate (ZEGERID OTC) 20-1100 MG CAPS capsule Take 1 capsule by mouth daily before breakfast.    . azithromycin (ZITHROMAX) 250 MG tablet Day 1 take 2 pills daily, days 2-5 take 1 pill daily. 6 tablet 0   No facility-administered medications prior to visit.        Objective:   Physical Exam Vitals:   11/22/17 1540  BP: 110/74  Pulse: 78  SpO2: 98%  Weight: 131 lb (59.4 kg)   Gen: Pleasant, elder woman, well-nourished, in no distress,  normal affect  ENT: No lesions,  mouth clear,  oropharynx clear, no postnasal drip, poor dentition  Neck: No JVD, no stridor  Lungs: No use of accessory muscles, clear B, no wheeze  Cardiovascular: RRR, heart sounds normal, no murmur or gallops, no peripheral edema  Musculoskeletal: No deformities, no cyanosis or clubbing  Neuro: alert, non focal  Skin: Warm, no lesions or rash      Assessment & Plan:  Allergic rhinitis Please continue Allegra once daily Please continue Flonase nasal spray, 2 sprays each nostril once a day. Try starting chlorpheniramine 4 mg up to every 6 hours if needed for allergy drainage.  This medicine is available over-the-counter. Try using nasal saline rinses once daily. Depending on your response to these medications you may ultimately be referred for allergy testing.  Adenocarcinoma of lung, stage 1 On observation, followed by Dr. Brent Bulla E REFLUX No longer on Zegerid, currently well controlled on Prilosec once daily  Chronic cough With presumed contributions from GERD (appears to be well controlled) and allergic rhinitis which is not well controlled at this time.  She needs plenty function testing to evaluate for occult  obstruction  Baltazar Apo, MD, PhD 11/22/2017, 4:00 PM [facility] [phone] or if no answer [phone]

## 2017-11-29 ENCOUNTER — Other Ambulatory Visit: Payer: Self-pay | Admitting: Internal Medicine

## 2017-11-30 NOTE — Telephone Encounter (Signed)
Control database checked last refill: 10/06/2017  LOV: acute 11/18/2017, CPE 05/24/2017

## 2017-12-23 ENCOUNTER — Encounter: Payer: Self-pay | Admitting: Pulmonary Disease

## 2017-12-23 ENCOUNTER — Ambulatory Visit: Payer: Medicare HMO | Admitting: Pulmonary Disease

## 2017-12-23 VITALS — BP 116/70 | HR 79 | Ht 65.0 in | Wt 128.8 lb

## 2017-12-23 DIAGNOSIS — K219 Gastro-esophageal reflux disease without esophagitis: Secondary | ICD-10-CM

## 2017-12-23 DIAGNOSIS — R05 Cough: Secondary | ICD-10-CM

## 2017-12-23 DIAGNOSIS — R053 Chronic cough: Secondary | ICD-10-CM

## 2017-12-23 MED ORDER — PROMETHAZINE-CODEINE 6.25-10 MG/5ML PO SYRP: 5.0000 mL | ORAL_SOLUTION | Freq: Four times a day (QID) | ORAL | 0 refills | Status: DC | PRN

## 2017-12-23 NOTE — Assessment & Plan Note (Addendum)
Schedule esophagram for reflux/stricture  Take Prilosec twice daily. Take over-the-counter store brand Sudafed/phenylephrine daily a.m. for 2 weeks  Promethazine/codeine cough syrup 5 mL at bedtime #180 ml -discussed adverse effects of codeine, this was the only thing that provided relief when given by her PCP If Persistent, will change to Zegerid -it seems to have worked in the past

## 2017-12-23 NOTE — Patient Instructions (Signed)
Schedule esophagram for reflux.  Take Prilosec twice daily. Take over-the-counter store brand Sudafed/phenylephrine daily a.m. for 2 weeks  Promethazine/codeine cough syrup 5 mL at bedtime #180 ml

## 2017-12-23 NOTE — Progress Notes (Signed)
   Subjective:    Patient ID: Amy Dunn, female    DOB: 1940/10/08, 77 y.o.   MRN: [id number]  HPI  77 yo ex smoker for FU of lung CA & chronic cough since 07/2008 attributed to upper airway cough/ GERD . She underwent left lower lobe wedge resection in 12/2013 She smoked for 25 Pyrs before quitting in 1980.  Chief Complaint  Patient presents with  . Cough    Patient thinks cough may be allergy related. Cough is productive, white in color.     She has done well overall and undergoes annual cancer screening with Dr. Julien Nordmann, last CT from 04/2017 was reviewed which appears to be normal except left lower lobe scarring. She reports worsening of her chronic cough since early summer.  She saw my partner 10/2017 and was given over-the-counter Chlor-Trimeton, Allegra and Flonase for allergic rhinitis.  Which she reports that cough is persistent. Has reflux occasionally. Review of prior imaging studies shows mildly dilated distal esophagus   Breathing is good overall, no wheezing or recurrent chest colds  Significant tests/ events reviewed  LES was dilated - CT abdomen 3/13 showed -Air and fluid within dilated distal esophagus s/o  distal esophageal stricture   Head CT 10/2012 - sinuses clear, rt parietal calcified meningioma  01/29/2014 -stage IA (T18, N0, M0) non-small cell lung cancer, well-differentiated adenocarcinoma measuring 1.2 cm in size status post left lower lobe wedge resection with lymph node dissection.   07/2009 allergy panel nml.   01/2009- Spirometry showed flattening of expiratory limb but no evidence of airway obstruction.  05/2013 PFTs - no airway obstruction -mild restriction, TLC 80%, DLCO 70%   Review of Systems neg for any significant sore throat, dysphagia, itching, sneezing, nasal congestion or excess/ purulent secretions, fever, chills, sweats, unintended wt loss, pleuritic or exertional cp, hempoptysis, orthopnea pnd or change in chronic leg swelling. Also  denies presyncope, palpitations, heartburn, abdominal pain, nausea, vomiting, diarrhea or change in bowel or urinary habits, dysuria,hematuria, rash, arthralgias, visual complaints, headache, numbness weakness or ataxia.     Objective:   Physical Exam  Gen. Pleasant, well-nourished, in no distress ENT - no thrush, no post nasal drip Neck: No JVD, no thyromegaly, no carotid bruits Lungs: no use of accessory muscles, no dullness to percussion, clear without rales or rhonchi  Cardiovascular: Rhythm regular, heart sounds  normal, no murmurs or gallops, no peripheral edema Musculoskeletal: No deformities, no cyanosis or clubbing        Assessment & Plan:

## 2018-01-05 ENCOUNTER — Ambulatory Visit (HOSPITAL_COMMUNITY)
Admission: RE | Admit: 2018-01-05 | Discharge: 2018-01-05 | Disposition: A | Discharge status: Home / Self Care | Payer: Medicare HMO | Source: Ambulatory Visit | Attending: Pulmonary Disease | Admitting: Pulmonary Disease

## 2018-01-05 DIAGNOSIS — K224 Dyskinesia of esophagus: Secondary | ICD-10-CM | POA: Diagnosis not present

## 2018-01-05 DIAGNOSIS — K219 Gastro-esophageal reflux disease without esophagitis: Secondary | ICD-10-CM | POA: Insufficient documentation

## 2018-01-05 DIAGNOSIS — K210 Gastro-esophageal reflux disease with esophagitis: Secondary | ICD-10-CM | POA: Diagnosis not present

## 2018-02-23 ENCOUNTER — Encounter: Payer: Self-pay | Admitting: Nurse Practitioner

## 2018-02-23 ENCOUNTER — Ambulatory Visit (INDEPENDENT_AMBULATORY_CARE_PROVIDER_SITE_OTHER): Payer: Medicare HMO | Admitting: Nurse Practitioner

## 2018-02-23 VITALS — BP 110/70 | HR 86 | Temp 98.3°F | Ht 65.0 in | Wt 129.0 lb

## 2018-02-23 DIAGNOSIS — H6690 Otitis media, unspecified, unspecified ear: Secondary | ICD-10-CM

## 2018-02-23 MED ORDER — AZITHROMYCIN 250 MG PO TABS: ORAL_TABLET | ORAL | 0 refills | Status: DC

## 2018-02-23 NOTE — Progress Notes (Signed)
Name: Amy Dunn   MRN: [id number]    DOB: 15-Jul-1940   Date:02/23/2018       Progress Note  Subjective  Chief Complaint  Chief Complaint  Patient presents with  . Ear Pain    x 1 day  . Dizziness    x 1 day  . Sinusitis    x 1 week    HPI Ms Amy Dunn is here today requesting evaluation of acute complaint of ear pain, sinus pressure and dizziness.  She reports last week she began to develop symptoms of cold/URI-chills, nasal congestion, cough, which have improved, however this week began to notice right ear pain and pressure, facial pressure, nausea, dizziness.  She has been maintained on flonase and allegra daily for some time for allergies, which she has continued She denies syncope, confusion, vision changes, slurred speech, chest pain, shortness of breath, abdominal pain, vomiting.  Patient Active Problem List   Diagnosis Date Noted  . Postmenopausal bleeding 07/17/2015  . Chronic cough 03/24/2013  . Routine general medical examination at a health care facility 03/14/2013  . Benign meningioma of brain ([place]) 11/03/2012  . Allergic rhinitis 10/10/2012  . TIA (transient ischemic attack) 01/17/2012  . Osteoarthritis 03/06/2010  . Adenocarcinoma of lung, stage 1 ([place]) 02/17/2010  . Essential hypertension 01/30/2009  . G E REFLUX 01/30/2009    Social History   Tobacco Use  . Smoking status: Former Smoker    Years: 1.00    Types: Cigarettes    Last attempt to quit: 05/26/1981    Years since quitting: 36.7  . Smokeless tobacco: Never Used  . Tobacco comment: 2 ciggs per day  Substance Use Topics  . Alcohol use: No     Current Outpatient Medications:  .  amLODipine (NORVASC) 5 MG tablet, Take 1 tablet (5 mg total) by mouth daily., Disp: 90 tablet, Rfl: 3 .  dorzolamide-timolol (COSOPT) 22.3-6.8 MG/ML ophthalmic solution, Place 1 drop into the left eye daily. , Disp: , Rfl:  .  fexofenadine (ALLEGRA) 180 MG tablet, Take 180 mg by mouth daily as needed. For allergies,  Disp: , Rfl:  .  fluticasone (FLONASE) 50 MCG/ACT nasal spray, Place 2 sprays into both nostrils daily as needed for allergies or rhinitis., Disp: , Rfl:  .  ibuprofen (ADVIL,MOTRIN) 600 MG tablet, Take 1 tablet (600 mg total) by mouth every 8 (eight) hours as needed., Disp: 60 tablet, Rfl: 3 .  losartan-hydrochlorothiazide (HYZAAR) 100-25 MG tablet, Take 1 tablet by mouth daily., Disp: 90 tablet, Rfl: 3 .  metoprolol succinate (TOPROL-XL) 50 MG 24 hr tablet, TAKE 1 TABLET BY MOUTH DAILY WITH OR IMMEDIATELY FOLLOWING A MEAL, Disp: 90 tablet, Rfl: 3 .  omeprazole (PRILOSEC) 20 MG capsule, Take 20 mg by mouth daily., Disp: , Rfl:  .  promethazine-codeine (PHENERGAN WITH CODEINE) 6.25-10 MG/5ML syrup, Take 5 mLs by mouth every 6 (six) hours as needed for cough., Disp: 180 mL, Rfl: 0 .  traMADol (ULTRAM) 50 MG tablet, TAKE 1 TABLET BY MOUTH EVERY 6 HOURS AS NEEDED FOR MODERATE PAIN, Disp: 60 tablet, Rfl: 1  Allergies  Allergen Reactions  . Penicillins Itching    ROS  No other specific complaints in a complete review of systems (except as listed in HPI above).  Objective  Vitals:   02/23/18 1423  BP: 110/70  Pulse: 86  Temp: 98.3 F (36.8 C)  TempSrc: Oral  SpO2: 95%  Weight: 129 lb (58.5 kg)  Height: 5\' 5"  (1.651 m)  Body mass index is 21.47 kg/m.  Nursing Note and Vital Signs reviewed.  Physical Exam  Constitutional: Patient appears well-developed and well-nourished. No distress.  HEENT: head atraumatic, normocephalic, pupils equal and reactive to light, EOM's intact, TM's without erythema or bulging, right ear canal erythematous, scant cerumen to left ear canal, no maxillary or frontal sinus tenderness , neck supple without lymphadenopathy, oropharynx pink and moist without exudate Cardiovascular: Normal rate, regular rhythm, distal pulses intact. Pulmonary/Chest: Effort normal and breath sounds clear. No respiratory distress or retractions. Neurological: She is alert and  oriented to person, place, and time. No cranial nerve deficit. Coordination, balance, strength, speech and gait are normal.  Skin: Skin is warm and dry. No rash noted. No erythema.  Psychiatric: Patient has a normal mood and affect. behavior is normal. Judgment and thought content normal.   Assessment & Plan  1. Acute otitis media, unspecified otitis media type Due to pcn allergy will start course of azithromycin-dosing and side effects discussed  -home management, Red flags and when to present for emergency care or RTC including fever >101.4F, new/worsening/un-resolving symptoms, reviewed with patient at time of visit. Follow up and care instructions discussed and provided in AVS. - azithromycin (ZITHROMAX) 250 MG tablet; 2 tablets today, then 1 tablet daily until complete  Dispense: 6 tablet; Refill: 0

## 2018-02-23 NOTE — Patient Instructions (Signed)
I have sent a prescription for an antibiotic to your pharmacy, please take as prescribed  Please follow up for fevers over 101, if your symptoms get worse, or if your symptoms dont get better with the antibiotic.   Otitis Media, Adult Otitis media is redness, soreness, and puffiness (swelling) in the space just behind your eardrum (middle ear). It may be caused by allergies or infection. It often happens along with a cold. Follow these instructions at home:  Take your medicine as told. Finish it even if you start to feel better.  Only take over-the-counter or prescription medicines for pain, discomfort, or fever as told by your doctor.  Follow up with your doctor as told. Contact a doctor if:  You have otitis media only in one ear, or bleeding from your nose, or both.  You notice a lump on your neck.  You are not getting better in 3-5 days.  You feel worse instead of better. Get help right away if:  You have pain that is not helped with medicine.  You have puffiness, redness, or pain around your ear.  You get a stiff neck.  You cannot move part of your face (paralysis).  You notice that the bone behind your ear hurts when you touch it. This information is not intended to replace advice given to you by your health care provider. Make sure you discuss any questions you have with your health care provider. Document Released: 11/04/2007 Document Revised: 10/24/2015 Document Reviewed: 12/13/2012 Elsevier Interactive Patient Education  2017 [employer].

## 2018-03-15 ENCOUNTER — Ambulatory Visit: Payer: Medicare HMO

## 2018-03-30 ENCOUNTER — Ambulatory Visit (INDEPENDENT_AMBULATORY_CARE_PROVIDER_SITE_OTHER): Payer: Medicare HMO | Admitting: *Deleted

## 2018-03-30 DIAGNOSIS — Z23 Encounter for immunization: Secondary | ICD-10-CM

## 2018-04-08 ENCOUNTER — Ambulatory Visit (HOSPITAL_COMMUNITY)
Admission: RE | Admit: 2018-04-08 | Discharge: 2018-04-08 | Disposition: A | Discharge status: Home / Self Care | Payer: Medicare HMO | Source: Ambulatory Visit | Attending: Internal Medicine | Admitting: Internal Medicine

## 2018-04-08 ENCOUNTER — Inpatient Hospital Stay: Payer: Medicare HMO | Attending: Internal Medicine

## 2018-04-08 DIAGNOSIS — R079 Chest pain, unspecified: Secondary | ICD-10-CM | POA: Diagnosis not present

## 2018-04-08 DIAGNOSIS — I700 Atherosclerosis of aorta: Secondary | ICD-10-CM | POA: Insufficient documentation

## 2018-04-08 DIAGNOSIS — C3490 Malignant neoplasm of unspecified part of unspecified bronchus or lung: Secondary | ICD-10-CM | POA: Diagnosis not present

## 2018-04-08 DIAGNOSIS — Z902 Acquired absence of lung [part of]: Secondary | ICD-10-CM | POA: Insufficient documentation

## 2018-04-08 DIAGNOSIS — Z85118 Personal history of other malignant neoplasm of bronchus and lung: Secondary | ICD-10-CM | POA: Insufficient documentation

## 2018-04-08 LAB — CBC WITH DIFFERENTIAL/PLATELET
Abs Immature Granulocytes: 0.03 10*3/uL (ref 0.00–0.07)
BASOS ABS: 0.1 10*3/uL (ref 0.0–0.1)
Basophils Relative: 1 %
EOS ABS: 0.1 10*3/uL (ref 0.0–0.5)
EOS PCT: 1 %
HEMATOCRIT: 31.2 % — AB (ref 36.0–46.0)
HEMOGLOBIN: 11 g/dL — AB (ref 12.0–15.0)
Immature Granulocytes: 0 %
LYMPHS PCT: 27 %
Lymphs Abs: 1.9 10*3/uL (ref 0.7–4.0)
MCH: 27.6 pg (ref 26.0–34.0)
MCHC: 35.3 g/dL (ref 30.0–36.0)
MCV: 78.4 fL — ABNORMAL LOW (ref 80.0–100.0)
MONO ABS: 0.6 10*3/uL (ref 0.1–1.0)
Monocytes Relative: 9 %
Neutro Abs: 4.4 10*3/uL (ref 1.7–7.7)
Neutrophils Relative %: 62 %
Platelets: 205 10*3/uL (ref 150–400)
RBC: 3.98 MIL/uL (ref 3.87–5.11)
RDW: 13.6 % (ref 11.5–15.5)
WBC: 7 10*3/uL (ref 4.0–10.5)
nRBC: 0 % (ref 0.0–0.2)

## 2018-04-08 LAB — COMPREHENSIVE METABOLIC PANEL
ALK PHOS: 94 U/L (ref 38–126)
ALT: 8 U/L (ref 0–44)
AST: 15 U/L (ref 15–41)
Albumin: 4 g/dL (ref 3.5–5.0)
Anion gap: 10 (ref 5–15)
BUN: 22 mg/dL (ref 8–23)
CALCIUM: 9.5 mg/dL (ref 8.9–10.3)
CO2: 28 mmol/L (ref 22–32)
Chloride: 102 mmol/L (ref 98–111)
Creatinine, Ser: 1.56 mg/dL — ABNORMAL HIGH (ref 0.44–1.00)
GFR calc Af Amer: 35 mL/min — ABNORMAL LOW (ref >60)
GFR calc non Af Amer: 30 mL/min — ABNORMAL LOW (ref >60)
GLUCOSE: 87 mg/dL (ref 70–99)
Potassium: 4 mmol/L (ref 3.5–5.1)
SODIUM: 140 mmol/L (ref 135–145)
Total Bilirubin: 0.5 mg/dL (ref 0.3–1.2)
Total Protein: 7.8 g/dL (ref 6.5–8.1)

## 2018-04-11 ENCOUNTER — Telehealth: Payer: Self-pay | Admitting: Internal Medicine

## 2018-04-11 ENCOUNTER — Inpatient Hospital Stay: Payer: Medicare HMO | Admitting: Internal Medicine

## 2018-04-11 ENCOUNTER — Encounter: Payer: Self-pay | Admitting: Internal Medicine

## 2018-04-11 VITALS — BP 127/70 | HR 75 | Temp 98.2°F | Resp 18 | Ht 65.0 in | Wt 131.3 lb

## 2018-04-11 DIAGNOSIS — C3490 Malignant neoplasm of unspecified part of unspecified bronchus or lung: Secondary | ICD-10-CM

## 2018-04-11 DIAGNOSIS — C3492 Malignant neoplasm of unspecified part of left bronchus or lung: Secondary | ICD-10-CM

## 2018-04-11 DIAGNOSIS — R079 Chest pain, unspecified: Secondary | ICD-10-CM | POA: Diagnosis not present

## 2018-04-11 DIAGNOSIS — Z85118 Personal history of other malignant neoplasm of bronchus and lung: Secondary | ICD-10-CM | POA: Diagnosis not present

## 2018-04-11 NOTE — Telephone Encounter (Signed)
Scheduled appt per 11/11 los - gave patient AVS and calender per los.

## 2018-04-11 NOTE — Progress Notes (Signed)
[place] [phone]   [phone]  OFFICE PROGRESS NOTE  [name], MD [place] [address]  DIAGNOSIS: Stage IA (T1a, N0, M0) non-small cell lung cancer, well-differentiated adenocarcinoma diagnosed in July 2015.  PRIOR THERAPY: Status post left VATS with left lower lobe wedge resection as well as left basilar segmentectomy and lymph node dissection under the care of Dr. Roxan Hockey on 01/29/2014.  CURRENT THERAPY: Observation.  INTERVAL HISTORY: Amy Dunn 77 y.o. female returns to the clinic today for annual follow-up visit.  The patient is feeling fine today with no specific complaints except for the persistent left-sided chest pain from the surgical scar.  She denied having any shortness of breath, cough or hemoptysis.  She denied having any nausea, vomiting, diarrhea or constipation.  She has no weight loss or night sweats.  She is here today for evaluation with repeat CT scan of the chest for restaging of her disease.   MEDICAL HISTORY: Past Medical History:  Diagnosis Date  . Acute peptic ulcer, unspecified site, with hemorrhage and perforation, without mention of obstruction   . Cancer of left lung ([place]) 2015  . Cough   . GERD (gastroesophageal reflux disease)   . Glaucoma    right eye  . History of colonic polyps   . Hypertension   . Osteoarthritis of left shoulder   . Primary osteoarthritis of left hip   . Pulmonary nodule, left    Left Lower lobe  . Stroke [facility])    no residual  . TIA (transient ischemic attack) 2011   transient blindness, speech- resolved    ALLERGIES:  is allergic to penicillins.  MEDICATIONS:  Current Outpatient Medications  Medication Sig Dispense Refill  . amLODipine (NORVASC) 5 MG tablet Take 1 tablet (5 mg total) by mouth daily. 90 tablet 3  . azithromycin (ZITHROMAX) 250 MG tablet 2 tablets today, then 1 tablet daily until complete 6 tablet 0  .  dorzolamide-timolol (COSOPT) 22.3-6.8 MG/ML ophthalmic solution Place 1 drop into the left eye daily.     . fexofenadine (ALLEGRA) 180 MG tablet Take 180 mg by mouth daily as needed. For allergies    . fluticasone (FLONASE) 50 MCG/ACT nasal spray Place 2 sprays into both nostrils daily as needed for allergies or rhinitis.    Marland Kitchen ibuprofen (ADVIL,MOTRIN) 600 MG tablet Take 1 tablet (600 mg total) by mouth every 8 (eight) hours as needed. 60 tablet 3  . losartan-hydrochlorothiazide (HYZAAR) 100-25 MG tablet Take 1 tablet by mouth daily. 90 tablet 3  . metoprolol succinate (TOPROL-XL) 50 MG 24 hr tablet TAKE 1 TABLET BY MOUTH DAILY WITH OR IMMEDIATELY FOLLOWING A MEAL 90 tablet 3  . omeprazole (PRILOSEC) 20 MG capsule Take 20 mg by mouth daily.    . promethazine-codeine (PHENERGAN WITH CODEINE) 6.25-10 MG/5ML syrup Take 5 mLs by mouth every 6 (six) hours as needed for cough. 180 mL 0  . traMADol (ULTRAM) 50 MG tablet TAKE 1 TABLET BY MOUTH EVERY 6 HOURS AS NEEDED FOR MODERATE PAIN 60 tablet 1   No current facility-administered medications for this visit.     SURGICAL HISTORY:  Past Surgical History:  Procedure Laterality Date  . COLON SURGERY    . EYE SURGERY Bilateral    cataracts  . surgical repair of peptic ulcer    . TUBAL LIGATION    . VIDEO ASSISTED THORACOSCOPY (VATS)/WEDGE RESECTION Left 01/29/2014   Procedure: VIDEO ASSISTED THORACOSCOPY (VATS)/BASILAR  SEGMENTECTOMY  LEFT LUNG LOWER LOBE WITH FROZEN SECTION, LEFT LOWER LOBECTOMY;  Surgeon: Melrose Nakayama, MD;  Location: [facility];  Service: Thoracic;  Laterality: Left;    REVIEW OF SYSTEMS:  A comprehensive review of systems was negative except for: Respiratory: positive for pleurisy/chest pain   PHYSICAL EXAMINATION: General appearance: alert, cooperative and no distress Head: Normocephalic, without obvious abnormality, atraumatic Neck: no adenopathy, no JVD, supple, symmetrical, trachea midline and thyroid not enlarged, symmetric,  no tenderness/mass/nodules Lymph nodes: Cervical, supraclavicular, and axillary nodes normal. Resp: clear to auscultation bilaterally Back: symmetric, no curvature. ROM normal. No CVA tenderness. Cardio: regular rate and rhythm, S1, S2 normal, no murmur, click, rub or gallop GI: soft, non-tender; bowel sounds normal; no masses,  no organomegaly Extremities: extremities normal, atraumatic, no cyanosis or edema  ECOG PERFORMANCE STATUS: 1 - Symptomatic but completely ambulatory  Blood pressure 127/70, pulse 75, temperature 98.2 F (36.8 C), temperature source Oral, resp. rate 18, height 5\' 5"  (1.651 m), weight 131 lb 4.8 oz (59.6 kg), SpO2 100 %.  LABORATORY DATA: Lab Results  Component Value Date   WBC 7.0 04/08/2018   HGB 11.0 (L) 04/08/2018   HCT 31.2 (L) 04/08/2018   MCV 78.4 (L) 04/08/2018   PLT 205 04/08/2018      Chemistry      Component Value Date/Time   NA 140 04/08/2018 1242   NA 139 04/09/2017 0927   K 4.0 04/08/2018 1242   K 3.7 04/09/2017 0927   CL 102 04/08/2018 1242   CO2 28 04/08/2018 1242   CO2 29 04/09/2017 0927   BUN 22 04/08/2018 1242   BUN 18.7 04/09/2017 0927   CREATININE 1.56 (H) 04/08/2018 1242   CREATININE 1.3 (H) 04/09/2017 0927      Component Value Date/Time   CALCIUM 9.5 04/08/2018 1242   CALCIUM 9.4 04/09/2017 0927   ALKPHOS 94 04/08/2018 1242   ALKPHOS 95 04/09/2017 0927   AST 15 04/08/2018 1242   AST 16 04/09/2017 0927   ALT 8 04/08/2018 1242   ALT 12 04/09/2017 0927   BILITOT 0.5 04/08/2018 1242   BILITOT 0.58 04/09/2017 0927       RADIOGRAPHIC STUDIES: Ct Chest Wo Contrast  Result Date: 04/09/2018 CLINICAL DATA:  77 year old female with history of lung cancer diagnosed in 2015 status post surgical resection. Followup study. EXAM: CT CHEST WITHOUT CONTRAST TECHNIQUE: Multidetector CT imaging of the chest was performed following the standard protocol without IV contrast. COMPARISON:  Chest CT 04/09/2017. FINDINGS: Cardiovascular:  Heart size is normal. There is no significant pericardial fluid, thickening or pericardial calcification. Aortic atherosclerosis. No coronary artery calcifications. Mediastinum/Nodes: No pathologically enlarged mediastinal or hilar lymph nodes. Please note that accurate exclusion of hilar adenopathy is limited on noncontrast CT scans. Esophagus is unremarkable in appearance. No axillary lymphadenopathy. Lungs/Pleura: Status post wedge resection in the left lower lobe with mild postoperative scarring. No suspicious appearing pulmonary nodules or masses are noted. No acute consolidative airspace disease. No pleural effusions. Upper Abdomen: Low-attenuation lesions in the liver and kidneys bilaterally, incompletely characterized on today's noncontrast CT examination, previously characterized as simple cysts. Musculoskeletal: There are no aggressive appearing lytic or blastic lesions noted in the visualized portions of the skeleton. IMPRESSION: 1. Status post left lower lobe wedge resection with no findings to suggest locally recurrent disease or definite metastatic disease in the thorax. 2. Aortic atherosclerosis. Aortic Atherosclerosis (ICD10-I70.0). Electronically Signed   By: Vinnie Langton M.D.   On: 04/09/2018 20:47  ASSESSMENT AND PLAN: This is a very pleasant 77 years old African American female recently diagnosed with a stage IA non-small cell lung cancer status post left lower lobe wedge resection and currently on observation with no complaint except for persistent left-sided chest pain at the surgical scar.  The patient had repeat CT scan of the chest performed recently.  I personally and independently reviewed the scans and discussed the results with the patient today. Her scan showed no concerning findings for disease recurrence or metastasis in the chest. I recommended for the patient to continue on observation with repeat CT scan of the chest in 1 year. She was advised to call immediately if  she has any concerning symptoms in the interval. The patient voices understanding of current disease status and treatment options and is in agreement with the current care plan.  All questions were answered. The patient knows to call the clinic with any problems, questions or concerns. We can certainly see the patient much sooner if necessary.  Disclaimer: This note was dictated with voice recognition software. Similar sounding words can inadvertently be transcribed and may not be corrected upon review.

## 2018-04-22 ENCOUNTER — Ambulatory Visit: Payer: Self-pay | Admitting: *Deleted

## 2018-04-22 NOTE — Telephone Encounter (Signed)
Fine to see Monday

## 2018-04-22 NOTE — Telephone Encounter (Signed)
Pt reports constant "Dull pain at right breastbone area, more towards right shoulder." Onset 4 days ago. States pain is worse with movement of arm, shoulder; 6/10 with movement. Does not recall any injury. Reports told by oncologist last week she had arthritis in left shoulder; h/o lung CA. "Feels like arthritis."  Pt denies any nausea, diaphoresis, dizziness, SOB.  No increased fatigue or weakness. States pain is relieved with heat and tramadol, "But returns."  Attempted to secure appt with Amy Dunn for today; pt declined. Also declines disposition of UC to R/O cardiac issue.  Wishes to see Amy Dunn Monday. Appt made for Monday with Amy Dunn. Care advise given per protocol. Pt instructed to go to ED if symptoms worsen, any nausea, diaphoresis, dizziness occur.  Reason for Disposition . [1] Chest pain lasts > 5 minutes AND [2] occurred > 3 days ago (72 hours) AND [3] NO chest pain or cardiac symptoms now  Answer Assessment - Initial Assessment Questions 1. LOCATION: "Where does it hurt?"       Right chest at sternum, towards right shoulder 2. RADIATION: "Does the pain go anywhere else?" (e.g., into neck, jaw, arms, back)     Right shoulder 3. ONSET: "When did the chest pain begin?" (Minutes, hours or days)      4 days ago 4. PATTERN "Does the pain come and go, or has it been constant since it started?"  "Does it get worse with exertion?"     "Dull constant" Intermittent to right shoulder; worse with movement of arm, shoulder 5. DURATION: "How long does it last" (e.g., seconds, minutes, hours)      Constant dull 6. SEVERITY: "How bad is the pain?"  (e.g., Scale 1-10; mild, moderate, or severe)    - MILD (1-3): doesn't interfere with normal activities     - MODERATE (4-7): interferes with normal activities or awakens from sleep    - SEVERE (8-10): excruciating pain, unable to do any normal activities       6/10 "At times when moving arm." 7. CARDIAC RISK FACTORS: "Do you have any history  of heart problems or risk factors for heart disease?" (e.g., prior heart attack, angina; high blood pressure, diabetes, being overweight, high cholesterol, smoking, or strong family history of heart disease)     no 8. PULMONARY RISK FACTORS: "Do you have any history of lung disease?"  (e.g., blood clots in lung, asthma, emphysema, birth control pills)    Lung CA 9. CAUSE: "What do you think is causing the chest pain?"     Arthritis, "I was told I had arthritis in my left shoulder too."  Feels like arthritis." 10. OTHER SYMPTOMS: "Do you have any other symptoms?" (e.g., dizziness, nausea, vomiting, sweating, fever, difficulty breathing, cough)       no  Protocols used: CHEST PAIN-A-AH

## 2018-04-25 ENCOUNTER — Encounter: Payer: Self-pay | Admitting: Internal Medicine

## 2018-04-25 ENCOUNTER — Ambulatory Visit (INDEPENDENT_AMBULATORY_CARE_PROVIDER_SITE_OTHER): Payer: Medicare HMO | Admitting: Internal Medicine

## 2018-04-25 DIAGNOSIS — M25512 Pain in left shoulder: Secondary | ICD-10-CM | POA: Insufficient documentation

## 2018-04-25 DIAGNOSIS — M25511 Pain in right shoulder: Secondary | ICD-10-CM

## 2018-04-25 MED ORDER — TRAMADOL HCL 50 MG PO TABS: ORAL_TABLET | ORAL | 1 refills | Status: DC

## 2018-04-25 MED ORDER — METHYLPREDNISOLONE ACETATE 40 MG/ML IJ SUSP
40.0000 mg | Freq: Once | INTRAMUSCULAR | Status: AC
  Administered 2018-04-25: 40 mg via INTRAMUSCULAR

## 2018-04-25 NOTE — Assessment & Plan Note (Signed)
Given depo-medrol 40 mg IM and refilled tramadol. She is encouraged to use tylenol and heating pad for pain as well. If no improvement in 1 week she will call back and we will do PT.

## 2018-04-25 NOTE — Progress Notes (Signed)
   Subjective:    Patient ID: Amy Dunn, female    DOB: July 02, 1940, 77 y.o.   MRN: [id number]  HPI The patient is a 77 YO female coming in for right shoulder pain. She cannot recall injury or overuse prior to onset. Started about 1 week ago. Denies fevers or chills. Just had CT chest for lung cancer follow up without evidence for recurrence beginning of December. She is having pain with ROM of the shoulder. No numbness or weakness down the right arm. She is also having some pain in the upper back on the right side. Using tramadol which helps but does not last long. Has used NSAIDs as well and these have made her stomach hurt so she stopped them. She has had arthritis in the left shoulder before and it feels similar. Denies SOB different than usual. Denies chest pains.   Review of Systems  Constitutional: Positive for activity change. Negative for appetite change, chills, fatigue, fever and unexpected weight change.  Respiratory: Negative.   Cardiovascular: Negative.   Gastrointestinal: Negative.   Musculoskeletal: Positive for arthralgias, back pain and myalgias. Negative for gait problem and joint swelling.  Skin: Negative.   Neurological: Negative.       Objective:   Physical Exam  Constitutional: She is oriented to person, place, and time. She appears well-developed and well-nourished.  HENT:  Head: Normocephalic and atraumatic.  Eyes: EOM are normal.  Neck: Normal range of motion.  Cardiovascular: Normal rate and regular rhythm.  Pulmonary/Chest: Effort normal and breath sounds normal. No respiratory distress. She has no wheezes. She has no rales.  Musculoskeletal: She exhibits tenderness. She exhibits no edema.  Right AC joint tenderness and right scapular pain. No pain in the biceps or triceps right. ROM limited by pain but passive with close to intact ROM. No pain in the chest region. No neck pain. No numbness.   Neurological: She is alert and oriented to person, place, and time.  Coordination normal.  Skin: Skin is warm and dry.   Vitals:   04/25/18 0852  BP: 104/70  Pulse: 83  Temp: 98.1 F (36.7 C)  TempSrc: Oral  SpO2: 98%  Weight: 130 lb (59 kg)  Height: 5\' 5"  (1.651 m)      Assessment & Plan:

## 2018-04-25 NOTE — Patient Instructions (Signed)
We have given you a shot today of cortisone.   We have sent in a refill of the tramadol.   Call us back if not improving in 1 week or so. You can use tylenol as well and a heating pad.

## 2018-05-17 DIAGNOSIS — H26493 Other secondary cataract, bilateral: Secondary | ICD-10-CM | POA: Diagnosis not present

## 2018-05-17 DIAGNOSIS — H348312 Tributary (branch) retinal vein occlusion, right eye, stable: Secondary | ICD-10-CM | POA: Diagnosis not present

## 2018-05-17 DIAGNOSIS — H31091 Other chorioretinal scars, right eye: Secondary | ICD-10-CM | POA: Diagnosis not present

## 2018-05-17 DIAGNOSIS — H401131 Primary open-angle glaucoma, bilateral, mild stage: Secondary | ICD-10-CM | POA: Diagnosis not present

## 2018-05-17 DIAGNOSIS — Z961 Presence of intraocular lens: Secondary | ICD-10-CM | POA: Diagnosis not present

## 2018-05-31 ENCOUNTER — Other Ambulatory Visit: Payer: Self-pay | Admitting: Internal Medicine

## 2018-06-08 ENCOUNTER — Other Ambulatory Visit: Payer: Self-pay | Admitting: Internal Medicine

## 2018-06-13 ENCOUNTER — Telehealth: Payer: Self-pay | Admitting: Internal Medicine

## 2018-06-13 MED ORDER — LOSARTAN POTASSIUM 100 MG PO TABS
100.0000 mg | ORAL_TABLET | Freq: Every day | ORAL | 0 refills | Status: DC
Start: 2018-06-13 — End: 2018-09-05

## 2018-06-13 MED ORDER — HYDROCHLOROTHIAZIDE 25 MG PO TABS: 25.0000 mg | ORAL_TABLET | Freq: Every day | ORAL | 0 refills | Status: DC

## 2018-06-13 NOTE — Telephone Encounter (Signed)
Patient informed that medication was sent in and that it will be two pills daily one losartan one HCTZ. Patient stated understanding

## 2018-06-13 NOTE — Telephone Encounter (Signed)
Copied from [place] [id number]. Topic: General - Other >> Jun 13, 2018  9:54 AM Lennox Solders wrote: Reason for CRM: pt is calling [facility] pharm is out of losartan  and they do not know if medication will come in . Pt needs another BP med. [facility] [address]

## 2018-06-13 NOTE — Telephone Encounter (Signed)
Sent in

## 2018-06-13 NOTE — Telephone Encounter (Signed)
Called pharmacy and they are on back order of the combination losartan-HCTZ 100-25 but they can split the medication.

## 2018-06-15 ENCOUNTER — Encounter (INDEPENDENT_AMBULATORY_CARE_PROVIDER_SITE_OTHER): Payer: Medicare HMO | Admitting: Ophthalmology

## 2018-06-15 DIAGNOSIS — H35033 Hypertensive retinopathy, bilateral: Secondary | ICD-10-CM

## 2018-06-15 DIAGNOSIS — H43813 Vitreous degeneration, bilateral: Secondary | ICD-10-CM | POA: Diagnosis not present

## 2018-06-15 DIAGNOSIS — H348312 Tributary (branch) retinal vein occlusion, right eye, stable: Secondary | ICD-10-CM

## 2018-06-15 DIAGNOSIS — I10 Essential (primary) hypertension: Secondary | ICD-10-CM

## 2018-07-01 ENCOUNTER — Ambulatory Visit: Payer: Medicare HMO | Admitting: Adult Health

## 2018-07-01 ENCOUNTER — Other Ambulatory Visit: Payer: Self-pay | Admitting: Internal Medicine

## 2018-07-21 DIAGNOSIS — H26491 Other secondary cataract, right eye: Secondary | ICD-10-CM | POA: Diagnosis not present

## 2018-08-02 ENCOUNTER — Other Ambulatory Visit: Payer: Self-pay | Admitting: Internal Medicine

## 2018-08-25 ENCOUNTER — Other Ambulatory Visit: Payer: Self-pay | Admitting: Internal Medicine

## 2018-08-25 MED ORDER — TRAMADOL HCL 50 MG PO TABS: 50.0000 mg | ORAL_TABLET | Freq: Two times a day (BID) | ORAL | 0 refills | Status: DC | PRN

## 2018-09-04 ENCOUNTER — Other Ambulatory Visit: Payer: Self-pay | Admitting: Internal Medicine

## 2018-10-24 ENCOUNTER — Other Ambulatory Visit: Payer: Self-pay | Admitting: Internal Medicine

## 2018-10-25 NOTE — Telephone Encounter (Signed)
Control database checked last refill: 08/26/2018 LOV: 04/25/2018 shoulder pain [id number]

## 2018-10-26 ENCOUNTER — Other Ambulatory Visit: Payer: Self-pay | Admitting: Internal Medicine

## 2018-10-27 NOTE — Telephone Encounter (Signed)
Control database checked last refill: 10/25/2018  LOV: 04/25/2018 NOV: none

## 2018-12-05 ENCOUNTER — Other Ambulatory Visit: Payer: Self-pay | Admitting: Internal Medicine

## 2018-12-26 ENCOUNTER — Telehealth: Payer: Self-pay

## 2018-12-26 ENCOUNTER — Encounter: Payer: Self-pay | Admitting: Internal Medicine

## 2018-12-26 ENCOUNTER — Ambulatory Visit (INDEPENDENT_AMBULATORY_CARE_PROVIDER_SITE_OTHER): Payer: Medicare HMO | Admitting: Internal Medicine

## 2018-12-26 DIAGNOSIS — R05 Cough: Secondary | ICD-10-CM

## 2018-12-26 DIAGNOSIS — Z20828 Contact with and (suspected) exposure to other viral communicable diseases: Secondary | ICD-10-CM | POA: Diagnosis not present

## 2018-12-26 DIAGNOSIS — R51 Headache: Secondary | ICD-10-CM | POA: Diagnosis not present

## 2018-12-26 DIAGNOSIS — R059 Cough, unspecified: Secondary | ICD-10-CM

## 2018-12-26 DIAGNOSIS — Z20822 Contact with and (suspected) exposure to covid-19: Secondary | ICD-10-CM

## 2018-12-26 MED ORDER — DOXYCYCLINE HYCLATE 100 MG PO TABS: 100.0000 mg | ORAL_TABLET | Freq: Two times a day (BID) | ORAL | 0 refills | Status: DC

## 2018-12-26 MED ORDER — PROMETHAZINE-DM 6.25-15 MG/5ML PO SYRP: 5.0000 mL | ORAL_SOLUTION | Freq: Two times a day (BID) | ORAL | 0 refills | Status: DC | PRN

## 2018-12-26 NOTE — Telephone Encounter (Signed)
Phone call has been made for 7/27 @320pm .

## 2018-12-26 NOTE — Progress Notes (Signed)
Virtual Visit via Audio Note  I connected with Amy Dunn on 12/26/18 at  3:20 PM EDT by an audio-only enabled telemedicine application and verified that I am speaking with the correct person using two identifiers.  The patient and the provider were at separate locations throughout the entire encounter.   I discussed the limitations of evaluation and management by telemedicine and the availability of in person appointments. The patient expressed understanding and agreed to proceed.  History of Present Illness: The patient is a 79 y.o. female with visit for sinus problems. Is coughing and head hurting. Is coughing up some mucus. Started about 2-3 days ago. Has been taking allegra and otc medications but that is not helping. She denies fevers or chills. Some diarrhea off and on since Sunday. Has prior VATs in 2015. Denies SOB. Overall it is worsening. Has tried otc cough medicines without relief and otc allergy medicine.  Observations/Objective: Voice strong, some coughing during phone call, no dyspnea while talking  Assessment and Plan: See problem oriented charting  Follow Up Instructions: rx doxycycline and promethazine/dm and get covid-19 testing done tomorrow  Visit time 11 minutes: that time was spent in face to face counseling and coordination of care with the patient: counseled about as above  I discussed the assessment and treatment plan with the patient. The patient was provided an opportunity to ask questions and all were answered. The patient agreed with the plan and demonstrated an understanding of the instructions.   The patient was advised to call back or seek an in-person evaluation if the symptoms worsen or if the condition fails to improve as anticipated.  [name], MD

## 2018-12-26 NOTE — Telephone Encounter (Signed)
Patient cannot come into office so will have to do a phone visit.

## 2018-12-26 NOTE — Telephone Encounter (Signed)
Copied from [place] [phone]. Topic: Appointment Scheduling - Scheduling Inquiry for Clinic >> Dec 26, 2018 10:32 AM Scherrie Gerlach wrote: Reason for CRM: pt calling to make appt for her sinus issue, drainage  She states she has phlegm when she wakes up in the am. Would like appt with Dr Sharlet Salina. Unable to reach the office after several attempts >> Dec 26, 2018  2:26 PM Lonia Skinner [place] A wrote: Patient is unable to do a DOXY visit. Insurance does not cover phone visit.   Please advise how to go forward.

## 2018-12-26 NOTE — Telephone Encounter (Signed)
Noted thanks °

## 2018-12-27 ENCOUNTER — Other Ambulatory Visit: Payer: Self-pay | Admitting: Internal Medicine

## 2018-12-27 DIAGNOSIS — Z20822 Contact with and (suspected) exposure to covid-19: Secondary | ICD-10-CM | POA: Insufficient documentation

## 2018-12-27 DIAGNOSIS — R6889 Other general symptoms and signs: Secondary | ICD-10-CM | POA: Diagnosis not present

## 2018-12-27 NOTE — Assessment & Plan Note (Signed)
Getting covid-19 testing done tomorrow as test site closed now. Rx for cough medicine as well as doxycycline to cover for infection. Advised if breathing problems seek care in ER.

## 2018-12-29 LAB — NOVEL CORONAVIRUS, NAA: SARS-CoV-2, NAA: NOT DETECTED

## 2019-01-18 ENCOUNTER — Other Ambulatory Visit: Payer: Self-pay | Admitting: Internal Medicine

## 2019-01-19 NOTE — Telephone Encounter (Signed)
Please advise per Dr. Nathanial Millman absence. Thank you   Control database checked last refill:10/25/2018 60 tabs LOV: 04/25/2018 NOV: none

## 2019-02-01 NOTE — Progress Notes (Signed)
Subjective:   Amy Dunn is a 78 y.o. female who presents for Medicare Annual (Subsequent) preventive examination. Review of Systems:   Cardiac Risk Factors include: advanced age (>36men, >70 women);dyslipidemia;hypertension Sleep patterns: feels rested on waking, gets up 1 times nightly to void and sleeps 7-8 hours nightly.   Home Safety/Smoke Alarms: Feels safe in home. Smoke alarms in place.  Living environment; residence and Firearm Safety: 1-story house/ trailer. Lives with husband, no needs for DME, good support system Seat Belt Safety/Bike Helmet: Wears seat belt.     Objective:     Vitals: There were no vitals taken for this visit.  There is no height or weight on file to calculate BMI.  Advanced Directives 02/02/2019 04/15/2016 04/09/2015 10/01/2014 03/29/2014 01/29/2014 01/25/2014  Does Patient Have a Medical Advance Directive? No No No No No No No  Would patient like information on creating a medical advance directive? Yes (ED - Information included in AVS) No - patient declined information No - patient declined information No - patient declined information No - patient declined information No - patient declined information No - patient declined information    Tobacco Social History   Tobacco Use  Smoking Status Former Smoker  . Years: 1.00  . Types: Cigarettes  . Quit date: 05/26/1981  . Years since quitting: 37.7  Smokeless Tobacco Never Used  Tobacco Comment   2 ciggs per day     Counseling given: Not Answered Comment: 2 ciggs per day  Past Medical History:  Diagnosis Date  . Acute peptic ulcer, unspecified site, with hemorrhage and perforation, without mention of obstruction   . Cancer of left lung ([place]) 2015  . Cough   . GERD (gastroesophageal reflux disease)   . Glaucoma    right eye  . History of colonic polyps   . Hypertension   . Osteoarthritis of left shoulder   . Primary osteoarthritis of left hip   . Pulmonary nodule, left    Left Lower lobe   . Stroke [facility])    no residual  . TIA (transient ischemic attack) 2011   transient blindness, speech- resolved   Past Surgical History:  Procedure Laterality Date  . COLON SURGERY    . EYE SURGERY Bilateral    cataracts  . surgical repair of peptic ulcer    . TUBAL LIGATION    . VIDEO ASSISTED THORACOSCOPY (VATS)/WEDGE RESECTION Left 01/29/2014   Procedure: VIDEO ASSISTED THORACOSCOPY (VATS)/BASILAR SEGMENTECTOMY  LEFT LUNG LOWER LOBE WITH FROZEN SECTION, LEFT LOWER LOBECTOMY;  Surgeon: Melrose Nakayama, MD;  Location: [facility];  Service: Thoracic;  Laterality: Left;   Family History  Problem Relation Age of Onset  . Hypertension Mother   . Heart disease Mother   . Heart attack Mother   . Cancer Father        Colon cancer   Social History   Socioeconomic History  . Marital status: Married    Spouse name: Not on file  . Number of children: 2  . Years of education: Not on file  . Highest education level: Not on file  Occupational History  . Occupation: retired  Scientific laboratory technician  . Financial resource strain: Not hard at all  . Food insecurity    Worry: Never true    Inability: Never true  . Transportation needs    Medical: No    Non-medical: No  Tobacco Use  . Smoking status: Former Smoker    Years: 1.00    Types: Cigarettes  Quit date: 05/26/1981    Years since quitting: 37.7  . Smokeless tobacco: Never Used  . Tobacco comment: 2 ciggs per day  Substance and Sexual Activity  . Alcohol use: No  . Drug use: No  . Sexual activity: Not Currently  Lifestyle  . Physical activity    Days per week: 5 days    Minutes per session: 30 min  . Stress: Not at all  Relationships  . Social connections    Talks on phone: More than three times a week    Gets together: More than three times a week    Attends religious service: 1 to 4 times per year    Active member of club or organization: Yes    Attends meetings of clubs or organizations: 1 to 4 times per year     Relationship status: Married  Other Topics Concern  . Not on file  Social History Narrative   HSG   Married- '62   2 daughters- '65, '70; 3 grandchildren   Occupation retired- Lawyer of life: No CPR, no intubation, no heroic measures (pt provided end of life packet)    Outpatient Encounter Medications as of 02/02/2019  Medication Sig  . amLODipine (NORVASC) 5 MG tablet Take 1 tablet (5 mg total) by mouth daily. NEED OFFICE VISIT FOR REFILLS  . Cyanocobalamin (VITAMIN B12 PO) Take 1 tablet by mouth daily.  . dorzolamide-timolol (COSOPT) 22.3-6.8 MG/ML ophthalmic solution Place 1 drop into the left eye daily.   . fexofenadine (ALLEGRA) 180 MG tablet Take 180 mg by mouth daily as needed. For allergies  . fluticasone (FLONASE) 50 MCG/ACT nasal spray Place 2 sprays into both nostrils daily as needed for allergies or rhinitis.  . hydrochlorothiazide (HYDRODIURIL) 25 MG tablet Take 1 tablet (25 mg total) by mouth daily. Needs appointment for further refills  . ibuprofen (ADVIL,MOTRIN) 600 MG tablet Take 1 tablet (600 mg total) by mouth every 8 (eight) hours as needed.  Marland Kitchen losartan (COZAAR) 100 MG tablet Take 1 tablet (100 mg total) by mouth daily. Needs appointment for further refills  . metoprolol succinate (TOPROL-XL) 50 MG 24 hr tablet TAKE 1 TABLET BY MOUTH DAILY WITH OR IMMEDIATELY FOLLOWING A MEAL  . omeprazole (PRILOSEC) 20 MG capsule Take 20 mg by mouth daily.  . promethazine-dextromethorphan (PROMETHAZINE-DM) 6.25-15 MG/5ML syrup Take 5 mLs by mouth 2 (two) times daily as needed for cough.  . traMADol (ULTRAM) 50 MG tablet TAKE 1 TABLET BY MOUTH EVERY 12 HOURS AS NEEDED  . [DISCONTINUED] doxycycline (VIBRA-TABS) 100 MG tablet Take 1 tablet (100 mg total) by mouth 2 (two) times daily. (Patient not taking: Reported on 02/02/2019)  . [DISCONTINUED] losartan-hydrochlorothiazide (HYZAAR) 100-25 MG tablet Take 1 tablet by mouth daily. Need annual appointment for further refills (Patient  not taking: Reported on 02/02/2019)   No facility-administered encounter medications on file as of 02/02/2019.     Activities of Daily Living In your present state of health, do you have any difficulty performing the following activities: 02/02/2019  Hearing? N  Vision? N  Difficulty concentrating or making decisions? N  Walking or climbing stairs? N  Dressing or bathing? N  Doing errands, shopping? N  Preparing Food and eating ? N  Using the Toilet? N  In the past six months, have you accidently leaked urine? N  Do you have problems with loss of bowel control? N  Managing your Medications? N  Managing your Finances? N  Housekeeping or managing your  Housekeeping? N  Some recent data might be hidden    Patient Care Team: [name], MD as PCP - General (Internal Medicine) Curt Bears, MD as Consulting Physician (Oncology) Clent Jacks, MD as Consulting Physician (Ophthalmology)    Assessment:   This is a routine wellness examination for [facility]. Physical assessment deferred to PCP.  Exercise Activities and Dietary recommendations Current Exercise Habits: Home exercise routine, Time (Minutes): 30, Frequency (Times/Week): 6, Weekly Exercise (Minutes/Week): 180, Intensity: Mild, Exercise limited by: orthopedic condition(s)  Diet (meal preparation, eat out, water intake, caffeinated beverages, dairy products, fruits and vegetables): in general, a "healthy" diet  , well balanced.   Reviewed heart healthy diet. Encouraged patient to increase daily water and healthy fluid intake. Discussed patient maintaining her weight. Patient stated she cannot tolerate drinking nutritional supplements but she will increase that amount of food she is eating to maintain her weight.   Goals    . Patient Stated     Continue to enjoy doing yard work and gardening. Stay as active as possible, eat healthy and continue to ride stationary bicycle.       Fall Risk Fall Risk  02/02/2019 05/24/2017  04/01/2016 02/01/2015  Falls in the past year? 0 No No No  Number falls in past yr: 0 - - -  Injury with Fall? 0 - - -    Depression Screen PHQ 2/9 Scores 02/02/2019 05/24/2017 04/01/2016 02/01/2015  PHQ - 2 Score 0 0 0 0     Cognitive Function     6CIT Screen 02/02/2019  What Year? 0 points  What month? 0 points  What time? 0 points  Count back from 20 2 points  Months in reverse 2 points  Repeat phrase 2 points  Total Score 6    Immunization History  Administered Date(s) Administered  . Fluad Quad(high Dose 65+) 02/02/2019  . Influenza Whole 04/16/2009, 02/17/2010, 03/02/2011  . Influenza, High Dose Seasonal PF 03/14/2013, 03/10/2016, 03/05/2017, 03/30/2018  . Influenza-Unspecified 03/19/2014, 01/23/2015  . Pneumococcal Conjugate-13 04/01/2015  . Pneumococcal Polysaccharide-23 02/17/2010  . Td 03/20/2010    Screening Tests Health Maintenance  Topic Date Due  . TETANUS/TDAP  03/20/2020  . INFLUENZA VACCINE  Completed  . PNA vac Low Risk Adult  Completed  . DEXA SCAN  Addressed      Plan:     Made an appointment with PCP on 02/03/19 due to patient complaining of fluid in her ears and an ear ache on the right side, she feels this could be residual from prior sinus infection.   Reviewed health maintenance screenings with patient today and relevant education, vaccines, and/or referrals were provided.   I have personally reviewed and noted the following in the patient's chart:   . Medical and social history . Use of alcohol, tobacco or illicit drugs  . Current medications and supplements . Functional ability and status . Nutritional status . Physical activity . Advanced directives . List of other physicians . Vitals . Screenings to include cognitive, depression, and falls . Referrals and appointments  In addition, I have reviewed and discussed with patient certain preventive protocols, quality metrics, and best practice recommendations. A written personalized care plan  for preventive services as well as general preventive health recommendations were provided to patient.     Michiel Cowboy, RN  02/02/2019

## 2019-02-02 ENCOUNTER — Ambulatory Visit (INDEPENDENT_AMBULATORY_CARE_PROVIDER_SITE_OTHER): Payer: Medicare HMO | Admitting: *Deleted

## 2019-02-02 ENCOUNTER — Other Ambulatory Visit: Payer: Self-pay

## 2019-02-02 VITALS — BP 130/72 | HR 77 | Resp 17 | Ht 65.0 in | Wt 127.0 lb

## 2019-02-02 DIAGNOSIS — Z0000 Encounter for general adult medical examination without abnormal findings: Secondary | ICD-10-CM

## 2019-02-02 DIAGNOSIS — Z23 Encounter for immunization: Secondary | ICD-10-CM

## 2019-02-02 NOTE — Patient Instructions (Signed)
.Continue doing brain stimulating activities (puzzles, reading, adult coloring books, staying active) to keep memory sharp.   Continue to eat heart healthy diet (full of fruits, vegetables, whole grains, lean protein, water--limit salt, fat, and sugar intake) and increase physical activity as tolerated.   Amy Dunn , Thank you for taking time to come for your Medicare Wellness Visit. I appreciate your ongoing commitment to your health goals. Please review the following plan we discussed and let me know if I can assist you in the future.   These are the goals we discussed: Goals    . Patient Stated     Continue to enjoy doing yard work and gardening. Stay as active as possible, eat healthy and continue to ride stationary bicycle.       This is a list of the screening recommended for you and due dates:  Health Maintenance  Topic Date Due  . Flu Shot  12/31/2018  . Tetanus Vaccine  03/20/2020  . Pneumonia vaccines  Completed  . DEXA scan (bone density measurement)  Addressed    Preventive Care 73 Years and Older, Female Preventive care refers to lifestyle choices and visits with your health care provider that can promote health and wellness. This includes:  A yearly physical exam. This is also called an annual well check.  Regular dental and eye exams.  Immunizations.  Screening for certain conditions.  Healthy lifestyle choices, such as diet and exercise. What can I expect for my preventive care visit? Physical exam Your health care provider will check:  Height and weight. These may be used to calculate body mass index (BMI), which is a measurement that tells if you are at a healthy weight.  Heart rate and blood pressure.  Your skin for abnormal spots. Counseling Your health care provider may ask you questions about:  Alcohol, tobacco, and drug use.  Emotional well-being.  Home and relationship well-being.  Sexual activity.  Eating habits.  History of falls.   Memory and ability to understand (cognition).  Work and work Statistician.  Pregnancy and menstrual history. What immunizations do I need?  Influenza (flu) vaccine  This is recommended every year. Tetanus, diphtheria, and pertussis (Tdap) vaccine  You may need a Td booster every 10 years. Varicella (chickenpox) vaccine  You may need this vaccine if you have not already been vaccinated. Zoster (shingles) vaccine  You may need this after age 52. Pneumococcal conjugate (PCV13) vaccine  One dose is recommended after age 32. Pneumococcal polysaccharide (PPSV23) vaccine  One dose is recommended after age 42. Measles, mumps, and rubella (MMR) vaccine  You may need at least one dose of MMR if you were born in 1957 or later. You may also need a second dose. Meningococcal conjugate (MenACWY) vaccine  You may need this if you have certain conditions. Hepatitis A vaccine  You may need this if you have certain conditions or if you travel or work in places where you may be exposed to hepatitis A. Hepatitis B vaccine  You may need this if you have certain conditions or if you travel or work in places where you may be exposed to hepatitis B. Haemophilus influenzae type b (Hib) vaccine  You may need this if you have certain conditions. You may receive vaccines as individual doses or as more than one vaccine together in one shot (combination vaccines). Talk with your health care provider about the risks and benefits of combination vaccines. What tests do I need? Blood tests  Lipid and  cholesterol levels. These may be checked every 5 years, or more frequently depending on your overall health.  Hepatitis C test.  Hepatitis B test. Screening  Lung cancer screening. You may have this screening every year starting at age 28 if you have a 30-pack-year history of smoking and currently smoke or have quit within the past 15 years.  Colorectal cancer screening. All adults should have this  screening starting at age 67 and continuing until age 35. Your health care provider may recommend screening at age 67 if you are at increased risk. You will have tests every 1-10 years, depending on your results and the type of screening test.  Diabetes screening. This is done by checking your blood sugar (glucose) after you have not eaten for a while (fasting). You may have this done every 1-3 years.  Mammogram. This may be done every 1-2 years. Talk with your health care provider about how often you should have regular mammograms.  BRCA-related cancer screening. This may be done if you have a family history of breast, ovarian, tubal, or peritoneal cancers. Other tests  Sexually transmitted disease (STD) testing.  Bone density scan. This is done to screen for osteoporosis. You may have this done starting at age 36. Follow these instructions at home: Eating and drinking  Eat a diet that includes fresh fruits and vegetables, whole grains, lean protein, and low-fat dairy products. Limit your intake of foods with high amounts of sugar, saturated fats, and salt.  Take vitamin and mineral supplements as recommended by your health care provider.  Do not drink alcohol if your health care provider tells you not to drink.  If you drink alcohol: ? Limit how much you have to 0-1 drink a day. ? Be aware of how much alcohol is in your drink. In the [place] one drink equals one 12 oz bottle of beer (355 mL), one 5 oz glass of wine (148 mL), or one 1 oz glass of hard liquor (44 mL). Lifestyle  Take daily care of your teeth and gums.  Stay active. Exercise for at least 30 minutes on 5 or more days each week.  Do not use any products that contain nicotine or tobacco, such as cigarettes, e-cigarettes, and chewing tobacco. If you need help quitting, ask your health care provider.  If you are sexually active, practice safe sex. Use a condom or other form of protection in order to prevent STIs (sexually  transmitted infections).  Talk with your health care provider about taking a low-dose aspirin or statin. What's next?  Go to your health care provider once a year for a well check visit.  Ask your health care provider how often you should have your eyes and teeth checked.  Stay up to date on all vaccines. This information is not intended to replace advice given to you by your health care provider. Make sure you discuss any questions you have with your health care provider. Document Released: 06/14/2015 Document Revised: 05/12/2018 Document Reviewed: 05/12/2018 Elsevier Patient Education  2020 [employer].

## 2019-02-02 NOTE — Progress Notes (Signed)
Medical screening examination/treatment/procedure(s) were performed by non-physician practitioner and as supervising physician I was immediately available for consultation/collaboration. I agree with above. Kamika Goodloe A Pink Maye, MD 

## 2019-02-03 ENCOUNTER — Encounter: Payer: Self-pay | Admitting: Internal Medicine

## 2019-02-03 ENCOUNTER — Ambulatory Visit (INDEPENDENT_AMBULATORY_CARE_PROVIDER_SITE_OTHER): Payer: Medicare HMO | Admitting: Internal Medicine

## 2019-02-03 ENCOUNTER — Other Ambulatory Visit: Payer: Self-pay

## 2019-02-03 DIAGNOSIS — H60391 Other infective otitis externa, right ear: Secondary | ICD-10-CM

## 2019-02-03 DIAGNOSIS — H6091 Unspecified otitis externa, right ear: Secondary | ICD-10-CM | POA: Insufficient documentation

## 2019-02-03 MED ORDER — NEOMYCIN-POLYMYXIN-HC 3.5-10000-1 OT SUSP: 3.0000 [drp] | Freq: Three times a day (TID) | OTIC | 0 refills | Status: DC

## 2019-02-03 NOTE — Assessment & Plan Note (Signed)
Rx cortisporin ear drops. 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 Pain may be partially related to dental issues and she is advised to get these taken care of when able.

## 2019-02-03 NOTE — Patient Instructions (Signed)
We have sent in the ear drops. Use 3 drops in the ear 3 times a day for 3 days.

## 2019-02-03 NOTE — Progress Notes (Signed)
   Subjective:   Patient ID: Amy Dunn, female    DOB: 07/03/1940, 78 y.o.   MRN: [id number]  HPI The patient is an 78 YO female coming in for right ear pain and fluid. Started within the last week or so. She used some old ear drops from 2013 or older. Denies pain in left ear. There is some hearing loss in the right ear. She denies sinus drainage. Denies cough or SOB. Denies fevers or chills. She does have some bad teeth and 1 tooth on the right side which is causing her mild pain at this time. She is afraid to get them taken care of due to pandemic. Has tinnitus bilateral which is stable.   Review of Systems  Constitutional: Negative.   HENT: Positive for dental problem, ear pain, hearing loss and tinnitus. Negative for congestion, drooling, ear discharge, facial swelling, mouth sores, nosebleeds, postnasal drip, sinus pressure, sinus pain, sneezing, sore throat, trouble swallowing and voice change.   Eyes: Negative.   Respiratory: Negative for cough, chest tightness and shortness of breath.   Cardiovascular: Negative for chest pain, palpitations and leg swelling.  Gastrointestinal: Negative for abdominal distention, abdominal pain, constipation, diarrhea, nausea and vomiting.  Musculoskeletal: Negative.   Skin: Negative.   Neurological: Negative.   Psychiatric/Behavioral: Negative.     Objective:  Physical Exam Constitutional:      Appearance: She is well-developed.  HENT:     Head: Normocephalic and atraumatic.     Ears:     Comments: Right ear with clear bulging fluid, pain with tugging right ear, left ear TM normal Neck:     Musculoskeletal: Normal range of motion.  Cardiovascular:     Rate and Rhythm: Normal rate and regular rhythm.  Pulmonary:     Effort: Pulmonary effort is normal. No respiratory distress.     Breath sounds: Normal breath sounds. No wheezing or rales.  Abdominal:     General: Bowel sounds are normal. There is no distension.     Palpations: Abdomen is  soft.     Tenderness: There is no abdominal tenderness. There is no rebound.  Skin:    General: Skin is warm and dry.  Neurological:     Mental Status: She is alert and oriented to person, place, and time.     Coordination: Coordination normal.     Vitals:   02/03/19 0939  BP: 110/80  Pulse: 86  Temp: 98.7 F (37.1 C)  TempSrc: Oral  SpO2: 96%  Weight: 128 lb (58.1 kg)  Height: 5\' 5"  (1.651 m)    Assessment & Plan:

## 2019-03-06 ENCOUNTER — Other Ambulatory Visit: Payer: Self-pay | Admitting: Internal Medicine

## 2019-03-08 ENCOUNTER — Ambulatory Visit (INDEPENDENT_AMBULATORY_CARE_PROVIDER_SITE_OTHER): Payer: Medicare HMO | Admitting: Internal Medicine

## 2019-03-08 ENCOUNTER — Other Ambulatory Visit: Payer: Self-pay

## 2019-03-08 ENCOUNTER — Other Ambulatory Visit (INDEPENDENT_AMBULATORY_CARE_PROVIDER_SITE_OTHER): Payer: Medicare HMO

## 2019-03-08 ENCOUNTER — Encounter: Payer: Self-pay | Admitting: Internal Medicine

## 2019-03-08 VITALS — BP 124/80 | HR 85 | Temp 97.9°F | Ht 65.0 in | Wt 127.0 lb

## 2019-03-08 DIAGNOSIS — M8949 Other hypertrophic osteoarthropathy, multiple sites: Secondary | ICD-10-CM

## 2019-03-08 DIAGNOSIS — M150 Primary generalized (osteo)arthritis: Secondary | ICD-10-CM

## 2019-03-08 DIAGNOSIS — Z0000 Encounter for general adult medical examination without abnormal findings: Secondary | ICD-10-CM

## 2019-03-08 DIAGNOSIS — C3492 Malignant neoplasm of unspecified part of left bronchus or lung: Secondary | ICD-10-CM | POA: Diagnosis not present

## 2019-03-08 DIAGNOSIS — I10 Essential (primary) hypertension: Secondary | ICD-10-CM

## 2019-03-08 DIAGNOSIS — M159 Polyosteoarthritis, unspecified: Secondary | ICD-10-CM

## 2019-03-08 LAB — COMPREHENSIVE METABOLIC PANEL
ALT: 9 U/L (ref 0–35)
AST: 16 U/L (ref 0–37)
Albumin: 4.3 g/dL (ref 3.5–5.2)
Alkaline Phosphatase: 83 U/L (ref 39–117)
BUN: 17 mg/dL (ref 6–23)
CO2: 32 mEq/L (ref 19–32)
Calcium: 10 mg/dL (ref 8.4–10.5)
Chloride: 99 mEq/L (ref 96–112)
Creatinine, Ser: 1.33 mg/dL — ABNORMAL HIGH (ref 0.40–1.20)
GFR: 46.4 mL/min — ABNORMAL LOW (ref >60.00)
Glucose, Bld: 101 mg/dL — ABNORMAL HIGH (ref 70–99)
Potassium: 3.6 mEq/L (ref 3.5–5.1)
Sodium: 140 mEq/L (ref 135–145)
Total Bilirubin: 0.5 mg/dL (ref 0.2–1.2)
Total Protein: 7.9 g/dL (ref 6.0–8.3)

## 2019-03-08 LAB — CBC
HCT: 34 % — ABNORMAL LOW (ref 36.0–46.0)
Hemoglobin: 11.8 g/dL — ABNORMAL LOW (ref 12.0–15.0)
MCHC: 34.7 g/dL (ref 30.0–36.0)
MCV: 80.4 fl (ref 78.0–100.0)
Platelets: 186 10*3/uL (ref 150.0–400.0)
RBC: 4.23 Mil/uL (ref 3.87–5.11)
RDW: 14.5 % (ref 11.5–15.5)
WBC: 8.3 10*3/uL (ref 4.0–10.5)

## 2019-03-08 LAB — LIPID PANEL
Cholesterol: 184 mg/dL (ref 0–200)
HDL: 47.5 mg/dL (ref >39.00)
LDL Cholesterol: 106 mg/dL — ABNORMAL HIGH (ref 0–99)
NonHDL: 136.09
Total CHOL/HDL Ratio: 4
Triglycerides: 149 mg/dL (ref 0.0–149.0)
VLDL: 29.8 mg/dL (ref 0.0–40.0)

## 2019-03-08 MED ORDER — HYDROCHLOROTHIAZIDE 25 MG PO TABS: 25.0000 mg | ORAL_TABLET | Freq: Every day | ORAL | 3 refills | Status: DC

## 2019-03-08 MED ORDER — AMLODIPINE BESYLATE 5 MG PO TABS: 5.0000 mg | ORAL_TABLET | Freq: Every day | ORAL | 3 refills | Status: DC

## 2019-03-08 MED ORDER — LOSARTAN POTASSIUM 100 MG PO TABS: 100.0000 mg | ORAL_TABLET | Freq: Every day | ORAL | 3 refills | Status: DC

## 2019-03-08 NOTE — Patient Instructions (Signed)
We are checking the labs today and want you to start taking metamucil daily to see if this helps the colon.    Health Maintenance, Female Adopting a healthy lifestyle and getting preventive care are important in promoting health and wellness. Ask your health care provider about:  The right schedule for you to have regular tests and exams.  Things you can do on your own to prevent diseases and keep yourself healthy. What should I know about diet, weight, and exercise? Eat a healthy diet   Eat a diet that includes plenty of vegetables, fruits, low-fat dairy products, and lean protein.  Do not eat a lot of foods that are high in solid fats, added sugars, or sodium. Maintain a healthy weight Body mass index (BMI) is used to identify weight problems. It estimates body fat based on height and weight. Your health care provider can help determine your BMI and help you achieve or maintain a healthy weight. Get regular exercise Get regular exercise. This is one of the most important things you can do for your health. Most adults should:  Exercise for at least 150 minutes each week. The exercise should increase your heart rate and make you sweat (moderate-intensity exercise).  Do strengthening exercises at least twice a week. This is in addition to the moderate-intensity exercise.  Spend less time sitting. Even light physical activity can be beneficial. Watch cholesterol and blood lipids Have your blood tested for lipids and cholesterol at 80 years of age, then have this test every 5 years. Have your cholesterol levels checked more often if:  Your lipid or cholesterol levels are high.  You are older than 80 years of age.  You are at high risk for heart disease. What should I know about cancer screening? Depending on your health history and family history, you may need to have cancer screening at various ages. This may include screening for:  Breast cancer.  Cervical cancer.  Colorectal  cancer.  Skin cancer.  Lung cancer. What should I know about heart disease, diabetes, and high blood pressure? Blood pressure and heart disease  High blood pressure causes heart disease and increases the risk of stroke. This is more likely to develop in people who have high blood pressure readings, are of African descent, or are overweight.  Have your blood pressure checked: ? Every 3-5 years if you are 80-80 years of age. ? Every year if you are 80 years old or older. Diabetes Have regular diabetes screenings. This checks your fasting blood sugar level. Have the screening done:  Once every three years after age 44 if you are at a normal weight and have a low risk for diabetes.  More often and at a younger age if you are overweight or have a high risk for diabetes. What should I know about preventing infection? Hepatitis B If you have a higher risk for hepatitis B, you should be screened for this virus. Talk with your health care provider to find out if you are at risk for hepatitis B infection. Hepatitis C Testing is recommended for:  Everyone born from [birth year] through 1967.  Anyone with known risk factors for hepatitis C. Sexually transmitted infections (STIs)  Get screened for STIs, including gonorrhea and chlamydia, if: ? You are sexually active and are younger than 80 years of age. ? You are older than 80 years of age and your health care provider tells you that you are at risk for this type of infection. ? Your sexual  activity has changed since you were last screened, and you are at increased risk for chlamydia or gonorrhea. Ask your health care provider if you are at risk.  Ask your health care provider about whether you are at high risk for HIV. Your health care provider may recommend a prescription medicine to help prevent HIV infection. If you choose to take medicine to prevent HIV, you should first get tested for HIV. You should then be tested every 3 months for as long as  you are taking the medicine. Pregnancy  If you are about to stop having your period (premenopausal) and you may become pregnant, seek counseling before you get pregnant.  Take 400 to 800 micrograms (mcg) of folic acid every day if you become pregnant.  Ask for birth control (contraception) if you want to prevent pregnancy. Osteoporosis and menopause Osteoporosis is a disease in which the bones lose minerals and strength with aging. This can result in bone fractures. If you are 50 years old or older, or if you are at risk for osteoporosis and fractures, ask your health care provider if you should:  Be screened for bone loss.  Take a calcium or vitamin D supplement to lower your risk of fractures.  Be given hormone replacement therapy (HRT) to treat symptoms of menopause. Follow these instructions at home: Lifestyle  Do not use any products that contain nicotine or tobacco, such as cigarettes, e-cigarettes, and chewing tobacco. If you need help quitting, ask your health care provider.  Do not use street drugs.  Do not share needles.  Ask your health care provider for help if you need support or information about quitting drugs. Alcohol use  Do not drink alcohol if: ? Your health care provider tells you not to drink. ? You are pregnant, may be pregnant, or are planning to become pregnant.  If you drink alcohol: ? Limit how much you use to 0-1 drink a day. ? Limit intake if you are breastfeeding.  Be aware of how much alcohol is in your drink. In the [place] one drink equals one 12 oz bottle of beer (355 mL), one 5 oz glass of wine (148 mL), or one 1 oz glass of hard liquor (44 mL). General instructions  Schedule regular health, dental, and eye exams.  Stay current with your vaccines.  Tell your health care provider if: ? You often feel depressed. ? You have ever been abused or do not feel safe at home. Summary  Adopting a healthy lifestyle and getting preventive care are  important in promoting health and wellness.  Follow your health care provider's instructions about healthy diet, exercising, and getting tested or screened for diseases.  Follow your health care provider's instructions on monitoring your cholesterol and blood pressure. This information is not intended to replace advice given to you by your health care provider. Make sure you discuss any questions you have with your health care provider. Document Released: 12/01/2010 Document Revised: 05/11/2018 Document Reviewed: 05/11/2018 Elsevier Patient Education  2020 [employer].

## 2019-03-08 NOTE — Progress Notes (Signed)
   Subjective:   Patient ID: Amy Dunn, female    DOB: [birth year], 78 y.o.   MRN: [id number]  HPI The patient is an 78 YO female coming in for physical.   PMH, [place], social history reviewed and updated  Review of Systems  Constitutional: Negative.   HENT: Negative.   Eyes: Negative.   Respiratory: Negative for cough, chest tightness and shortness of breath.   Cardiovascular: Negative for chest pain, palpitations and leg swelling.  Gastrointestinal: Negative for abdominal distention, abdominal pain, constipation, diarrhea, nausea and vomiting.  Musculoskeletal: Negative.   Skin: Negative.   Neurological: Negative.   Psychiatric/Behavioral: Negative.     Objective:  Physical Exam Constitutional:      Appearance: She is well-developed.  HENT:     Head: Normocephalic and atraumatic.  Neck:     Musculoskeletal: Normal range of motion.  Cardiovascular:     Rate and Rhythm: Normal rate and regular rhythm.  Pulmonary:     Effort: Pulmonary effort is normal. No respiratory distress.     Breath sounds: Normal breath sounds. No wheezing or rales.  Abdominal:     General: Bowel sounds are normal. There is no distension.     Palpations: Abdomen is soft.     Tenderness: There is no abdominal tenderness. There is no rebound.  Skin:    General: Skin is warm and dry.  Neurological:     Mental Status: She is alert and oriented to person, place, and time.     Coordination: Coordination normal.     Vitals:   03/08/19 0942  BP: 124/80  Pulse: 85  Temp: 97.9 F (36.6 C)  TempSrc: Oral  SpO2: 98%  Weight: 127 lb (57.6 kg)  Height: 5\' 5"  (1.651 m)    Assessment & Plan:

## 2019-03-10 NOTE — Assessment & Plan Note (Signed)
Flu shot up to date. Pneumonia complete. Shingrix counsled. Tetanus up to date. Colonoscopy aged out. Mammogram aged out, pap smear aged out and dexa declines further. Counseled about sun safety and mole surveillance. Counseled about the dangers of distracted driving. Given 10 year screening recommendations.

## 2019-03-10 NOTE — Assessment & Plan Note (Signed)
Stable, under surveillance. Mild SOB.

## 2019-03-10 NOTE — Assessment & Plan Note (Signed)
Tramadol daily prn. Counseled about benefit and risk. [place] database reviewed and no inappropriate. She wishes to continue.

## 2019-03-10 NOTE — Assessment & Plan Note (Signed)
BP at goal, checking CMP and adjust hctz and losartan and metoprolol as needed.

## 2019-03-30 ENCOUNTER — Other Ambulatory Visit: Payer: Self-pay | Admitting: Internal Medicine

## 2019-03-31 NOTE — Telephone Encounter (Signed)
[place] Controlled Database Checked Last filled: 01/19/19 # 60 LOV w/you: 03/08/19 Next appt w/you: None

## 2019-04-07 ENCOUNTER — Ambulatory Visit (HOSPITAL_COMMUNITY)
Admission: RE | Admit: 2019-04-07 | Discharge: 2019-04-07 | Disposition: A | Discharge status: Home / Self Care | Payer: Medicare HMO | Source: Ambulatory Visit | Attending: Internal Medicine | Admitting: Internal Medicine

## 2019-04-07 ENCOUNTER — Other Ambulatory Visit: Payer: Self-pay

## 2019-04-07 DIAGNOSIS — C3490 Malignant neoplasm of unspecified part of unspecified bronchus or lung: Secondary | ICD-10-CM

## 2019-04-10 ENCOUNTER — Other Ambulatory Visit: Payer: Self-pay

## 2019-04-10 ENCOUNTER — Inpatient Hospital Stay: Payer: Medicare HMO | Attending: Internal Medicine

## 2019-04-10 DIAGNOSIS — I10 Essential (primary) hypertension: Secondary | ICD-10-CM | POA: Diagnosis not present

## 2019-04-10 DIAGNOSIS — C3432 Malignant neoplasm of lower lobe, left bronchus or lung: Secondary | ICD-10-CM | POA: Insufficient documentation

## 2019-04-10 DIAGNOSIS — Z902 Acquired absence of lung [part of]: Secondary | ICD-10-CM | POA: Diagnosis not present

## 2019-04-10 DIAGNOSIS — Z8673 Personal history of transient ischemic attack (TIA), and cerebral infarction without residual deficits: Secondary | ICD-10-CM | POA: Insufficient documentation

## 2019-04-10 DIAGNOSIS — Z791 Long term (current) use of non-steroidal anti-inflammatories (NSAID): Secondary | ICD-10-CM | POA: Insufficient documentation

## 2019-04-10 DIAGNOSIS — Z79899 Other long term (current) drug therapy: Secondary | ICD-10-CM | POA: Diagnosis not present

## 2019-04-10 DIAGNOSIS — C3490 Malignant neoplasm of unspecified part of unspecified bronchus or lung: Secondary | ICD-10-CM

## 2019-04-10 LAB — CMP (CANCER CENTER ONLY)
ALT: 8 U/L (ref 0–44)
AST: 13 U/L — ABNORMAL LOW (ref 15–41)
Albumin: 4 g/dL (ref 3.5–5.0)
Alkaline Phosphatase: 87 U/L (ref 38–126)
Anion gap: 11 (ref 5–15)
BUN: 18 mg/dL (ref 8–23)
CO2: 29 mmol/L (ref 22–32)
Calcium: 9.1 mg/dL (ref 8.9–10.3)
Chloride: 100 mmol/L (ref 98–111)
Creatinine: 1.46 mg/dL — ABNORMAL HIGH (ref 0.44–1.00)
GFR, Est AFR Am: 39 mL/min — ABNORMAL LOW (ref >60)
GFR, Estimated: 34 mL/min — ABNORMAL LOW (ref >60)
Glucose, Bld: 94 mg/dL (ref 70–99)
Potassium: 3.7 mmol/L (ref 3.5–5.1)
Sodium: 140 mmol/L (ref 135–145)
Total Bilirubin: 0.4 mg/dL (ref 0.3–1.2)
Total Protein: 7.6 g/dL (ref 6.5–8.1)

## 2019-04-10 LAB — CBC WITH DIFFERENTIAL (CANCER CENTER ONLY)
Abs Immature Granulocytes: 0.02 10*3/uL (ref 0.00–0.07)
Basophils Absolute: 0.1 10*3/uL (ref 0.0–0.1)
Basophils Relative: 1 %
Eosinophils Absolute: 0.1 10*3/uL (ref 0.0–0.5)
Eosinophils Relative: 1 %
HCT: 31.6 % — ABNORMAL LOW (ref 36.0–46.0)
Hemoglobin: 11.2 g/dL — ABNORMAL LOW (ref 12.0–15.0)
Immature Granulocytes: 0 %
Lymphocytes Relative: 28 %
Lymphs Abs: 2 10*3/uL (ref 0.7–4.0)
MCH: 27.5 pg (ref 26.0–34.0)
MCHC: 35.4 g/dL (ref 30.0–36.0)
MCV: 77.5 fL — ABNORMAL LOW (ref 80.0–100.0)
Monocytes Absolute: 0.6 10*3/uL (ref 0.1–1.0)
Monocytes Relative: 9 %
Neutro Abs: 4.5 10*3/uL (ref 1.7–7.7)
Neutrophils Relative %: 61 %
Platelet Count: 199 10*3/uL (ref 150–400)
RBC: 4.08 MIL/uL (ref 3.87–5.11)
RDW: 13.7 % (ref 11.5–15.5)
WBC Count: 7.3 10*3/uL (ref 4.0–10.5)
nRBC: 0 % (ref 0.0–0.2)

## 2019-04-12 ENCOUNTER — Encounter: Payer: Self-pay | Admitting: Internal Medicine

## 2019-04-12 ENCOUNTER — Inpatient Hospital Stay: Payer: Medicare HMO | Admitting: Internal Medicine

## 2019-04-12 ENCOUNTER — Other Ambulatory Visit: Payer: Self-pay

## 2019-04-12 VITALS — BP 125/68 | HR 73 | Temp 98.2°F | Resp 18 | Ht 65.0 in | Wt 125.3 lb

## 2019-04-12 DIAGNOSIS — Z902 Acquired absence of lung [part of]: Secondary | ICD-10-CM | POA: Diagnosis not present

## 2019-04-12 DIAGNOSIS — C3432 Malignant neoplasm of lower lobe, left bronchus or lung: Secondary | ICD-10-CM | POA: Diagnosis not present

## 2019-04-12 DIAGNOSIS — Z791 Long term (current) use of non-steroidal anti-inflammatories (NSAID): Secondary | ICD-10-CM | POA: Diagnosis not present

## 2019-04-12 DIAGNOSIS — I10 Essential (primary) hypertension: Secondary | ICD-10-CM | POA: Diagnosis not present

## 2019-04-12 DIAGNOSIS — C3492 Malignant neoplasm of unspecified part of left bronchus or lung: Secondary | ICD-10-CM | POA: Diagnosis not present

## 2019-04-12 DIAGNOSIS — C3490 Malignant neoplasm of unspecified part of unspecified bronchus or lung: Secondary | ICD-10-CM

## 2019-04-12 DIAGNOSIS — Z79899 Other long term (current) drug therapy: Secondary | ICD-10-CM | POA: Diagnosis not present

## 2019-04-12 DIAGNOSIS — Z8673 Personal history of transient ischemic attack (TIA), and cerebral infarction without residual deficits: Secondary | ICD-10-CM | POA: Diagnosis not present

## 2019-04-12 NOTE — Patient Instructions (Signed)
Steps to Quit Smoking Smoking tobacco is the leading cause of preventable death. It can affect almost every organ in the body. Smoking puts you and people around you at risk for many serious, long-lasting (chronic) diseases. Quitting smoking can be hard, but it is one of the best things that you can do for your health. It is never too late to quit. How do I get ready to quit? When you decide to quit smoking, make a plan to help you succeed. Before you quit:  Pick a date to quit. Set a date within the next 2 weeks to give you time to prepare.  Write down the reasons why you are quitting. Keep this list in places where you will see it often.  Tell your family, friends, and co-workers that you are quitting. Their support is important.  Talk with your doctor about the choices that may help you quit.  Find out if your health insurance will pay for these treatments.  Know the people, places, things, and activities that make you want to smoke (triggers). Avoid them. What first steps can I take to quit smoking?  Throw away all cigarettes at home, at work, and in your car.  Throw away the things that you use when you smoke, such as ashtrays and lighters.  Clean your car. Make sure to empty the ashtray.  Clean your home, including curtains and carpets. What can I do to help me quit smoking? Talk with your doctor about taking medicines and seeing a counselor at the same time. You are more likely to succeed when you do both.  If you are pregnant or breastfeeding, talk with your doctor about counseling or other ways to quit smoking. Do not take medicine to help you quit smoking unless your doctor tells you to do so. To quit smoking: Quit right away  Quit smoking totally, instead of slowly cutting back on how much you smoke over a period of time.  Go to counseling. You are more likely to quit if you go to counseling sessions regularly. Take medicine You may take medicines to help you quit. Some  medicines need a prescription, and some you can buy over-the-counter. Some medicines may contain a drug called nicotine to replace the nicotine in cigarettes. Medicines may:  Help you to stop having the desire to smoke (cravings).  Help to stop the problems that come when you stop smoking (withdrawal symptoms). Your doctor may ask you to use:  Nicotine patches, gum, or lozenges.  Nicotine inhalers or sprays.  Non-nicotine medicine that is taken by mouth. Find resources Find resources and other ways to help you quit smoking and remain smoke-free after you quit. These resources are most helpful when you use them often. They include:  Online chats with a counselor.  Phone quitlines.  Printed self-help materials.  Support groups or group counseling.  Text messaging programs.  Mobile phone apps. Use apps on your mobile phone or tablet that can help you stick to your quit plan. There are many free apps for mobile phones and tablets as well as websites. Examples include Quit Guide from the CDC and smokefree.gov  What things can I do to make it easier to quit?   Talk to your family and friends. Ask them to support and encourage you.  Call a phone quitline (1-800-QUIT-NOW), reach out to support groups, or work with a counselor.  Ask people who smoke to not smoke around you.  Avoid places that make you want to smoke,   such as: ? Bars. ? Parties. ? Smoke-break areas at work.  Spend time with people who do not smoke.  Lower the stress in your life. Stress can make you want to smoke. Try these things to help your stress: ? Getting regular exercise. ? Doing deep-breathing exercises. ? Doing yoga. ? Meditating. ? Doing a body scan. To do this, close your eyes, focus on one area of your body at a time from head to toe. Notice which parts of your body are tense. Try to relax the muscles in those areas. How will I feel when I quit smoking? Day 1 to 3 weeks Within the first 24 hours,  you may start to have some problems that come from quitting tobacco. These problems are very bad 2-3 days after you quit, but they do not often last for more than 2-3 weeks. You may get these symptoms:  Mood swings.  Feeling restless, nervous, angry, or annoyed.  Trouble concentrating.  Dizziness.  Strong desire for high-sugar foods and nicotine.  Weight gain.  Trouble pooping (constipation).  Feeling like you may vomit (nausea).  Coughing or a sore throat.  Changes in how the medicines that you take for other issues work in your body.  Depression.  Trouble sleeping (insomnia). Week 3 and afterward After the first 2-3 weeks of quitting, you may start to notice more positive results, such as:  Better sense of smell and taste.  Less coughing and sore throat.  Slower heart rate.  Lower blood pressure.  Clearer skin.  Better breathing.  Fewer sick days. Quitting smoking can be hard. Do not give up if you fail the first time. Some people need to try a few times before they succeed. Do your best to stick to your quit plan, and talk with your doctor if you have any questions or concerns. Summary  Smoking tobacco is the leading cause of preventable death. Quitting smoking can be hard, but it is one of the best things that you can do for your health.  When you decide to quit smoking, make a plan to help you succeed.  Quit smoking right away, not slowly over a period of time.  When you start quitting, seek help from your doctor, family, or friends. This information is not intended to replace advice given to you by your health care provider. Make sure you discuss any questions you have with your health care provider. Document Released: 03/14/2009 Document Revised: 08/05/2018 Document Reviewed: 08/06/2018 Elsevier Patient Education  2020 Elsevier Inc.  

## 2019-04-12 NOTE — Progress Notes (Signed)
[facility] [phone]) [phone]   [phone]  OFFICE PROGRESS NOTE  [name], MD [place] [address]  DIAGNOSIS: Stage IA (T1a, N0, M0) non-small cell lung cancer, well-differentiated adenocarcinoma diagnosed in July 2015.  PRIOR THERAPY: Status post left VATS with left lower lobe wedge resection as well as left basilar segmentectomy and lymph node dissection under the care of Dr. Roxan Hockey on 01/29/2014.  CURRENT THERAPY: Observation.  INTERVAL HISTORY: Amy Dunn 78 y.o. female returns to the clinic today for follow-up visit.  The patient is doing fine today with no concerning complaints except for mild fatigue.  She denied having any recent chest pain, shortness of breath, cough or hemoptysis.  She denied having any fever or chills.  She has no nausea, vomiting, diarrhea or constipation.  She has no headache or visual changes.  She is here today for evaluation with repeat CT scan of the chest for restaging of her disease.  MEDICAL HISTORY: Past Medical History:  Diagnosis Date  . Acute peptic ulcer, unspecified site, with hemorrhage and perforation, without mention of obstruction   . Cancer of left lung ([place]) 2015  . Cough   . GERD (gastroesophageal reflux disease)   . Glaucoma    right eye  . History of colonic polyps   . Hypertension   . Osteoarthritis of left shoulder   . Primary osteoarthritis of left hip   . Pulmonary nodule, left    Left Lower lobe  . Stroke [facility])    no residual  . TIA (transient ischemic attack) 2011   transient blindness, speech- resolved    ALLERGIES:  is allergic to penicillins.  MEDICATIONS:  Current Outpatient Medications  Medication Sig Dispense Refill  . amLODipine (NORVASC) 5 MG tablet Take 1 tablet (5 mg total) by mouth daily. 90 tablet 3  . Cyanocobalamin (VITAMIN B12 PO) Take 1 tablet by mouth daily.    . dorzolamide-timolol (COSOPT) 22.3-6.8 MG/ML ophthalmic  solution Place 1 drop into the left eye daily.     . fexofenadine (ALLEGRA) 180 MG tablet Take 180 mg by mouth daily as needed. For allergies    . fluticasone (FLONASE) 50 MCG/ACT nasal spray Place 2 sprays into both nostrils daily as needed for allergies or rhinitis.    . hydrochlorothiazide (HYDRODIURIL) 25 MG tablet Take 1 tablet (25 mg total) by mouth daily. 90 tablet 3  . ibuprofen (ADVIL,MOTRIN) 600 MG tablet Take 1 tablet (600 mg total) by mouth every 8 (eight) hours as needed. 60 tablet 3  . losartan (COZAAR) 100 MG tablet Take 1 tablet (100 mg total) by mouth daily. 90 tablet 3  . metoprolol succinate (TOPROL-XL) 50 MG 24 hr tablet TAKE 1 TABLET BY MOUTH DAILY WITH OR IMMEDIATELY FOLLOWING A MEAL (Patient not taking: Reported on 03/08/2019) 90 tablet 3  . neomycin-polymyxin-hydrocortisone (CORTISPORIN) 3.5-10000-1 OTIC suspension Place 3 drops into both ears 3 (three) times daily. 10 mL 0  . omeprazole (PRILOSEC) 20 MG capsule Take 20 mg by mouth daily.    . promethazine-dextromethorphan (PROMETHAZINE-DM) 6.25-15 MG/5ML syrup Take 5 mLs by mouth 2 (two) times daily as needed for cough. 118 mL 0  . traMADol (ULTRAM) 50 MG tablet TAKE 1 TABLET BY MOUTH EVERY 12 HOURS AS NEEDED 60 tablet 2   No current facility-administered medications for this visit.     SURGICAL HISTORY:  Past Surgical History:  Procedure Laterality Date  . COLON SURGERY    .  EYE SURGERY Bilateral    cataracts  . surgical repair of peptic ulcer    . TUBAL LIGATION    . VIDEO ASSISTED THORACOSCOPY (VATS)/WEDGE RESECTION Left 01/29/2014   Procedure: VIDEO ASSISTED THORACOSCOPY (VATS)/BASILAR SEGMENTECTOMY  LEFT LUNG LOWER LOBE WITH FROZEN SECTION, LEFT LOWER LOBECTOMY;  Surgeon: Melrose Nakayama, MD;  Location: [facility];  Service: Thoracic;  Laterality: Left;    REVIEW OF SYSTEMS:  A comprehensive review of systems was negative.   PHYSICAL EXAMINATION: General appearance: alert, cooperative and no distress Head:  Normocephalic, without obvious abnormality, atraumatic Neck: no adenopathy, no JVD, supple, symmetrical, trachea midline and thyroid not enlarged, symmetric, no tenderness/mass/nodules Lymph nodes: Cervical, supraclavicular, and axillary nodes normal. Resp: clear to auscultation bilaterally Back: symmetric, no curvature. ROM normal. No CVA tenderness. Cardio: regular rate and rhythm, S1, S2 normal, no murmur, click, rub or gallop GI: soft, non-tender; bowel sounds normal; no masses,  no organomegaly Extremities: extremities normal, atraumatic, no cyanosis or edema  ECOG PERFORMANCE STATUS: 1 - Symptomatic but completely ambulatory  Blood pressure 125/68, pulse 73, temperature 98.2 F (36.8 C), temperature source Temporal, resp. rate 18, height 5\' 5"  (1.651 m), weight 125 lb 4.8 oz (56.8 kg), SpO2 96 %.  LABORATORY DATA: Lab Results  Component Value Date   WBC 7.3 04/10/2019   HGB 11.2 (L) 04/10/2019   HCT 31.6 (L) 04/10/2019   MCV 77.5 (L) 04/10/2019   PLT 199 04/10/2019      Chemistry      Component Value Date/Time   NA 140 04/10/2019 1040   NA 139 04/09/2017 0927   K 3.7 04/10/2019 1040   K 3.7 04/09/2017 0927   CL 100 04/10/2019 1040   CO2 29 04/10/2019 1040   CO2 29 04/09/2017 0927   BUN 18 04/10/2019 1040   BUN 18.7 04/09/2017 0927   CREATININE 1.46 (H) 04/10/2019 1040   CREATININE 1.3 (H) 04/09/2017 0927      Component Value Date/Time   CALCIUM 9.1 04/10/2019 1040   CALCIUM 9.4 04/09/2017 0927   ALKPHOS 87 04/10/2019 1040   ALKPHOS 95 04/09/2017 0927   AST 13 (L) 04/10/2019 1040   AST 16 04/09/2017 0927   ALT 8 04/10/2019 1040   ALT 12 04/09/2017 0927   BILITOT 0.4 04/10/2019 1040   BILITOT 0.58 04/09/2017 0927       RADIOGRAPHIC STUDIES: Ct Chest Wo Contrast  Result Date: 04/07/2019 CLINICAL DATA:  Non-small cell lung cancer staging, prior left lower lobe wedge resection. EXAM: CT CHEST WITHOUT CONTRAST TECHNIQUE: Multidetector CT imaging of the chest  was performed following the standard protocol without IV contrast. COMPARISON:  04/08/2018 FINDINGS: Cardiovascular: Atherosclerotic calcification of the aortic arch and branch vessels. Left for cardiac deviation due to volume loss in the left hemithorax. Mediastinum/Nodes: Small mediastinal lymph nodes are not pathologically enlarged. Primarily gas-filled esophagus with air-fluid level. Lungs/Pleura: Stable postoperative findings in the left lower lobe, no new nodularity or progression of local scarring. Mild subpleural nodularity laterally in the left upper lobe is stable. Upper Abdomen: Stable hypodense liver lesions, likely cysts or similar benign lesions. Bilateral renal hypodense lesions are again observed, probably cysts. One of these in the left mid upper kidney has some posterior calcification along its margin, as before. Musculoskeletal: Unremarkable IMPRESSION: 1. Stable postoperative findings in the left lower lobe. No findings of recurrent malignancy. 2. Mildly dilated gas-filled esophagus, query dysmotility. 3. Stable hypodense hepatic and renal lesions. 4.  Aortic Atherosclerosis (ICD10-I70.0). Electronically Signed   By:  Van Clines M.D.   On: 04/07/2019 14:39    ASSESSMENT AND PLAN: This is a very pleasant 78 years old [place] American female recently diagnosed with a stage IA non-small cell lung cancer status post left lower lobe wedge resection and currently on observation.  The patient is feeling fine today with no concerning complaints. She had repeat CT scan of the chest performed recently.  I personally and independently reviewed the scans and discussed the results with the patient today. Her scan showed no concerning findings for disease recurrence or metastasis. I recommended for her to continue on observation with repeat CT scan of the chest in 1 year. She was advised to call immediately if she has any concerning symptoms in the interval. The patient voices understanding of  current disease status and treatment options and is in agreement with the current care plan.  All questions were answered. The patient knows to call the clinic with any problems, questions or concerns. We can certainly see the patient much sooner if necessary.  Disclaimer: This note was dictated with voice recognition software. Similar sounding words can inadvertently be transcribed and may not be corrected upon review.

## 2019-04-13 ENCOUNTER — Telehealth: Payer: Self-pay | Admitting: Internal Medicine

## 2019-04-13 NOTE — Telephone Encounter (Signed)
Scheduled per los. Called and left [email or web address] printout  

## 2019-06-08 ENCOUNTER — Ambulatory Visit (INDEPENDENT_AMBULATORY_CARE_PROVIDER_SITE_OTHER): Payer: Medicare HMO | Admitting: Internal Medicine

## 2019-06-08 ENCOUNTER — Other Ambulatory Visit: Payer: Self-pay

## 2019-06-08 ENCOUNTER — Encounter: Payer: Self-pay | Admitting: Internal Medicine

## 2019-06-08 VITALS — BP 124/78 | HR 91 | Temp 98.3°F | Ht 65.0 in | Wt 126.0 lb

## 2019-06-08 DIAGNOSIS — M159 Polyosteoarthritis, unspecified: Secondary | ICD-10-CM

## 2019-06-08 DIAGNOSIS — M8949 Other hypertrophic osteoarthropathy, multiple sites: Secondary | ICD-10-CM

## 2019-06-08 MED ORDER — METHYLPREDNISOLONE ACETATE 40 MG/ML IJ SUSP
40.0000 mg | Freq: Once | INTRAMUSCULAR | Status: AC
  Administered 2019-06-08: 40 mg via INTRAMUSCULAR

## 2019-06-08 MED ORDER — DICLOFENAC SODIUM 1 % EX GEL: 2.0000 g | Freq: Four times a day (QID) | CUTANEOUS | 3 refills | Status: AC

## 2019-06-08 NOTE — Patient Instructions (Signed)
We have given you the shot today which can take 2-3 days to kick in.   We have sent in voltaren gel to use on the hip up to 4 times per day for pain.    Hip Bursitis Rehab Ask your health care provider which exercises are safe for you. Do exercises exactly as told by your health care provider and adjust them as directed. It is normal to feel mild stretching, pulling, tightness, or discomfort as you do these exercises. Stop right away if you feel sudden pain or your pain gets worse. Do not begin these exercises until told by your health care provider. Stretching exercise This exercise warms up your muscles and joints and improves the movement and flexibility of your hip. This exercise also helps to relieve pain and stiffness. Iliotibial band stretch An iliotibial band is a strong band of muscle tissue that runs from the outer side of your hip to the outer side of your thigh and knee. 1. Lie on your side with your left / right leg in the top position. 2. Bend your left / right knee and grab your ankle. Stretch out your bottom arm to help you balance. 3. Slowly bring your knee back so your thigh is behind your body. 4. Slowly lower your knee toward the floor until you feel a gentle stretch on the outside of your left / right thigh. If you do not feel a stretch and your knee will not fall farther, place the heel of your other foot on top of your knee and pull your knee down toward the floor with your foot. 5. Hold this position for __________ seconds. 6. Slowly return to the starting position. Repeat __________ times. Complete this exercise __________ times a day. Strengthening exercises These exercises build strength and endurance in your hip and pelvis. Endurance is the ability to use your muscles for a long time, even after they get tired. Bridge This exercise strengthens the muscles that move your thigh backward (hip extensors). 1. Lie on your back on a firm surface with your knees bent and  your feet flat on the floor. 2. Tighten your buttocks muscles and lift your buttocks off the floor until your trunk is level with your thighs. ? Do not arch your back. ? You should feel the muscles working in your buttocks and the back of your thighs. If you do not feel these muscles, slide your feet 1-2 inches (2.5-5 cm) farther away from your buttocks. ? If this exercise is too easy, try doing it with your arms crossed over your chest. 3. Hold this position for __________ seconds. 4. Slowly lower your hips to the starting position. 5. Let your muscles relax completely after each repetition. Repeat __________ times. Complete this exercise __________ times a day. Squats This exercise strengthens the muscles in front of your thigh and knee (quadriceps). 1. Stand in front of a table, with your feet and knees pointing straight ahead. You may rest your hands on the table for balance but not for support. 2. Slowly bend your knees and lower your hips like you are going to sit in a chair. ? Keep your weight over your heels, not over your toes. ? Keep your lower legs upright so they are parallel with the table legs. ? Do not let your hips go lower than your knees. ? Do not bend lower than told by your health care provider. ? If your hip pain increases, do not bend as low. 3. Hold the  squat position for __________ seconds. 4. Slowly push with your legs to return to standing. Do not use your hands to pull yourself to standing. Repeat __________ times. Complete this exercise __________ times a day. Hip hike 1. Stand sideways on a bottom step. Stand on your left / right leg with your other foot unsupported next to the step. You can hold on to the railing or wall for balance if needed. 2. Keep your knees straight and your torso square. Then lift your left / right hip up toward the ceiling. 3. Hold this position for __________ seconds. 4. Slowly let your left / right hip lower toward the floor, past the  starting position. Your foot should get closer to the floor. Do not lean or bend your knees. Repeat __________ times. Complete this exercise __________ times a day. Single leg stand 1. Without shoes, stand near a railing or in a doorway. You may hold on to the railing or door frame as needed for balance. 2. Squeeze your left / right buttock muscles, then lift up your other foot. ? Do not let your left / right hip push out to the side. ? It is helpful to stand in front of a mirror for this exercise so you can watch your hip. 3. Hold this position for __________ seconds. Repeat __________ times. Complete this exercise __________ times a day. This information is not intended to replace advice given to you by your health care provider. Make sure you discuss any questions you have with your health care provider. Document Revised: 09/12/2018 Document Reviewed: 09/12/2018 [employer] Patient Education  [employer].

## 2019-06-08 NOTE — Assessment & Plan Note (Signed)
Given depo-medrol 40 mg IM at visit and rx voltaren gel for pain. Can use heating pad. Given stretching exercises.

## 2019-06-08 NOTE — Progress Notes (Signed)
   Subjective:   Patient ID: Amy Dunn, female    DOB: Feb 13, 1941, 79 y.o.   MRN: [id number]  HPI The patient is an 79 YO female coming in for concerns about arthritis pain. Denies falls or injuries. Having pain in the right hip and low back more so. Overall it is worsening. Has tried tramadol for pain with some relief. Cannot take nsaids or tylenol as they irritate her stomach.   Review of Systems  Constitutional: Negative.   HENT: Negative.   Eyes: Negative.   Respiratory: Negative for cough, chest tightness and shortness of breath.   Cardiovascular: Negative for chest pain, palpitations and leg swelling.  Gastrointestinal: Negative for abdominal distention, abdominal pain, constipation, diarrhea, nausea and vomiting.  Musculoskeletal: Positive for arthralgias and back pain.  Skin: Negative.   Neurological: Negative.   Psychiatric/Behavioral: Negative.     Objective:  Physical Exam Constitutional:      Appearance: She is well-developed.  HENT:     Head: Normocephalic and atraumatic.  Cardiovascular:     Rate and Rhythm: Normal rate and regular rhythm.  Pulmonary:     Effort: Pulmonary effort is normal. No respiratory distress.     Breath sounds: Normal breath sounds. No wheezing or rales.  Abdominal:     General: Bowel sounds are normal. There is no distension.     Palpations: Abdomen is soft.     Tenderness: There is no abdominal tenderness. There is no rebound.  Musculoskeletal:        General: Tenderness present.     Cervical back: Normal range of motion.  Skin:    General: Skin is warm and dry.  Neurological:     Mental Status: She is alert and oriented to person, place, and time.     Coordination: Coordination normal.     Vitals:   06/08/19 0837  BP: 124/78  Pulse: 91  Temp: 98.3 F (36.8 C)  TempSrc: Oral  SpO2: 97%  Weight: 126 lb (57.2 kg)  Height: 5\' 5"  (1.651 m)    This visit occurred during the SARS-CoV-2 public health emergency.  Safety  protocols were in place, including screening questions prior to the visit, additional usage of staff PPE, and extensive cleaning of exam room while observing appropriate contact time as indicated for disinfecting solutions.   Assessment & Plan:  Depo-medrol 40 mg IM given at visit

## 2019-06-16 ENCOUNTER — Encounter (INDEPENDENT_AMBULATORY_CARE_PROVIDER_SITE_OTHER): Payer: Medicare HMO | Admitting: Ophthalmology

## 2019-07-17 ENCOUNTER — Encounter (INDEPENDENT_AMBULATORY_CARE_PROVIDER_SITE_OTHER): Payer: Medicare HMO | Admitting: Ophthalmology

## 2019-07-17 DIAGNOSIS — I10 Essential (primary) hypertension: Secondary | ICD-10-CM | POA: Diagnosis not present

## 2019-07-17 DIAGNOSIS — H43813 Vitreous degeneration, bilateral: Secondary | ICD-10-CM

## 2019-07-17 DIAGNOSIS — H348312 Tributary (branch) retinal vein occlusion, right eye, stable: Secondary | ICD-10-CM | POA: Diagnosis not present

## 2019-07-17 DIAGNOSIS — H35033 Hypertensive retinopathy, bilateral: Secondary | ICD-10-CM

## 2019-07-18 DIAGNOSIS — R69 Illness, unspecified: Secondary | ICD-10-CM | POA: Diagnosis not present

## 2019-08-04 ENCOUNTER — Other Ambulatory Visit: Payer: Self-pay | Admitting: Internal Medicine

## 2019-08-07 ENCOUNTER — Other Ambulatory Visit: Payer: Self-pay

## 2019-08-07 ENCOUNTER — Ambulatory Visit (INDEPENDENT_AMBULATORY_CARE_PROVIDER_SITE_OTHER): Payer: Medicare HMO | Admitting: Internal Medicine

## 2019-08-07 ENCOUNTER — Encounter: Payer: Self-pay | Admitting: Internal Medicine

## 2019-08-07 ENCOUNTER — Ambulatory Visit (INDEPENDENT_AMBULATORY_CARE_PROVIDER_SITE_OTHER): Payer: Medicare HMO

## 2019-08-07 VITALS — BP 136/88 | HR 97 | Temp 98.3°F | Ht 65.0 in | Wt 128.0 lb

## 2019-08-07 DIAGNOSIS — M25512 Pain in left shoulder: Secondary | ICD-10-CM

## 2019-08-07 DIAGNOSIS — S299XXA Unspecified injury of thorax, initial encounter: Secondary | ICD-10-CM | POA: Diagnosis not present

## 2019-08-07 DIAGNOSIS — R0781 Pleurodynia: Secondary | ICD-10-CM

## 2019-08-07 DIAGNOSIS — S4992XA Unspecified injury of left shoulder and upper arm, initial encounter: Secondary | ICD-10-CM | POA: Diagnosis not present

## 2019-08-07 DIAGNOSIS — M79605 Pain in left leg: Secondary | ICD-10-CM

## 2019-08-07 MED ORDER — TRAMADOL HCL 50 MG PO TABS: 50.0000 mg | ORAL_TABLET | Freq: Two times a day (BID) | ORAL | 2 refills | Status: DC | PRN

## 2019-08-07 MED ORDER — METHOCARBAMOL 500 MG PO TABS: 500.0000 mg | ORAL_TABLET | Freq: Three times a day (TID) | ORAL | 0 refills | Status: DC | PRN

## 2019-08-07 NOTE — Patient Instructions (Signed)
We have sent in a muscle relaxer called methocarbamol to use for pain if needed up to 3 times a day.   The tramadol is also okay to use for pain we have refilled that.   You can use heat also for pain.  We are doing the x-ray today and this should get better in 2-4 weeks.

## 2019-08-07 NOTE — Assessment & Plan Note (Signed)
Checking x-ray ribs to assess for fracture. Refill tramadol and rx methocarbamol to help with pain and given likely timeline for recovery.
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
## 2019-08-07 NOTE — Assessment & Plan Note (Signed)
Some bruising on the skin and likely muscular pain. No evidence for fracture on exam. No x-ray needed. Rx methocarbamol and refill tramadol for pain.
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
## 2020-01-03 ENCOUNTER — Telehealth: Payer: Self-pay | Admitting: Internal Medicine

## 2020-01-03 NOTE — Telephone Encounter (Signed)
Patient states she had an appointment on Monday 01/08/20 with her dentist. She is requested a letter stating it is ok to have her teeth pulled. She would like to pick up letter today.   Informed patient Dr.Crawford has already left today and she may need an appointment to get the letter completed. LOV : 08/07/2019.  Please advise. Thank you.

## 2020-01-04 ENCOUNTER — Encounter: Payer: Self-pay | Admitting: Internal Medicine

## 2020-01-04 NOTE — Telephone Encounter (Signed)
Fine to do note

## 2020-01-04 NOTE — Telephone Encounter (Signed)
Letter completed, Provider signed.   Patient informed and will pick up letter tomorrow.(8/6)

## 2020-02-08 ENCOUNTER — Ambulatory Visit: Payer: Self-pay

## 2020-02-15 ENCOUNTER — Other Ambulatory Visit: Payer: Self-pay | Admitting: Internal Medicine

## 2020-02-15 NOTE — Telephone Encounter (Signed)
Done erx 

## 2020-02-22 ENCOUNTER — Ambulatory Visit (INDEPENDENT_AMBULATORY_CARE_PROVIDER_SITE_OTHER): Payer: Medicare HMO

## 2020-02-22 DIAGNOSIS — Z0000 Encounter for general adult medical examination without abnormal findings: Secondary | ICD-10-CM | POA: Diagnosis not present

## 2020-02-22 NOTE — Progress Notes (Addendum)
I connected with Amy Dunn today by telephone and verified that I am speaking with the correct person using two identifiers. Location patient: home Location provider: work Persons participating in the virtual visit: Amy Dunn and Amy Abu, LPN.   I discussed the limitations, risks, security and privacy concerns of performing an evaluation and management service by telephone and the availability of in person appointments. I also discussed with the patient that there may be a patient responsible charge related to this service. The patient expressed understanding and verbally consented to this telephonic visit.    Interactive audio and video telecommunications were attempted between this provider and patient, however failed, due to patient having technical difficulties OR patient did not have access to video capability.  We continued and completed visit with audio only.  Some vital signs may be absent or patient reported.   Time Spent with patient on telephone encounter: 25 minutes  Subjective:   Amy Dunn is a 79 y.o. female who presents for Medicare Annual (Subsequent) preventive examination.  Review of Systems    No ROS. Medicare Wellness Visit Cardiac Risk Factors include: advanced age (>49men, >54 women);family history of premature cardiovascular disease;hypertension Sleep Patterns: No sleep issues, feels rested on waking and sleeps 6-8 hours nightly. Home Safety/Smoke Alarms: Feels safe in home; uses home alarm. Smoke alarms in place. Living environment: 2-story home; Lives with spouse; no needs for DME; good support system. Seat Belt Safety/Bike Helmet: Wears seat belt.    Objective:    There were no vitals filed for this visit. There is no height or weight on file to calculate BMI.  Advanced Directives 02/22/2020 02/02/2019 04/15/2016 04/09/2015 10/01/2014 03/29/2014 01/29/2014  Does Patient Have a Medical Advance Directive? No No No No No No No  Would patient like  information on creating a medical advance directive? No - Patient declined Yes (ED - Information included in AVS) No - patient declined information No - patient declined information No - patient declined information No - patient declined information No - patient declined information    Current Medications (verified) Outpatient Encounter Medications as of 02/22/2020  Medication Sig  . amLODipine (NORVASC) 5 MG tablet Take 1 tablet (5 mg total) by mouth daily.  . Cyanocobalamin (VITAMIN B12 PO) Take 1 tablet by mouth daily.  . diclofenac Sodium (VOLTAREN) 1 % GEL Apply 2 g topically 4 (four) times daily.  . dorzolamide-timolol (COSOPT) 22.3-6.8 MG/ML ophthalmic solution Place 1 drop into the left eye daily.   . fexofenadine (ALLEGRA) 180 MG tablet Take 180 mg by mouth daily as needed. For allergies  . fluticasone (FLONASE) 50 MCG/ACT nasal spray Place 2 sprays into both nostrils daily as needed for allergies or rhinitis.  . hydrochlorothiazide (HYDRODIURIL) 25 MG tablet Take 1 tablet (25 mg total) by mouth daily.  Marland Kitchen ibuprofen (ADVIL,MOTRIN) 600 MG tablet Take 1 tablet (600 mg total) by mouth every 8 (eight) hours as needed.  Marland Kitchen losartan (COZAAR) 100 MG tablet Take 1 tablet (100 mg total) by mouth daily.  . methocarbamol (ROBAXIN) 500 MG tablet Take 1 tablet (500 mg total) by mouth every 8 (eight) hours as needed for muscle spasms.  . metoprolol succinate (TOPROL-XL) 50 MG 24 hr tablet TAKE 1 TABLET BY MOUTH DAILY WITH OR IMMEDIATELY FOLLOWING A MEAL  . neomycin-polymyxin-hydrocortisone (CORTISPORIN) 3.5-10000-1 OTIC suspension Place 3 drops into both ears 3 (three) times daily.  Marland Kitchen omeprazole (PRILOSEC) 20 MG capsule Take 20 mg by mouth daily.  . promethazine-dextromethorphan (PROMETHAZINE-DM) 6.25-15  MG/5ML syrup Take 5 mLs by mouth 2 (two) times daily as needed for cough.  . traMADol (ULTRAM) 50 MG tablet TAKE 1 TABLET BY MOUTH EVERY 12 HOURS AS NEEDED   No facility-administered encounter  medications on file as of 02/22/2020.    Allergies (verified) Penicillins   History: Past Medical History:  Diagnosis Date  . Acute peptic ulcer, unspecified site, with hemorrhage and perforation, without mention of obstruction   . Cancer of left lung ([place]) 2015  . Cough   . GERD (gastroesophageal reflux disease)   . Glaucoma    right eye  . History of colonic polyps   . Hypertension   . Osteoarthritis of left shoulder   . Primary osteoarthritis of left hip   . Pulmonary nodule, left    Left Lower lobe  . Stroke [facility])    no residual  . TIA (transient ischemic attack) 2011   transient blindness, speech- resolved   Past Surgical History:  Procedure Laterality Date  . COLON SURGERY    . EYE SURGERY Bilateral    cataracts  . surgical repair of peptic ulcer    . TUBAL LIGATION    . VIDEO ASSISTED THORACOSCOPY (VATS)/WEDGE RESECTION Left 01/29/2014   Procedure: VIDEO ASSISTED THORACOSCOPY (VATS)/BASILAR SEGMENTECTOMY  LEFT LUNG LOWER LOBE WITH FROZEN SECTION, LEFT LOWER LOBECTOMY;  Surgeon: Melrose Nakayama, MD;  Location: [place];  Service: Thoracic;  Laterality: Left;   Family History  Problem Relation Age of Onset  . Hypertension Mother   . Heart disease Mother   . Heart attack Mother   . Cancer Father        Colon cancer   Social History   Socioeconomic History  . Marital status: Married    Spouse name: Not on file  . Number of children: 2  . Years of education: Not on file  . Highest education level: Not on file  Occupational History  . Occupation: retired  Tobacco Use  . Smoking status: Former Smoker    Years: 1.00    Types: Cigarettes    Quit date: 05/26/1981    Years since quitting: 38.7  . Smokeless tobacco: Never Used  . Tobacco comment: 2 ciggs per day  Vaping Use  . Vaping Use: Never used  Substance and Sexual Activity  . Alcohol use: No  . Drug use: No  . Sexual activity: Not Currently  Other Topics Concern  . Not on file  Social History  Narrative   HSG   Married- '62   2 daughters- '65, '70; 3 grandchildren   Occupation retired- Lawyer of life: No CPR, no intubation, no heroic measures (pt provided end of life packet)   Social Determinants of Health   Financial Resource Strain: [place]   . Difficulty of Paying Living Expenses: Not hard at all  Food Insecurity: No Food Insecurity  . Worried About Charity fundraiser in the Last Year: Never true  . Ran Out of Food in the Last Year: Never true  Transportation Needs: No Transportation Needs  . Lack of Transportation (Medical): No  . Lack of Transportation (Non-Medical): No  Physical Activity: Sufficiently Active  . Days of Exercise per Week: 5 days  . Minutes of Exercise per Session: 30 min  Stress: No Stress Concern Present  . Feeling of Stress : Not at all  Social Connections: Moderately Integrated  . Frequency of Communication with Friends and Family: More than three times a week  .  Frequency of Social Gatherings with Friends and Family: Once a week  . Attends Religious Services: Never  . Active Member of Clubs or Organizations: Yes  . Attends Archivist Meetings: Never  . Marital Status: Married    Tobacco Counseling Counseling given: Not Answered Comment: 2 ciggs per day   Clinical Intake:  Pre-visit preparation completed: Yes  Pain : No/denies pain     Nutritional Risks: None Diabetes: No  How often do you need to have someone help you when you read instructions, pamphlets, or other written materials from your doctor or pharmacy?: 1 - Never What is the last grade level you completed in school?: HSG  Diabetic? no  Interpreter Needed?: No  Information entered by :: Amy Abu, LPN   Activities of Daily Living In your present state of health, do you have any difficulty performing the following activities: 02/22/2020  Hearing? Y  Vision? N  Difficulty concentrating or making decisions? N  Walking or climbing  stairs? N  Comment lives in a 2-story home  Dressing or bathing? N  Doing errands, shopping? N  Preparing Food and eating ? N  Using the Toilet? N  In the past six months, have you accidently leaked urine? N  Do you have problems with loss of bowel control? N  Managing your Medications? N  Managing your Finances? N  Housekeeping or managing your Housekeeping? N  Some recent data might be hidden    Patient Care Team: [name], MD as PCP - General (Internal Medicine) Curt Bears, MD as Consulting Physician (Oncology) Clent Jacks, MD as Consulting Physician (Ophthalmology)  Indicate any recent Medical Services you may have received from other than [facility] providers in the past year (date may be approximate).     Assessment:   This is a routine wellness examination for [facility].  Hearing/Vision screen No exam data present  Dietary issues and exercise activities discussed: Current Exercise Habits: Home exercise routine, Type of exercise: walking;Other - see comments (yard work, gardening; going up and down stairs in her home every day), Time (Minutes): 30, Frequency (Times/Week): 5, Weekly Exercise (Minutes/Week): 150, Intensity: Moderate, Exercise limited by: orthopedic condition(s)  Goals    . Patient Stated     Continue to enjoy doing yard work and gardening. Stay as active as possible, eat healthy and continue to ride stationary bicycle.      Depression Screen PHQ 2/9 Scores 02/22/2020 03/08/2019 02/02/2019 05/24/2017 04/01/2016 02/01/2015  PHQ - 2 Score 0 0 0 0 0 0    Fall Risk Fall Risk  02/22/2020 03/08/2019 02/02/2019 05/24/2017 04/01/2016  Falls in the past year? 0 0 0 No No  Number falls in past yr: 0 0 0 - -  Injury with Fall? 0 0 0 - -  Risk for fall due to : No Fall Risks - - - -    Any stairs in or around the home? Yes  If so, are there any without handrails? No  Home free of loose throw rugs in walkways, pet beds, electrical cords, etc? Yes  Adequate  lighting in your home to reduce risk of falls? Yes   ASSISTIVE DEVICES UTILIZED TO PREVENT FALLS:  Life alert? No  Use of a cane, walker or w/c? No  Grab bars in the bathroom? No  Shower chair or bench in shower? No  Elevated toilet seat or a handicapped toilet? No   TIMED UP AND GO:  Was the test performed? No .  Length of  time to ambulate 10 feet: 0 sec.   Gait steady and fast without use of assistive device  Cognitive Function:     6CIT Screen 02/02/2019  What Year? 0 points  What month? 0 points  What time? 0 points  Count back from 20 2 points  Months in reverse 2 points  Repeat phrase 2 points  Total Score 6    Immunizations Immunization History  Administered Date(s) Administered  . Fluad Quad(high Dose 65+) 02/02/2019  . Influenza Whole 04/16/2009, 02/17/2010, 03/02/2011  . Influenza, High Dose Seasonal PF 03/14/2013, 03/10/2016, 03/05/2017, 03/30/2018  . Influenza-Unspecified 03/19/2014, 01/23/2015  . PFIZER SARS-COV-2 Vaccination 07/15/2019, 08/09/2019  . Pneumococcal Conjugate-13 04/01/2015  . Pneumococcal Polysaccharide-23 02/17/2010  . Td 03/20/2010    TDAP status: Due, Education has been provided regarding the importance of this vaccine. Advised may receive this vaccine at local pharmacy or Health Dept. Aware to provide a copy of the vaccination record if obtained from local pharmacy or Health Dept. Verbalized acceptance and understanding. Flu Vaccine status: Up to date Pneumococcal vaccine status: Up to date Covid-19 vaccine status: Completed vaccines  Qualifies for Shingles Vaccine? Yes   Zostavax completed No   Shingrix Completed?: No.    Education has been provided regarding the importance of this vaccine. Patient has been advised to call insurance company to determine out of pocket expense if they have not yet received this vaccine. Advised may also receive vaccine at local pharmacy or Health Dept. Verbalized acceptance and understanding.  Screening  Tests Health Maintenance  Topic Date Due  . INFLUENZA VACCINE  12/31/2019  . TETANUS/TDAP  03/20/2020  . COVID-19 Vaccine  Completed  . PNA vac Low Risk Adult  Completed  . DEXA SCAN  Addressed    Health Maintenance  Health Maintenance Due  Topic Date Due  . INFLUENZA VACCINE  12/31/2019    Colorectal cancer screening: No longer required.  Mammogram status: No longer required.  Bone Density status: No longer required   Lung Cancer Screening: (Low Dose CT Chest recommended if Age 9-80 years, 30 pack-year currently smoking OR have quit w/in 15years.) does not qualify.   Lung Cancer Screening Referral: no  Additional Screening:  Hepatitis C Screening: does not qualify; Completed no  Vision Screening: Recommended annual ophthalmology exams for early detection of glaucoma and other disorders of the eye. Is the patient up to date with their annual eye exam?  Yes  Who is the provider or what is the name of the office in which the patient attends annual eye exams? Clent Jacks, MD If pt is not established with a provider, would they like to be referred to a provider to establish care? No .   Dental Screening: Recommended annual dental exams for proper oral hygiene  Community Resource Referral / Chronic Care Management: CRR required this visit?  No   CCM required this visit?  No      Plan:     I have personally reviewed and noted the following in the patient's chart:   . Medical and social history . Use of alcohol, tobacco or illicit drugs  . Current medications and supplements . Functional ability and status . Nutritional status . Physical activity . Advanced directives . List of other physicians . Hospitalizations, surgeries, and ER visits in previous 12 months . Vitals . Screenings to include cognitive, depression, and falls . Referrals and appointments  In addition, I have reviewed and discussed with patient certain preventive protocols, quality metrics, and  best  practice recommendations. A written personalized care plan for preventive services as well as general preventive health recommendations were provided to patient.     Sheral Flow, LPN   9/92/7800   Nurse Notes:  Patient is cogitatively intact. There were no vitals filed for this visit. There is no height or weight on file to calculate BMI. Patient stated that she has no issues with gait or balance; does not use any assistive devices.  Medical screening examination/treatment/procedure(s) were performed by non-physician practitioner and as supervising physician I was immediately available for consultation/collaboration. I agree with above. Lew Dawes, MD

## 2020-02-22 NOTE — Patient Instructions (Addendum)
Ms. Amy Dunn , Thank you for taking time to come for your Medicare Wellness Visit. I appreciate your ongoing commitment to your health goals. Please review the following plan we discussed and let me know if I can assist you in the future.   Screening recommendations/referrals: Colonoscopy: 03/20/2009; no longer a candidate for colon cancer screening  Mammogram: never done Bone Density: 03/20/2014 Recommended yearly ophthalmology/optometry visit for glaucoma screening and checkup Recommended yearly dental visit for hygiene and checkup  Vaccinations: Influenza vaccine: 02/02/2019 Pneumococcal vaccine: up to date Tdap vaccine: 03/20/2010; overdue Shingles vaccine: never done   Covid-19: up to date  Advanced directives: Advance directive discussed with you today. Even though you declined this today please call our office should you change your mind and we can give you the proper paperwork for you to fill out.  Conditions/risks identified: Yes; Reviewed health maintenance screenings with patient today and relevant education, vaccines, and/or referrals were provided. Please continue to do your personal lifestyle choices by: daily care of teeth and gums, regular physical activity (goal should be 5 days a week for 30 minutes), eat a healthy diet, avoid tobacco and drug use, limiting any alcohol intake, taking a low-dose aspirin (if not allergic or have been advised by your provider otherwise) and taking vitamins and minerals as recommended by your provider. Continue doing brain stimulating activities (puzzles, reading, adult coloring books, staying active) to keep memory sharp. Continue to eat heart healthy diet (full of fruits, vegetables, whole grains, lean protein, water--limit salt, fat, and sugar intake) and increase physical activity as tolerated.  Next appointment:  Please schedule your next Medicare Wellness Visit with your Nurse Health Advisor in 1 year.   Preventive Care 7 Years and Older,  Female Preventive care refers to lifestyle choices and visits with your health care provider that can promote health and wellness. What does preventive care include?  A yearly physical exam. This is also called an annual well check.  Dental exams once or twice a year.  Routine eye exams. Ask your health care provider how often you should have your eyes checked.  Personal lifestyle choices, including:  Daily care of your teeth and gums.  Regular physical activity.  Eating a healthy diet.  Avoiding tobacco and drug use.  Limiting alcohol use.  Practicing safe sex.  Taking low-dose aspirin every day.  Taking vitamin and mineral supplements as recommended by your health care provider. What happens during an annual well check? The services and screenings done by your health care provider during your annual well check will depend on your age, overall health, lifestyle risk factors, and family history of disease. Counseling  Your health care provider may ask you questions about your:  Alcohol use.  Tobacco use.  Drug use.  Emotional well-being.  Home and relationship well-being.  Sexual activity.  Eating habits.  History of falls.  Memory and ability to understand (cognition).  Work and work Statistician.  Reproductive health. Screening  You may have the following tests or measurements:  Height, weight, and BMI.  Blood pressure.  Lipid and cholesterol levels. These may be checked every 5 years, or more frequently if you are over 36 years old.  Skin check.  Lung cancer screening. You may have this screening every year starting at age 7 if you have a 30-pack-year history of smoking and currently smoke or have quit within the past 15 years.  Fecal occult blood test (FOBT) of the stool. You may have this test every year starting  at age 72.  Flexible sigmoidoscopy or colonoscopy. You may have a sigmoidoscopy every 5 years or a colonoscopy every 10 years  starting at age 88.  Hepatitis C blood test.  Hepatitis B blood test.  Sexually transmitted disease (STD) testing.  Diabetes screening. This is done by checking your blood sugar (glucose) after you have not eaten for a while (fasting). You may have this done every 1-3 years.  Bone density scan. This is done to screen for osteoporosis. You may have this done starting at age 68.  Mammogram. This may be done every 1-2 years. Talk to your health care provider about how often you should have regular mammograms. Talk with your health care provider about your test results, treatment options, and if necessary, the need for more tests. Vaccines  Your health care provider may recommend certain vaccines, such as:  Influenza vaccine. This is recommended every year.  Tetanus, diphtheria, and acellular pertussis (Tdap, Td) vaccine. You may need a Td booster every 10 years.  Zoster vaccine. You may need this after age 80.  Pneumococcal 13-valent conjugate (PCV13) vaccine. One dose is recommended after age 35.  Pneumococcal polysaccharide (PPSV23) vaccine. One dose is recommended after age 42. Talk to your health care provider about which screenings and vaccines you need and how often you need them. This information is not intended to replace advice given to you by your health care provider. Make sure you discuss any questions you have with your health care provider. Document Released: 06/14/2015 Document Revised: 02/05/2016 Document Reviewed: 03/19/2015 Elsevier Interactive Patient Education  2017 [place] Prevention in the Home Falls can cause injuries. They can happen to people of all ages. There are many things you can do to make your home safe and to help prevent falls. What can I do on the outside of my home?  Regularly fix the edges of walkways and driveways and fix any cracks.  Remove anything that might make you trip as you walk through a door, such as a raised step or  threshold.  Trim any bushes or trees on the path to your home.  Use bright outdoor lighting.  Clear any walking paths of anything that might make someone trip, such as rocks or tools.  Regularly check to see if handrails are loose or broken. Make sure that both sides of any steps have handrails.  Any raised decks and porches should have guardrails on the edges.  Have any leaves, snow, or ice cleared regularly.  Use sand or salt on walking paths during winter.  Clean up any spills in your garage right away. This includes oil or grease spills. What can I do in the bathroom?  Use night lights.  Install grab bars by the toilet and in the tub and shower. Do not use towel bars as grab bars.  Use non-skid mats or decals in the tub or shower.  If you need to sit down in the shower, use a plastic, non-slip stool.  Keep the floor dry. Clean up any water that spills on the floor as soon as it happens.  Remove soap buildup in the tub or shower regularly.  Attach bath mats securely with double-sided non-slip rug tape.  Do not have throw rugs and other things on the floor that can make you trip. What can I do in the bedroom?  Use night lights.  Make sure that you have a light by your bed that is easy to reach.  Do not use  any sheets or blankets that are too big for your bed. They should not hang down onto the floor.  Have a firm chair that has side arms. You can use this for support while you get dressed.  Do not have throw rugs and other things on the floor that can make you trip. What can I do in the kitchen?  Clean up any spills right away.  Avoid walking on wet floors.  Keep items that you use a lot in easy-to-reach places.  If you need to reach something above you, use a strong step stool that has a grab bar.  Keep electrical cords out of the way.  Do not use floor polish or wax that makes floors slippery. If you must use wax, use non-skid floor wax.  Do not have  throw rugs and other things on the floor that can make you trip. What can I do with my stairs?  Do not leave any items on the stairs.  Make sure that there are handrails on both sides of the stairs and use them. Fix handrails that are broken or loose. Make sure that handrails are as long as the stairways.  Check any carpeting to make sure that it is firmly attached to the stairs. Fix any carpet that is loose or worn.  Avoid having throw rugs at the top or bottom of the stairs. If you do have throw rugs, attach them to the floor with carpet tape.  Make sure that you have a light switch at the top of the stairs and the bottom of the stairs. If you do not have them, ask someone to add them for you. What else can I do to help prevent falls?  Wear shoes that:  Do not have high heels.  Have rubber bottoms.  Are comfortable and fit you well.  Are closed at the toe. Do not wear sandals.  If you use a stepladder:  Make sure that it is fully opened. Do not climb a closed stepladder.  Make sure that both sides of the stepladder are locked into place.  Ask someone to hold it for you, if possible.  Clearly mark and make sure that you can see:  Any grab bars or handrails.  First and last steps.  Where the edge of each step is.  Use tools that help you move around (mobility aids) if they are needed. These include:  Canes.  Walkers.  Scooters.  Crutches.  Turn on the lights when you go into a dark area. Replace any light bulbs as soon as they burn out.  Set up your furniture so you have a clear path. Avoid moving your furniture around.  If any of your floors are uneven, fix them.  If there are any pets around you, be aware of where they are.  Review your medicines with your doctor. Some medicines can make you feel dizzy. This can increase your chance of falling. Ask your doctor what other things that you can do to help prevent falls. This information is not intended to  replace advice given to you by your health care provider. Make sure you discuss any questions you have with your health care provider. Document Released: 03/14/2009 Document Revised: 10/24/2015 Document Reviewed: 06/22/2014 Elsevier Interactive Patient Education  2017 [employer].

## 2020-02-27 DIAGNOSIS — R69 Illness, unspecified: Secondary | ICD-10-CM | POA: Diagnosis not present

## 2020-03-05 ENCOUNTER — Other Ambulatory Visit: Payer: Self-pay | Admitting: Internal Medicine

## 2020-03-05 DIAGNOSIS — L218 Other seborrheic dermatitis: Secondary | ICD-10-CM | POA: Diagnosis not present

## 2020-03-18 DIAGNOSIS — R69 Illness, unspecified: Secondary | ICD-10-CM | POA: Diagnosis not present

## 2020-03-19 ENCOUNTER — Other Ambulatory Visit: Payer: Self-pay | Admitting: Internal Medicine

## 2020-03-25 DIAGNOSIS — R69 Illness, unspecified: Secondary | ICD-10-CM | POA: Diagnosis not present

## 2020-04-02 DIAGNOSIS — L218 Other seborrheic dermatitis: Secondary | ICD-10-CM | POA: Diagnosis not present

## 2020-04-05 ENCOUNTER — Ambulatory Visit (HOSPITAL_COMMUNITY): Payer: Medicare HMO

## 2020-04-09 ENCOUNTER — Other Ambulatory Visit: Payer: Medicare HMO

## 2020-04-09 ENCOUNTER — Telehealth: Payer: Self-pay | Admitting: Internal Medicine

## 2020-04-09 NOTE — Telephone Encounter (Signed)
Called pt per 11/9 sch msg - pt is aware of appt date and time

## 2020-04-11 ENCOUNTER — Ambulatory Visit: Payer: Medicare HMO | Admitting: Internal Medicine

## 2020-04-15 ENCOUNTER — Ambulatory Visit (HOSPITAL_COMMUNITY)
Admission: RE | Admit: 2020-04-15 | Discharge: 2020-04-15 | Disposition: A | Discharge status: Home / Self Care | Payer: Medicare HMO | Source: Ambulatory Visit | Attending: Internal Medicine | Admitting: Internal Medicine

## 2020-04-15 ENCOUNTER — Inpatient Hospital Stay: Payer: Medicare HMO | Attending: Internal Medicine

## 2020-04-15 ENCOUNTER — Other Ambulatory Visit: Payer: Self-pay

## 2020-04-15 DIAGNOSIS — C3490 Malignant neoplasm of unspecified part of unspecified bronchus or lung: Secondary | ICD-10-CM | POA: Insufficient documentation

## 2020-04-15 DIAGNOSIS — Z902 Acquired absence of lung [part of]: Secondary | ICD-10-CM | POA: Insufficient documentation

## 2020-04-15 DIAGNOSIS — H409 Unspecified glaucoma: Secondary | ICD-10-CM | POA: Diagnosis not present

## 2020-04-15 DIAGNOSIS — I10 Essential (primary) hypertension: Secondary | ICD-10-CM | POA: Insufficient documentation

## 2020-04-15 DIAGNOSIS — Z85118 Personal history of other malignant neoplasm of bronchus and lung: Secondary | ICD-10-CM | POA: Insufficient documentation

## 2020-04-15 DIAGNOSIS — Z8673 Personal history of transient ischemic attack (TIA), and cerebral infarction without residual deficits: Secondary | ICD-10-CM | POA: Diagnosis not present

## 2020-04-15 DIAGNOSIS — K219 Gastro-esophageal reflux disease without esophagitis: Secondary | ICD-10-CM | POA: Insufficient documentation

## 2020-04-15 DIAGNOSIS — Z79899 Other long term (current) drug therapy: Secondary | ICD-10-CM | POA: Diagnosis not present

## 2020-04-15 DIAGNOSIS — I2510 Atherosclerotic heart disease of native coronary artery without angina pectoris: Secondary | ICD-10-CM | POA: Diagnosis not present

## 2020-04-15 DIAGNOSIS — J984 Other disorders of lung: Secondary | ICD-10-CM | POA: Diagnosis not present

## 2020-04-15 DIAGNOSIS — J479 Bronchiectasis, uncomplicated: Secondary | ICD-10-CM | POA: Diagnosis not present

## 2020-04-15 LAB — CMP (CANCER CENTER ONLY)
ALT: 10 U/L (ref 0–44)
AST: 15 U/L (ref 15–41)
Albumin: 4 g/dL (ref 3.5–5.0)
Alkaline Phosphatase: 90 U/L (ref 38–126)
Anion gap: 9 (ref 5–15)
BUN: 21 mg/dL (ref 8–23)
CO2: 30 mmol/L (ref 22–32)
Calcium: 9.5 mg/dL (ref 8.9–10.3)
Chloride: 101 mmol/L (ref 98–111)
Creatinine: 1.77 mg/dL — ABNORMAL HIGH (ref 0.44–1.00)
GFR, Estimated: 29 mL/min — ABNORMAL LOW (ref >60)
Glucose, Bld: 104 mg/dL — ABNORMAL HIGH (ref 70–99)
Potassium: 3.4 mmol/L — ABNORMAL LOW (ref 3.5–5.1)
Sodium: 140 mmol/L (ref 135–145)
Total Bilirubin: 0.7 mg/dL (ref 0.3–1.2)
Total Protein: 7.9 g/dL (ref 6.5–8.1)

## 2020-04-15 LAB — CBC WITH DIFFERENTIAL (CANCER CENTER ONLY)
Abs Immature Granulocytes: 0.03 10*3/uL (ref 0.00–0.07)
Basophils Absolute: 0.1 10*3/uL (ref 0.0–0.1)
Basophils Relative: 1 %
Eosinophils Absolute: 0.1 10*3/uL (ref 0.0–0.5)
Eosinophils Relative: 1 %
HCT: 33.4 % — ABNORMAL LOW (ref 36.0–46.0)
Hemoglobin: 11.7 g/dL — ABNORMAL LOW (ref 12.0–15.0)
Immature Granulocytes: 0 %
Lymphocytes Relative: 26 %
Lymphs Abs: 2 10*3/uL (ref 0.7–4.0)
MCH: 27.5 pg (ref 26.0–34.0)
MCHC: 35 g/dL (ref 30.0–36.0)
MCV: 78.6 fL — ABNORMAL LOW (ref 80.0–100.0)
Monocytes Absolute: 0.7 10*3/uL (ref 0.1–1.0)
Monocytes Relative: 9 %
Neutro Abs: 5 10*3/uL (ref 1.7–7.7)
Neutrophils Relative %: 63 %
Platelet Count: 208 10*3/uL (ref 150–400)
RBC: 4.25 MIL/uL (ref 3.87–5.11)
RDW: 14.3 % (ref 11.5–15.5)
WBC Count: 7.9 10*3/uL (ref 4.0–10.5)
nRBC: 0 % (ref 0.0–0.2)

## 2020-04-17 ENCOUNTER — Inpatient Hospital Stay: Payer: Medicare HMO | Admitting: Internal Medicine

## 2020-04-17 ENCOUNTER — Other Ambulatory Visit: Payer: Self-pay

## 2020-04-17 ENCOUNTER — Encounter: Payer: Self-pay | Admitting: Internal Medicine

## 2020-04-17 DIAGNOSIS — Z85118 Personal history of other malignant neoplasm of bronchus and lung: Secondary | ICD-10-CM | POA: Diagnosis not present

## 2020-04-17 DIAGNOSIS — C3490 Malignant neoplasm of unspecified part of unspecified bronchus or lung: Secondary | ICD-10-CM | POA: Diagnosis not present

## 2020-04-17 NOTE — Progress Notes (Signed)
[facility] [phone]   [phone]  OFFICE PROGRESS NOTE  [name], MD [place] [address]  DIAGNOSIS: Stage IA (T1a, N0, M0) non-small cell lung cancer, well-differentiated adenocarcinoma diagnosed in July 2015.  PRIOR THERAPY: Status post left VATS with left lower lobe wedge resection as well as left basilar segmentectomy and lymph node dissection under the care of Dr. Roxan Hockey on 01/29/2014.  CURRENT THERAPY: Observation.  INTERVAL HISTORY: Amy Dunn 79 y.o. female returns to the clinic today for annual follow-up visit.  The patient is feeling fine today with no concerning complaints except for left shoulder pain.  She denied having any chest pain, shortness of breath, cough or hemoptysis.  She denied having any fever or chills.  She has no nausea, vomiting, diarrhea or constipation.  She denied having any headache or visual changes.  The patient has no significant weight loss or night sweats.  She had repeat CT scan of the chest performed recently and she is here for evaluation and discussion of her scan results.   MEDICAL HISTORY: Past Medical History:  Diagnosis Date  . Acute peptic ulcer, unspecified site, with hemorrhage and perforation, without mention of obstruction   . Cancer of left lung ([facility]) 2015  . Cough   . GERD (gastroesophageal reflux disease)   . Glaucoma    right eye  . History of colonic polyps   . Hypertension   . Osteoarthritis of left shoulder   . Primary osteoarthritis of left hip   . Pulmonary nodule, left    Left Lower lobe  . Stroke [facility])    no residual  . TIA (transient ischemic attack) 2011   transient blindness, speech- resolved    ALLERGIES:  is allergic to penicillins.  MEDICATIONS:  Current Outpatient Medications  Medication Sig Dispense Refill  . amLODipine (NORVASC) 5 MG tablet Take 1 tablet by mouth once daily 90 tablet 0  . Cyanocobalamin (VITAMIN B12  PO) Take 1 tablet by mouth daily.    . diclofenac Sodium (VOLTAREN) 1 % GEL Apply 2 g topically 4 (four) times daily. 100 g 3  . dorzolamide-timolol (COSOPT) 22.3-6.8 MG/ML ophthalmic solution Place 1 drop into the left eye daily.     . fexofenadine (ALLEGRA) 180 MG tablet Take 180 mg by mouth daily as needed. For allergies    . fluticasone (FLONASE) 50 MCG/ACT nasal spray Place 2 sprays into both nostrils daily as needed for allergies or rhinitis.    . hydrochlorothiazide (HYDRODIURIL) 25 MG tablet Take 1 tablet by mouth once daily 90 tablet 0  . losartan (COZAAR) 100 MG tablet Take 1 tablet by mouth once daily 90 tablet 0  . methocarbamol (ROBAXIN) 500 MG tablet Take 1 tablet (500 mg total) by mouth every 8 (eight) hours as needed for muscle spasms. 90 tablet 0  . metoprolol succinate (TOPROL-XL) 50 MG 24 hr tablet TAKE 1 TABLET BY MOUTH DAILY WITH OR IMMEDIATELY FOLLOWING A MEAL 90 tablet 3  . neomycin-polymyxin-hydrocortisone (CORTISPORIN) 3.5-10000-1 OTIC suspension Place 3 drops into both ears 3 (three) times daily. 10 mL 0  . omeprazole (PRILOSEC) 20 MG capsule Take 20 mg by mouth daily.    . promethazine-dextromethorphan (PROMETHAZINE-DM) 6.25-15 MG/5ML syrup Take 5 mLs by mouth 2 (two) times daily as needed for cough. 118 mL 0  . traMADol (ULTRAM) 50 MG tablet TAKE 1 TABLET BY MOUTH EVERY 12 HOURS AS NEEDED 60 tablet 1   No  current facility-administered medications for this visit.    SURGICAL HISTORY:  Past Surgical History:  Procedure Laterality Date  . COLON SURGERY    . EYE SURGERY Bilateral    cataracts  . surgical repair of peptic ulcer    . TUBAL LIGATION    . VIDEO ASSISTED THORACOSCOPY (VATS)/WEDGE RESECTION Left 01/29/2014   Procedure: VIDEO ASSISTED THORACOSCOPY (VATS)/BASILAR SEGMENTECTOMY  LEFT LUNG LOWER LOBE WITH FROZEN SECTION, LEFT LOWER LOBECTOMY;  Surgeon: Melrose Nakayama, MD;  Location: [place];  Service: Thoracic;  Laterality: Left;    REVIEW OF SYSTEMS:  A  comprehensive review of systems was negative except for: Musculoskeletal: positive for arthralgias   PHYSICAL EXAMINATION: General appearance: alert, cooperative and no distress Head: Normocephalic, without obvious abnormality, atraumatic Neck: no adenopathy, no JVD, supple, symmetrical, trachea midline and thyroid not enlarged, symmetric, no tenderness/mass/nodules Lymph nodes: Cervical, supraclavicular, and axillary nodes normal. Resp: clear to auscultation bilaterally Back: symmetric, no curvature. ROM normal. No CVA tenderness. Cardio: regular rate and rhythm, S1, S2 normal, no murmur, click, rub or gallop GI: soft, non-tender; bowel sounds normal; no masses,  no organomegaly Extremities: extremities normal, atraumatic, no cyanosis or edema  ECOG PERFORMANCE STATUS: 1 - Symptomatic but completely ambulatory  Blood pressure 124/72, pulse 76, temperature 97.8 F (36.6 C), resp. rate 18, height 5\' 5"  (1.651 m), weight 119 lb 12.8 oz (54.3 kg), SpO2 99 %.  LABORATORY DATA: Lab Results  Component Value Date   WBC 7.9 04/15/2020   HGB 11.7 (L) 04/15/2020   HCT 33.4 (L) 04/15/2020   MCV 78.6 (L) 04/15/2020   PLT 208 04/15/2020      Chemistry      Component Value Date/Time   NA 140 04/15/2020 0909   NA 139 04/09/2017 0927   K 3.4 (L) 04/15/2020 0909   K 3.7 04/09/2017 0927   CL 101 04/15/2020 0909   CO2 30 04/15/2020 0909   CO2 29 04/09/2017 0927   BUN 21 04/15/2020 0909   BUN 18.7 04/09/2017 0927   CREATININE 1.77 (H) 04/15/2020 0909   CREATININE 1.3 (H) 04/09/2017 0927      Component Value Date/Time   CALCIUM 9.5 04/15/2020 0909   CALCIUM 9.4 04/09/2017 0927   ALKPHOS 90 04/15/2020 0909   ALKPHOS 95 04/09/2017 0927   AST 15 04/15/2020 0909   AST 16 04/09/2017 0927   ALT 10 04/15/2020 0909   ALT 12 04/09/2017 0927   BILITOT 0.7 04/15/2020 0909   BILITOT 0.58 04/09/2017 0927       RADIOGRAPHIC STUDIES: CT Chest Wo Contrast  Result Date: 04/15/2020 CLINICAL  DATA:  Lung cancer. EXAM: CT CHEST WITHOUT CONTRAST TECHNIQUE: Multidetector CT imaging of the chest was performed following the standard protocol without IV contrast. COMPARISON:  04/07/2019. FINDINGS: Cardiovascular: Atherosclerotic calcification of the aorta and coronary arteries. Heart size normal. No pericardial effusion. Mediastinum/Nodes: Mediastinal lymph nodes are not enlarged by CT size criteria. Hilar regions are difficult to evaluate without IV contrast. No axillary adenopathy. Esophagus is dilated which can be seen with dysmotility. Lungs/Pleura: Mild central bronchiectasis. Left lower lobe wedge resection with postoperative scarring, unchanged. Lungs are otherwise clear. No pleural fluid. Airway is unremarkable. Upper Abdomen: Low-attenuation lesions in the liver measure up to 2.1 cm, as before and likely cysts. Visualized portions of the liver, gallbladder and adrenal glands are otherwise unremarkable. Low-attenuation lesions in the kidneys measure up to 2.3 cm on the right and are difficult to further characterize without post-contrast imaging, due to size  and/or incomplete visualization. Visualized portions of the spleen, pancreas, stomach and bowel are grossly unremarkable. Musculoskeletal: Degenerative changes in the spine. No worrisome lytic or sclerotic lesions. IMPRESSION: 1. No evidence of recurrent or metastatic disease. 2. Aortic atherosclerosis (ICD10-I70.0). Coronary artery calcification. Electronically Signed   By: Lorin Picket M.D.   On: 04/15/2020 09:49    ASSESSMENT AND PLAN: This is a very pleasant 79 years old [place] American female recently diagnosed with a stage IA non-small cell lung cancer diagnosed in July 2015 status post left lower lobe wedge resection and currently on observation.  The patient continues to do fine with no concerning complaints. She had repeat CT scan of the chest performed recently.  I personally and independently reviewed the scans and discussed the  results with the patient today. Her scan showed no concerning findings for disease recurrence or metastasis. I recommended for her to continue on observation with repeat CT scan of the chest in 1 year. She was advised to call immediately if she has any concerning symptoms in the interval. The patient voices understanding of current disease status and treatment options and is in agreement with the current care plan.  All questions were answered. The patient knows to call the clinic with any problems, questions or concerns. We can certainly see the patient much sooner if necessary.  Disclaimer: This note was dictated with voice recognition software. Similar sounding words can inadvertently be transcribed and may not be corrected upon review.

## 2020-04-23 ENCOUNTER — Telehealth: Payer: Self-pay | Admitting: Internal Medicine

## 2020-04-23 NOTE — Telephone Encounter (Signed)
Scheduled per los. Called and left msg. Mailed printout  °

## 2020-05-30 ENCOUNTER — Other Ambulatory Visit: Payer: Self-pay | Admitting: Internal Medicine

## 2020-05-30 NOTE — Telephone Encounter (Signed)
Ok to forward to pcp

## 2020-06-03 ENCOUNTER — Telehealth (INDEPENDENT_AMBULATORY_CARE_PROVIDER_SITE_OTHER): Payer: Medicare HMO | Admitting: Family

## 2020-06-03 ENCOUNTER — Other Ambulatory Visit: Payer: Self-pay

## 2020-06-03 DIAGNOSIS — Z20822 Contact with and (suspected) exposure to covid-19: Secondary | ICD-10-CM | POA: Diagnosis not present

## 2020-06-03 DIAGNOSIS — J209 Acute bronchitis, unspecified: Secondary | ICD-10-CM | POA: Diagnosis not present

## 2020-06-03 MED ORDER — HYDROCODONE-HOMATROPINE 5-1.5 MG/5ML PO SYRP
5.0000 mL | ORAL_SOLUTION | Freq: Three times a day (TID) | ORAL | 0 refills | Status: DC | PRN
Start: 2020-06-03 — End: 2020-06-14

## 2020-06-03 MED ORDER — DOXYCYCLINE HYCLATE 100 MG PO TABS: 100.0000 mg | ORAL_TABLET | Freq: Two times a day (BID) | ORAL | 0 refills | Status: DC

## 2020-06-03 NOTE — Progress Notes (Signed)
Amy Dunn is a 80 y.o. female with the following history as recorded in Epic[employer]:  Patient Active Problem List   Diagnosis Date Noted  . Left leg pain 08/07/2019  . Acute pain of left shoulder 04/25/2018  . Postmenopausal bleeding 07/17/2015  . Rib pain 02/01/2015  . Chronic cough 03/24/2013  . Routine general medical examination at a health care facility 03/14/2013  . Benign meningioma of brain ([facility]) 11/03/2012  . Allergic rhinitis 10/10/2012  . TIA (transient ischemic attack) 01/17/2012  . Osteoarthritis 03/06/2010  . Adenocarcinoma of lung, stage 1 ([facility]) 02/17/2010  . Essential hypertension 01/30/2009  . G E REFLUX 01/30/2009    Current Outpatient Medications  Medication Sig Dispense Refill  . doxycycline (VIBRA-TABS) 100 MG tablet Take 1 tablet (100 mg total) by mouth 2 (two) times daily. 20 tablet 0  . HYDROcodone-homatropine (HYCODAN) 5-1.5 MG/5ML syrup Take 5 mLs by mouth every 8 (eight) hours as needed for cough. 120 mL 0  . amLODipine (NORVASC) 5 MG tablet Take 1 tablet by mouth once daily 90 tablet 0  . Cyanocobalamin (VITAMIN B12 PO) Take 1 tablet by mouth daily.    . diclofenac Sodium (VOLTAREN) 1 % GEL Apply 2 g topically 4 (four) times daily. 100 g 3  . dorzolamide-timolol (COSOPT) 22.3-6.8 MG/ML ophthalmic solution Place 1 drop into the left eye daily.     . fexofenadine (ALLEGRA) 180 MG tablet Take 180 mg by mouth daily as needed. For allergies    . fluticasone (FLONASE) 50 MCG/ACT nasal spray Place 2 sprays into both nostrils daily as needed for allergies or rhinitis.    . hydrochlorothiazide (HYDRODIURIL) 25 MG tablet Take 1 tablet by mouth once daily 90 tablet 0  . losartan (COZAAR) 100 MG tablet Take 1 tablet by mouth once daily 90 tablet 0  . metoprolol succinate (TOPROL-XL) 50 MG 24 hr tablet TAKE 1 TABLET BY MOUTH DAILY WITH OR IMMEDIATELY FOLLOWING A MEAL 90 tablet 3  . neomycin-polymyxin-hydrocortisone (CORTISPORIN) 3.5-10000-1 OTIC suspension Place 3 drops  into both ears 3 (three) times daily. 10 mL 0  . omeprazole (PRILOSEC) 20 MG capsule Take 20 mg by mouth daily.    . traMADol (ULTRAM) 50 MG tablet TAKE 1 TABLET BY MOUTH EVERY 12 HOURS AS NEEDED 60 tablet 1   No current facility-administered medications for this visit.    Allergies: Penicillins  Past Medical History:  Diagnosis Date  . Acute peptic ulcer, unspecified site, with hemorrhage and perforation, without mention of obstruction   . Cancer of left lung ([place]) 2015  . Cough   . GERD (gastroesophageal reflux disease)   . Glaucoma    right eye  . History of colonic polyps   . Hypertension   . Osteoarthritis of left shoulder   . Primary osteoarthritis of left hip   . Pulmonary nodule, left    Left Lower lobe  . Stroke [facility])    no residual  . TIA (transient ischemic attack) 2011   transient blindness, speech- resolved    Past Surgical History:  Procedure Laterality Date  . COLON SURGERY    . EYE SURGERY Bilateral    cataracts  . surgical repair of peptic ulcer    . TUBAL LIGATION    . VIDEO ASSISTED THORACOSCOPY (VATS)/WEDGE RESECTION Left 01/29/2014   Procedure: VIDEO ASSISTED THORACOSCOPY (VATS)/BASILAR SEGMENTECTOMY  LEFT LUNG LOWER LOBE WITH FROZEN SECTION, LEFT LOWER LOBECTOMY;  Surgeon: Melrose Nakayama, MD;  Location: [facility];  Service: Thoracic;  Laterality: Left;  Family History  Problem Relation Age of Onset  . Hypertension Mother   . Heart disease Mother   . Heart attack Mother   . Cancer Father        Colon cancer    Social History   Tobacco Use  . Smoking status: Former Smoker    Years: 1.00    Types: Cigarettes    Quit date: 05/26/1981    Years since quitting: 39.0  . Smokeless tobacco: Never Used  . Tobacco comment: 2 ciggs per day  Substance Use Topics  . Alcohol use: No    Subjective:   I connected with [name] on 06/03/20 at  9:40 AM EST by a telephone call and verified that I am speaking with the correct person using two  identifiers.   I discussed the limitations of evaluation and management by telemedicine and the availability of in person appointments. The patient expressed understanding and agreed to proceed. Provider in office/ patient is at home; provider and patient are only 2 people on telephone call.   Patient was exposed to [place] on 05/27/20; she notes that her symptoms started on 05/28/20; she is fully vaccinated including booster; complaining of persisting headache, cough/ chest congestion; denies any shortness of breath but does feel that the cough is moving into her chest; using OTC cough syrup with little relief; no fever; is scheduled for a COVID test later today at 12:00;    Objective:  There were no vitals filed for this visit.  Lungs: Respirations unlabored;  Neurologic: Alert and oriented; speech intact;   Assessment:  1. Acute bronchitis, unspecified organism     Plan:  Encouraged to get COVID test done as scheduled; will go ahead and treat with Doxycycline and Hycodan cough syrup; will need to consider CXR if symptoms persist and pending results of COVID test;  Time spent 12 minutes  No follow-ups on file.  No orders of the defined types were placed in this encounter.   Requested Prescriptions   Signed Prescriptions Disp Refills  . doxycycline (VIBRA-TABS) 100 MG tablet 20 tablet 0    Sig: Take 1 tablet (100 mg total) by mouth 2 (two) times daily.  Marland Kitchen HYDROcodone-homatropine (HYCODAN) 5-1.5 MG/5ML syrup 120 mL 0    Sig: Take 5 mLs by mouth every 8 (eight) hours as needed for cough.

## 2020-06-04 NOTE — Telephone Encounter (Signed)
Can you fill this thanks

## 2020-06-14 ENCOUNTER — Encounter: Payer: Self-pay | Admitting: Internal Medicine

## 2020-06-14 ENCOUNTER — Telehealth (INDEPENDENT_AMBULATORY_CARE_PROVIDER_SITE_OTHER): Payer: Medicare HMO | Admitting: Internal Medicine

## 2020-06-14 DIAGNOSIS — U071 COVID-19: Secondary | ICD-10-CM | POA: Diagnosis not present

## 2020-06-14 MED ORDER — PREDNISONE 20 MG PO TABS: 40.0000 mg | ORAL_TABLET | Freq: Every day | ORAL | 0 refills | Status: DC

## 2020-06-14 MED ORDER — PROMETHAZINE-DM 6.25-15 MG/5ML PO SYRP: 5.0000 mL | ORAL_SOLUTION | Freq: Four times a day (QID) | ORAL | 3 refills | Status: DC | PRN

## 2020-06-14 NOTE — Assessment & Plan Note (Addendum)
Rx prednisone, finish doxycycline course. Does not need another test, she is outside the quarantine period and persistent positive test does not change that. Rx promethazine/dm cough medicine.

## 2020-06-14 NOTE — Progress Notes (Signed)
Virtual Visit via Audio Note  I connected with Amy Dunn on 06/14/20 at  1:20 PM EST by an audio-only enabled telemedicine application and verified that I am speaking with the correct person using two identifiers.  The patient and the provider were at separate locations throughout the entire encounter. Patient location: home, Provider location: work   I discussed the limitations of evaluation and management by telemedicine and the availability of in person appointments. The patient expressed understanding and agreed to proceed. The patient and the provider were the only parties present for the visit unless noted in HPI below.  History of Present Illness: The patient is a 80 y.o. female with visit for covid-19. Started 2 weeks ago. Has some cough still and soreness in the muscles from coughing. Mild SOB which is slightly worse than baseline. Denies fevers or chills. Overall it is mild improvement. She is concerned because she has had another test and it was still positive 2 weeks after getting diagnosed and she wants to know if she needs another test. Has tried hycodan cough medicine which did not work as well as promethazine/dm cough syrup. She is still taking doxycycline and has a few more days left.   Observations/Objective: A and O times 3, voice strong, minimal coughing during visit  Assessment and Plan: See problem oriented charting  Follow Up Instructions: rx prednisone and promethazine/dm cough syrup  Visit time 12 minutes in non-face to face communication with patient and coordination of care.  I discussed the assessment and treatment plan with the patient. The patient was provided an opportunity to ask questions and all were answered. The patient agreed with the plan and demonstrated an understanding of the instructions.   The patient was advised to call back or seek an in-person evaluation if the symptoms worsen or if the condition fails to improve as anticipated.  [name], MD

## 2020-07-16 ENCOUNTER — Encounter (INDEPENDENT_AMBULATORY_CARE_PROVIDER_SITE_OTHER): Payer: Medicare HMO | Admitting: Ophthalmology

## 2020-07-26 ENCOUNTER — Other Ambulatory Visit: Payer: Self-pay | Admitting: Family

## 2020-08-22 ENCOUNTER — Encounter (INDEPENDENT_AMBULATORY_CARE_PROVIDER_SITE_OTHER): Payer: Medicare HMO | Admitting: Ophthalmology

## 2020-08-23 ENCOUNTER — Encounter (INDEPENDENT_AMBULATORY_CARE_PROVIDER_SITE_OTHER): Payer: Medicare HMO | Admitting: Ophthalmology

## 2020-08-30 ENCOUNTER — Other Ambulatory Visit: Payer: Self-pay

## 2020-08-30 ENCOUNTER — Encounter (INDEPENDENT_AMBULATORY_CARE_PROVIDER_SITE_OTHER): Payer: Medicare HMO | Admitting: Ophthalmology

## 2020-08-30 DIAGNOSIS — H43813 Vitreous degeneration, bilateral: Secondary | ICD-10-CM | POA: Diagnosis not present

## 2020-08-30 DIAGNOSIS — H35033 Hypertensive retinopathy, bilateral: Secondary | ICD-10-CM | POA: Diagnosis not present

## 2020-08-30 DIAGNOSIS — H348312 Tributary (branch) retinal vein occlusion, right eye, stable: Secondary | ICD-10-CM

## 2020-08-30 DIAGNOSIS — I10 Essential (primary) hypertension: Secondary | ICD-10-CM | POA: Diagnosis not present

## 2020-09-12 ENCOUNTER — Other Ambulatory Visit: Payer: Self-pay | Admitting: Internal Medicine

## 2020-09-15 ENCOUNTER — Other Ambulatory Visit: Payer: Self-pay | Admitting: Internal Medicine

## 2020-10-09 ENCOUNTER — Ambulatory Visit (INDEPENDENT_AMBULATORY_CARE_PROVIDER_SITE_OTHER): Payer: Medicare HMO | Admitting: Internal Medicine

## 2020-10-09 ENCOUNTER — Encounter: Payer: Self-pay | Admitting: Internal Medicine

## 2020-10-09 ENCOUNTER — Other Ambulatory Visit: Payer: Self-pay

## 2020-10-09 VITALS — BP 130/84 | HR 73 | Temp 98.4°F | Ht 65.0 in | Wt 122.0 lb

## 2020-10-09 DIAGNOSIS — I10 Essential (primary) hypertension: Secondary | ICD-10-CM | POA: Diagnosis not present

## 2020-10-09 DIAGNOSIS — Z0000 Encounter for general adult medical examination without abnormal findings: Secondary | ICD-10-CM | POA: Diagnosis not present

## 2020-10-09 DIAGNOSIS — M159 Polyosteoarthritis, unspecified: Secondary | ICD-10-CM

## 2020-10-09 DIAGNOSIS — M8949 Other hypertrophic osteoarthropathy, multiple sites: Secondary | ICD-10-CM | POA: Diagnosis not present

## 2020-10-09 MED ORDER — HYDROCHLOROTHIAZIDE 25 MG PO TABS: 1.0000 | ORAL_TABLET | Freq: Every day | ORAL | 3 refills | Status: DC

## 2020-10-09 MED ORDER — LOSARTAN POTASSIUM 100 MG PO TABS: 1.0000 | ORAL_TABLET | Freq: Every day | ORAL | 3 refills | Status: DC

## 2020-10-09 MED ORDER — AMLODIPINE BESYLATE 5 MG PO TABS: 1.0000 | ORAL_TABLET | Freq: Every day | ORAL | 3 refills | Status: DC

## 2020-10-09 NOTE — Patient Instructions (Signed)
Health Maintenance, Female Adopting a healthy lifestyle and getting preventive care are important in promoting health and wellness. Ask your health care provider about:  The right schedule for you to have regular tests and exams.  Things you can do on your own to prevent diseases and keep yourself healthy. What should I know about diet, weight, and exercise? Eat a healthy diet  Eat a diet that includes plenty of vegetables, fruits, low-fat dairy products, and lean protein.  Do not eat a lot of foods that are high in solid fats, added sugars, or sodium.   Maintain a healthy weight Body mass index (BMI) is used to identify weight problems. It estimates body fat based on height and weight. Your health care provider can help determine your BMI and help you achieve or maintain a healthy weight. Get regular exercise Get regular exercise. This is one of the most important things you can do for your health. Most adults should:  Exercise for at least 150 minutes each week. The exercise should increase your heart rate and make you sweat (moderate-intensity exercise).  Do strengthening exercises at least twice a week. This is in addition to the moderate-intensity exercise.  Spend less time sitting. Even light physical activity can be beneficial. Watch cholesterol and blood lipids Have your blood tested for lipids and cholesterol at 82 years of age, then have this test every 5 years. Have your cholesterol levels checked more often if:  Your lipid or cholesterol levels are high.  You are older than 82 years of age.  You are at high risk for heart disease. What should I know about cancer screening? Depending on your health history and family history, you may need to have cancer screening at various ages. This may include screening for:  Breast cancer.  Cervical cancer.  Colorectal cancer.  Skin cancer.  Lung cancer. What should I know about heart disease, diabetes, and high blood  pressure? Blood pressure and heart disease  High blood pressure causes heart disease and increases the risk of stroke. This is more likely to develop in people who have high blood pressure readings, are of African descent, or are overweight.  Have your blood pressure checked: ? Every 3-5 years if you are 18-39 years of age. ? Every year if you are 40 years old or older. Diabetes Have regular diabetes screenings. This checks your fasting blood sugar level. Have the screening done:  Once every three years after age 40 if you are at a normal weight and have a low risk for diabetes.  More often and at a younger age if you are overweight or have a high risk for diabetes. What should I know about preventing infection? Hepatitis B If you have a higher risk for hepatitis B, you should be screened for this virus. Talk with your health care provider to find out if you are at risk for hepatitis B infection. Hepatitis C Testing is recommended for:  Everyone born from 1945 through 1965.  Anyone with known risk factors for hepatitis C. Sexually transmitted infections (STIs)  Get screened for STIs, including gonorrhea and chlamydia, if: ? You are sexually active and are younger than 82 years of age. ? You are older than 82 years of age and your health care provider tells you that you are at risk for this type of infection. ? Your sexual activity has changed since you were last screened, and you are at increased risk for chlamydia or gonorrhea. Ask your health care provider   if you are at risk.  Ask your health care provider about whether you are at high risk for HIV. Your health care provider may recommend a prescription medicine to help prevent HIV infection. If you choose to take medicine to prevent HIV, you should first get tested for HIV. You should then be tested every 3 months for as long as you are taking the medicine. Pregnancy  If you are about to stop having your period (premenopausal) and  you may become pregnant, seek counseling before you get pregnant.  Take 400 to 800 micrograms (mcg) of folic acid every day if you become pregnant.  Ask for birth control (contraception) if you want to prevent pregnancy. Osteoporosis and menopause Osteoporosis is a disease in which the bones lose minerals and strength with aging. This can result in bone fractures. If you are 65 years old or older, or if you are at risk for osteoporosis and fractures, ask your health care provider if you should:  Be screened for bone loss.  Take a calcium or vitamin D supplement to lower your risk of fractures.  Be given hormone replacement therapy (HRT) to treat symptoms of menopause. Follow these instructions at home: Lifestyle  Do not use any products that contain nicotine or tobacco, such as cigarettes, e-cigarettes, and chewing tobacco. If you need help quitting, ask your health care provider.  Do not use street drugs.  Do not share needles.  Ask your health care provider for help if you need support or information about quitting drugs. Alcohol use  Do not drink alcohol if: ? Your health care provider tells you not to drink. ? You are pregnant, may be pregnant, or are planning to become pregnant.  If you drink alcohol: ? Limit how much you use to 0-1 drink a day. ? Limit intake if you are breastfeeding.  Be aware of how much alcohol is in your drink. In the [place] one drink equals one 12 oz bottle of beer (355 mL), one 5 oz glass of wine (148 mL), or one 1 oz glass of hard liquor (44 mL). General instructions  Schedule regular health, dental, and eye exams.  Stay current with your vaccines.  Tell your health care provider if: ? You often feel depressed. ? You have ever been abused or do not feel safe at home. Summary  Adopting a healthy lifestyle and getting preventive care are important in promoting health and wellness.  Follow your health care provider's instructions about healthy  diet, exercising, and getting tested or screened for diseases.  Follow your health care provider's instructions on monitoring your cholesterol and blood pressure. This information is not intended to replace advice given to you by your health care provider. Make sure you discuss any questions you have with your health care provider. Document Revised: 05/11/2018 Document Reviewed: 05/11/2018 Elsevier Patient Education  2021 [employer]  

## 2020-10-09 NOTE — Progress Notes (Signed)
   Subjective:   Patient ID: Amy Dunn, female    DOB: 08/03/1940, 80 y.o.   MRN: [id number]  HPI The patient is an 80 YO female coming in for physical.   PMH, [place], social history reviewed and updated  Review of Systems  Constitutional: Negative.   HENT: Negative.   Eyes: Negative.   Respiratory: Negative for cough, chest tightness and shortness of breath.   Cardiovascular: Negative for chest pain, palpitations and leg swelling.  Gastrointestinal: Negative for abdominal distention, abdominal pain, constipation, diarrhea, nausea and vomiting.  Musculoskeletal: Positive for arthralgias.  Skin: Negative.   Neurological: Negative.   Psychiatric/Behavioral: Negative.     Objective:  Physical Exam Constitutional:      Appearance: She is well-developed.  HENT:     Head: Normocephalic and atraumatic.  Cardiovascular:     Rate and Rhythm: Normal rate and regular rhythm.  Pulmonary:     Effort: Pulmonary effort is normal. No respiratory distress.     Breath sounds: Normal breath sounds. No wheezing or rales.  Abdominal:     General: Bowel sounds are normal. There is no distension.     Palpations: Abdomen is soft.     Tenderness: There is no abdominal tenderness. There is no rebound.  Musculoskeletal:     Cervical back: Normal range of motion.  Skin:    General: Skin is warm and dry.  Neurological:     Mental Status: She is alert and oriented to person, place, and time.     Coordination: Coordination normal.     Vitals:   10/09/20 0947  BP: 130/84  Pulse: 73  Temp: 98.4 F (36.9 C)  TempSrc: Oral  SpO2: 97%  Weight: 122 lb (55.3 kg)  Height: 5\' 5"  (1.651 m)    This visit occurred during the SARS-CoV-2 public health emergency.  Safety protocols were in place, including screening questions prior to the visit, additional usage of staff PPE, and extensive cleaning of exam room while observing appropriate contact time as indicated for disinfecting solutions.    Assessment & Plan:

## 2020-10-11 ENCOUNTER — Encounter: Payer: Self-pay | Admitting: Internal Medicine

## 2020-10-11 NOTE — Assessment & Plan Note (Signed)
BP at goal on amlodipine, losartan, hctz, metoprolol. Checking CMP and adjust as needed.

## 2020-10-11 NOTE — Assessment & Plan Note (Signed)
Flu shot yearly. Covid-19 counseled about booster. Pneumonia complete. Shingrix counseled to get at pharmacy. Tetanus due declines. Colonoscopy aged out. Mammogram aged out, pap smear aged out and dexa declines further. Counseled about sun safety and mole surveillance. Counseled about the dangers of distracted driving. Given 10 year screening recommendations.

## 2020-10-11 NOTE — Assessment & Plan Note (Signed)
Stable overall uses tramadol for pain from this and prior lung surgery.

## 2020-12-30 ENCOUNTER — Other Ambulatory Visit: Payer: Self-pay | Admitting: Internal Medicine

## 2021-04-17 ENCOUNTER — Other Ambulatory Visit: Payer: Self-pay

## 2021-04-17 ENCOUNTER — Ambulatory Visit (HOSPITAL_COMMUNITY)
Admission: RE | Admit: 2021-04-17 | Discharge: 2021-04-17 | Disposition: A | Discharge status: Home / Self Care | Payer: Medicare HMO | Source: Ambulatory Visit | Attending: Internal Medicine | Admitting: Internal Medicine

## 2021-04-17 ENCOUNTER — Encounter (HOSPITAL_COMMUNITY): Payer: Self-pay

## 2021-04-17 DIAGNOSIS — C3490 Malignant neoplasm of unspecified part of unspecified bronchus or lung: Secondary | ICD-10-CM | POA: Insufficient documentation

## 2021-04-17 DIAGNOSIS — I700 Atherosclerosis of aorta: Secondary | ICD-10-CM | POA: Diagnosis not present

## 2021-04-18 ENCOUNTER — Inpatient Hospital Stay: Payer: Medicare HMO | Attending: Internal Medicine

## 2021-04-18 ENCOUNTER — Other Ambulatory Visit: Payer: Self-pay

## 2021-04-18 DIAGNOSIS — Z902 Acquired absence of lung [part of]: Secondary | ICD-10-CM | POA: Diagnosis not present

## 2021-04-18 DIAGNOSIS — Z79899 Other long term (current) drug therapy: Secondary | ICD-10-CM | POA: Diagnosis not present

## 2021-04-18 DIAGNOSIS — C3490 Malignant neoplasm of unspecified part of unspecified bronchus or lung: Secondary | ICD-10-CM

## 2021-04-18 DIAGNOSIS — Z85118 Personal history of other malignant neoplasm of bronchus and lung: Secondary | ICD-10-CM | POA: Insufficient documentation

## 2021-04-18 DIAGNOSIS — I10 Essential (primary) hypertension: Secondary | ICD-10-CM | POA: Diagnosis not present

## 2021-04-18 DIAGNOSIS — Z8673 Personal history of transient ischemic attack (TIA), and cerebral infarction without residual deficits: Secondary | ICD-10-CM | POA: Insufficient documentation

## 2021-04-18 LAB — CBC WITH DIFFERENTIAL (CANCER CENTER ONLY)
Abs Immature Granulocytes: 0.02 10*3/uL (ref 0.00–0.07)
Basophils Absolute: 0.1 10*3/uL (ref 0.0–0.1)
Basophils Relative: 1 %
Eosinophils Absolute: 0.1 10*3/uL (ref 0.0–0.5)
Eosinophils Relative: 1 %
HCT: 30.7 % — ABNORMAL LOW (ref 36.0–46.0)
Hemoglobin: 11 g/dL — ABNORMAL LOW (ref 12.0–15.0)
Immature Granulocytes: 0 %
Lymphocytes Relative: 28 %
Lymphs Abs: 2 10*3/uL (ref 0.7–4.0)
MCH: 27.6 pg (ref 26.0–34.0)
MCHC: 35.8 g/dL (ref 30.0–36.0)
MCV: 76.9 fL — ABNORMAL LOW (ref 80.0–100.0)
Monocytes Absolute: 0.6 10*3/uL (ref 0.1–1.0)
Monocytes Relative: 9 %
Neutro Abs: 4.3 10*3/uL (ref 1.7–7.7)
Neutrophils Relative %: 61 %
Platelet Count: 218 10*3/uL (ref 150–400)
RBC: 3.99 MIL/uL (ref 3.87–5.11)
RDW: 14 % (ref 11.5–15.5)
WBC Count: 7.1 10*3/uL (ref 4.0–10.5)
nRBC: 0 % (ref 0.0–0.2)

## 2021-04-18 LAB — CMP (CANCER CENTER ONLY)
ALT: 12 U/L (ref 0–44)
AST: 19 U/L (ref 15–41)
Albumin: 3.9 g/dL (ref 3.5–5.0)
Alkaline Phosphatase: 94 U/L (ref 38–126)
Anion gap: 9 (ref 5–15)
BUN: 18 mg/dL (ref 8–23)
CO2: 28 mmol/L (ref 22–32)
Calcium: 9.4 mg/dL (ref 8.9–10.3)
Chloride: 103 mmol/L (ref 98–111)
Creatinine: 1.44 mg/dL — ABNORMAL HIGH (ref 0.44–1.00)
GFR, Estimated: 36 mL/min — ABNORMAL LOW (ref >60)
Glucose, Bld: 112 mg/dL — ABNORMAL HIGH (ref 70–99)
Potassium: 3.9 mmol/L (ref 3.5–5.1)
Sodium: 140 mmol/L (ref 135–145)
Total Bilirubin: 0.4 mg/dL (ref 0.3–1.2)
Total Protein: 7.6 g/dL (ref 6.5–8.1)

## 2021-04-21 ENCOUNTER — Inpatient Hospital Stay: Payer: Medicare HMO | Admitting: Internal Medicine

## 2021-04-21 ENCOUNTER — Encounter: Payer: Self-pay | Admitting: Internal Medicine

## 2021-04-21 ENCOUNTER — Other Ambulatory Visit: Payer: Self-pay

## 2021-04-21 VITALS — BP 135/81 | Temp 97.3°F | Resp 18 | Wt 120.2 lb

## 2021-04-21 DIAGNOSIS — C3492 Malignant neoplasm of unspecified part of left bronchus or lung: Secondary | ICD-10-CM

## 2021-04-21 DIAGNOSIS — I10 Essential (primary) hypertension: Secondary | ICD-10-CM | POA: Diagnosis not present

## 2021-04-21 DIAGNOSIS — Z85118 Personal history of other malignant neoplasm of bronchus and lung: Secondary | ICD-10-CM | POA: Diagnosis not present

## 2021-04-21 DIAGNOSIS — Z902 Acquired absence of lung [part of]: Secondary | ICD-10-CM | POA: Diagnosis not present

## 2021-04-21 DIAGNOSIS — Z8673 Personal history of transient ischemic attack (TIA), and cerebral infarction without residual deficits: Secondary | ICD-10-CM | POA: Diagnosis not present

## 2021-04-21 DIAGNOSIS — Z79899 Other long term (current) drug therapy: Secondary | ICD-10-CM | POA: Diagnosis not present

## 2021-04-21 NOTE — Progress Notes (Signed)
[facility] Telephone:([phone]   [phone]  OFFICE PROGRESS NOTE  [name], MD [place] [address]  DIAGNOSIS: Stage IA (T1a, N0, M0) non-small cell lung cancer, well-differentiated adenocarcinoma diagnosed in July 2015.  PRIOR THERAPY: Status post left VATS with left lower lobe wedge resection as well as left basilar segmentectomy and lymph node dissection under the care of Dr. Roxan Hockey on 01/29/2014.  CURRENT THERAPY: Observation.  INTERVAL HISTORY: Amy Dunn 80 y.o. female returns to the clinic today for annual follow-up visit.  The patient is feeling fine today with no concerning complaints except for occasional pain on the left side of the chest from the surgical scar.  She denied having any current shortness of breath, cough or hemoptysis.  She denied having any fever or chills.  She has no nausea, vomiting, diarrhea or constipation.  She has no headache or visual changes.  She denied having any weight loss or night sweats.  The patient is here today for evaluation with repeat CT scan of the chest for restaging of her disease.  MEDICAL HISTORY: Past Medical History:  Diagnosis Date   Acute peptic ulcer, unspecified site, with hemorrhage and perforation, without mention of obstruction    Cancer of left lung ([facility]) 2015   Cough    GERD (gastroesophageal reflux disease)    Glaucoma    right eye   History of colonic polyps    Hypertension    Osteoarthritis of left shoulder    Primary osteoarthritis of left hip    Pulmonary nodule, left    Left Lower lobe   Stroke ([facility])    no residual   TIA (transient ischemic attack) 2011   transient blindness, speech- resolved    ALLERGIES:  is allergic to penicillins.  MEDICATIONS:  Current Outpatient Medications  Medication Sig Dispense Refill   traMADol (ULTRAM) 50 MG tablet TAKE 1 TABLET BY MOUTH EVERY 12 HOURS AS NEEDED 60 tablet 5   amLODipine (NORVASC) 5 MG  tablet Take 1 tablet (5 mg total) by mouth daily. 90 tablet 3   Cyanocobalamin (VITAMIN B12 PO) Take 1 tablet by mouth daily.     diclofenac Sodium (VOLTAREN) 1 % GEL Apply 2 g topically 4 (four) times daily. 100 g 3   fexofenadine (ALLEGRA) 180 MG tablet Take 180 mg by mouth daily as needed. For allergies     fluticasone (FLONASE) 50 MCG/ACT nasal spray Place 2 sprays into both nostrils daily as needed for allergies or rhinitis.     hydrochlorothiazide (HYDRODIURIL) 25 MG tablet Take 1 tablet (25 mg total) by mouth daily. 90 tablet 3   losartan (COZAAR) 100 MG tablet Take 1 tablet (100 mg total) by mouth daily. 90 tablet 3   metoprolol succinate (TOPROL-XL) 50 MG 24 hr tablet TAKE 1 TABLET BY MOUTH DAILY WITH OR IMMEDIATELY FOLLOWING A MEAL (Patient not taking: Reported on 10/09/2020) 90 tablet 3   neomycin-polymyxin-hydrocortisone (CORTISPORIN) 3.5-10000-1 OTIC suspension Place 3 drops into both ears 3 (three) times daily. 10 mL 0   omeprazole (PRILOSEC) 20 MG capsule Take 20 mg by mouth daily.     No current facility-administered medications for this visit.    SURGICAL HISTORY:  Past Surgical History:  Procedure Laterality Date   COLON SURGERY     EYE SURGERY Bilateral    cataracts   surgical repair of peptic ulcer     TUBAL LIGATION     VIDEO ASSISTED THORACOSCOPY (  VATS)/WEDGE RESECTION Left 01/29/2014   Procedure: VIDEO ASSISTED THORACOSCOPY (VATS)/BASILAR SEGMENTECTOMY  LEFT LUNG LOWER LOBE WITH FROZEN SECTION, LEFT LOWER LOBECTOMY;  Surgeon: Melrose Nakayama, MD;  Location: [facility];  Service: Thoracic;  Laterality: Left;    REVIEW OF SYSTEMS:  A comprehensive review of systems was negative except for: Musculoskeletal: positive for arthralgias   PHYSICAL EXAMINATION: General appearance: alert, cooperative and no distress Head: Normocephalic, without obvious abnormality, atraumatic Neck: no adenopathy, no JVD, supple, symmetrical, trachea midline and thyroid not enlarged,  symmetric, no tenderness/mass/nodules Lymph nodes: Cervical, supraclavicular, and axillary nodes normal. Resp: clear to auscultation bilaterally Back: symmetric, no curvature. ROM normal. No CVA tenderness. Cardio: regular rate and rhythm, S1, S2 normal, no murmur, click, rub or gallop GI: soft, non-tender; bowel sounds normal; no masses,  no organomegaly Extremities: extremities normal, atraumatic, no cyanosis or edema  ECOG PERFORMANCE STATUS: 1 - Symptomatic but completely ambulatory  Blood pressure 135/81, temperature (!) 97.3 F (36.3 C), resp. rate 18, weight 120 lb 3.2 oz (54.5 kg), SpO2 98 %.  LABORATORY DATA: Lab Results  Component Value Date   WBC 7.1 04/18/2021   HGB 11.0 (L) 04/18/2021   HCT 30.7 (L) 04/18/2021   MCV 76.9 (L) 04/18/2021   PLT 218 04/18/2021      Chemistry      Component Value Date/Time   NA 140 04/18/2021 1111   NA 139 04/09/2017 0927   K 3.9 04/18/2021 1111   K 3.7 04/09/2017 0927   CL 103 04/18/2021 1111   CO2 28 04/18/2021 1111   CO2 29 04/09/2017 0927   BUN 18 04/18/2021 1111   BUN 18.7 04/09/2017 0927   CREATININE 1.44 (H) 04/18/2021 1111   CREATININE 1.3 (H) 04/09/2017 0927      Component Value Date/Time   CALCIUM 9.4 04/18/2021 1111   CALCIUM 9.4 04/09/2017 0927   ALKPHOS 94 04/18/2021 1111   ALKPHOS 95 04/09/2017 0927   AST 19 04/18/2021 1111   AST 16 04/09/2017 0927   ALT 12 04/18/2021 1111   ALT 12 04/09/2017 0927   BILITOT 0.4 04/18/2021 1111   BILITOT 0.58 04/09/2017 0927       RADIOGRAPHIC STUDIES: CT Chest Wo Contrast  Result Date: 04/18/2021 CLINICAL DATA:  Lung cancer surveillance EXAM: CT CHEST WITHOUT CONTRAST TECHNIQUE: Multidetector CT imaging of the chest was performed following the standard protocol without IV contrast. COMPARISON:  04/15/2020 FINDINGS: Cardiovascular: Aortic atherosclerosis. Normal heart size. No pericardial effusion. Mediastinum/Nodes: No enlarged mediastinal, hilar, or axillary lymph nodes.  Thyroid gland, trachea, and esophagus demonstrate no significant findings. Lungs/Pleura: Status post left upper and left lower lobe wedge resections. No pleural effusion or pneumothorax. Upper Abdomen: No acute abnormality. Numerous bilateral low-attenuation renal lesions, incompletely characterized although unchanged. Musculoskeletal: No chest wall mass or suspicious bone lesions identified. IMPRESSION: Status post left lung wedge resections. No evidence of recurrent or metastatic disease in the chest. Aortic Atherosclerosis (ICD10-I70.0). Electronically Signed   By: Delanna Ahmadi M.D.   On: 04/18/2021 08:46     ASSESSMENT AND PLAN: This is a very pleasant 80 years old [place] American female recently diagnosed with a stage IA non-small cell lung cancer diagnosed in July 2015 status post left lower lobe wedge resection and currently on observation.  The patient has been doing fine with no concerning complaints. She had repeat CT scan of the chest performed recently.  I personally and independently reviewed the scans and discussed the results with the patient today. Her scan showed  no concerning findings for disease recurrence or metastasis. I recommended her to continue on observation with routine follow-up visit by her primary care physician from now on. I will see the patient on as-needed basis at this point. She was advised to call if she has any concerning symptoms. The patient voices understanding of current disease status and treatment options and is in agreement with the current care plan.  All questions were answered. The patient knows to call the clinic with any problems, questions or concerns. We can certainly see the patient much sooner if necessary.  Disclaimer: This note was dictated with voice recognition software. Similar sounding words can inadvertently be transcribed and may not be corrected upon review.

## 2021-06-03 ENCOUNTER — Telehealth: Payer: Self-pay | Admitting: Internal Medicine

## 2021-06-03 NOTE — Telephone Encounter (Signed)
Patient calling in  Patient says she has been experiencing cold like symptoms for about a week.. mainly a lot of congestion in chest & it is hard to get out   Offered 1st available ov.. patient declined says she cant wait that long & requested call back   Please call [phone]

## 2021-06-04 ENCOUNTER — Ambulatory Visit: Payer: Medicare HMO | Admitting: Internal Medicine

## 2021-06-04 DIAGNOSIS — J069 Acute upper respiratory infection, unspecified: Secondary | ICD-10-CM | POA: Diagnosis not present

## 2021-06-04 NOTE — Telephone Encounter (Signed)
Called pt at [phone]. Unable to get in contact with the pt due to the phone constantly ringing.

## 2021-06-04 NOTE — Telephone Encounter (Signed)
Pt husband called in requesting to be seen. I advised the him the first available appt and he declines stating that she can't wait that long. I gave him the option that urgent care maybe the best option if its urgent and cant wait.  Pt husband is requesting that the nurse or Dr. Sharlet Salina call them back.

## 2021-06-11 ENCOUNTER — Ambulatory Visit: Payer: Medicare HMO | Admitting: Internal Medicine

## 2021-06-17 ENCOUNTER — Ambulatory Visit: Payer: Medicare HMO | Admitting: Internal Medicine

## 2021-06-17 ENCOUNTER — Other Ambulatory Visit: Payer: Self-pay | Admitting: Internal Medicine

## 2021-06-23 ENCOUNTER — Encounter: Payer: Self-pay | Admitting: Internal Medicine

## 2021-06-23 ENCOUNTER — Ambulatory Visit (INDEPENDENT_AMBULATORY_CARE_PROVIDER_SITE_OTHER): Payer: Medicare HMO | Admitting: Internal Medicine

## 2021-06-23 ENCOUNTER — Other Ambulatory Visit: Payer: Self-pay

## 2021-06-23 DIAGNOSIS — R058 Other specified cough: Secondary | ICD-10-CM | POA: Insufficient documentation

## 2021-06-23 MED ORDER — PROMETHAZINE-DM 6.25-15 MG/5ML PO SYRP: 5.0000 mL | ORAL_SOLUTION | Freq: Four times a day (QID) | ORAL | 0 refills | Status: DC | PRN

## 2021-06-23 NOTE — Patient Instructions (Signed)
We have sent in the cough medicine to use for the cough.

## 2021-06-23 NOTE — Progress Notes (Signed)
° °  Subjective:   Patient ID: Amy Dunn, female    DOB: September 12, 1940, 81 y.o.   MRN: [id number]  Cough Pertinent negatives include no chest pain or shortness of breath.  The patient is an 81 YO female coming in for persistent post-viral cough. Took antibiotics from urgent care finished about 1 week ago. 2-3 weeks of symptoms overall.   Review of Systems  Constitutional: Negative.   HENT: Negative.    Eyes: Negative.   Respiratory:  Positive for cough. Negative for chest tightness and shortness of breath.   Cardiovascular:  Negative for chest pain, palpitations and leg swelling.  Gastrointestinal:  Negative for abdominal distention, abdominal pain, constipation, diarrhea, nausea and vomiting.  Musculoskeletal: Negative.   Skin: Negative.   Neurological: Negative.   Psychiatric/Behavioral: Negative.     Objective:  Physical Exam Constitutional:      Appearance: She is well-developed.  HENT:     Head: Normocephalic and atraumatic.  Cardiovascular:     Rate and Rhythm: Normal rate and regular rhythm.  Pulmonary:     Effort: Pulmonary effort is normal. No respiratory distress.     Breath sounds: Normal breath sounds. No wheezing or rales.  Abdominal:     General: Bowel sounds are normal. There is no distension.     Palpations: Abdomen is soft.     Tenderness: There is no abdominal tenderness. There is no rebound.  Musculoskeletal:     Cervical back: Normal range of motion.  Skin:    General: Skin is warm and dry.  Neurological:     Mental Status: She is alert and oriented to person, place, and time.     Coordination: Coordination normal.    Vitals:   06/23/21 1338  BP: 122/70  Pulse: 97  Resp: 18  SpO2: 98%  Weight: 120 lb (54.4 kg)  Height: 5\' 5"  (1.651 m)    This visit occurred during the SARS-CoV-2 public health emergency.  Safety protocols were in place, including screening questions prior to the visit, additional usage of staff PPE, and extensive cleaning of exam  room while observing appropriate contact time as indicated for disinfecting solutions.   Assessment & Plan:

## 2021-06-23 NOTE — Assessment & Plan Note (Signed)
Rx promethazine/dm cough syrup which has worked well for her in the past. Advised lungs clear on exam this is likely post viral and could last another few weeks.

## 2021-07-04 ENCOUNTER — Ambulatory Visit: Payer: Medicare HMO

## 2021-07-25 ENCOUNTER — Ambulatory Visit (INDEPENDENT_AMBULATORY_CARE_PROVIDER_SITE_OTHER): Payer: Medicare HMO

## 2021-07-25 DIAGNOSIS — Z0000 Encounter for general adult medical examination without abnormal findings: Secondary | ICD-10-CM | POA: Diagnosis not present

## 2021-07-25 NOTE — Patient Instructions (Signed)
Amy Dunn , Thank you for taking time to come for your Medicare Wellness Visit. I appreciate your ongoing commitment to your health goals. Please review the following plan we discussed and let me know if I can assist you in the future.   Screening recommendations/referrals: Colonoscopy: no longer required  Mammogram: no longer required  Bone Density: 06/15/2014 Recommended yearly ophthalmology/optometry visit for glaucoma screening and checkup Recommended yearly dental visit for hygiene and checkup  Vaccinations: Influenza vaccine: completed  Pneumococcal vaccine: completed  Tdap vaccine: due  Shingles vaccine: will consider     Advanced directives: none   Conditions/risks identified: none   Next appointment: none    Preventive Care 38 Years and Older, Female Preventive care refers to lifestyle choices and visits with your health care provider that can promote health and wellness. What does preventive care include? A yearly physical exam. This is also called an annual well check. Dental exams once or twice a year. Routine eye exams. Ask your health care provider how often you should have your eyes checked. Personal lifestyle choices, including: Daily care of your teeth and gums. Regular physical activity. Eating a healthy diet. Avoiding tobacco and drug use. Limiting alcohol use. Practicing safe sex. Taking low-dose aspirin every day. Taking vitamin and mineral supplements as recommended by your health care provider. What happens during an annual well check? The services and screenings done by your health care provider during your annual well check will depend on your age, overall health, lifestyle risk factors, and family history of disease. Counseling  Your health care provider may ask you questions about your: Alcohol use. Tobacco use. Drug use. Emotional well-being. Home and relationship well-being. Sexual activity. Eating habits. History of falls. Memory and  ability to understand (cognition). Work and work Statistician. Reproductive health. Screening  You may have the following tests or measurements: Height, weight, and BMI. Blood pressure. Lipid and cholesterol levels. These may be checked every 5 years, or more frequently if you are over 28 years old. Skin check. Lung cancer screening. You may have this screening every year starting at age 43 if you have a 30-pack-year history of smoking and currently smoke or have quit within the past 15 years. Fecal occult blood test (FOBT) of the stool. You may have this test every year starting at age 45. Flexible sigmoidoscopy or colonoscopy. You may have a sigmoidoscopy every 5 years or a colonoscopy every 10 years starting at age 15. Hepatitis C blood test. Hepatitis B blood test. Sexually transmitted disease (STD) testing. Diabetes screening. This is done by checking your blood sugar (glucose) after you have not eaten for a while (fasting). You may have this done every 1-3 years. Bone density scan. This is done to screen for osteoporosis. You may have this done starting at age 51. Mammogram. This may be done every 1-2 years. Talk to your health care provider about how often you should have regular mammograms. Talk with your health care provider about your test results, treatment options, and if necessary, the need for more tests. Vaccines  Your health care provider may recommend certain vaccines, such as: Influenza vaccine. This is recommended every year. Tetanus, diphtheria, and acellular pertussis (Tdap, Td) vaccine. You may need a Td booster every 10 years. Zoster vaccine. You may need this after age 60. Pneumococcal 13-valent conjugate (PCV13) vaccine. One dose is recommended after age 42. Pneumococcal polysaccharide (PPSV23) vaccine. One dose is recommended after age 51. Talk to your health care provider about which screenings  and vaccines you need and how often you need them. This information is  not intended to replace advice given to you by your health care provider. Make sure you discuss any questions you have with your health care provider. Document Released: 06/14/2015 Document Revised: 02/05/2016 Document Reviewed: 03/19/2015 Elsevier Interactive Patient Education  2017 [employer] Prevention in the Home Falls can cause injuries. They can happen to people of all ages. There are many things you can do to make your home safe and to help prevent falls. What can I do on the outside of my home? Regularly fix the edges of walkways and driveways and fix any cracks. Remove anything that might make you trip as you walk through a door, such as a raised step or threshold. Trim any bushes or trees on the path to your home. Use bright outdoor lighting. Clear any walking paths of anything that might make someone trip, such as rocks or tools. Regularly check to see if handrails are loose or broken. Make sure that both sides of any steps have handrails. Any raised decks and porches should have guardrails on the edges. Have any leaves, snow, or ice cleared regularly. Use sand or salt on walking paths during winter. Clean up any spills in your garage right away. This includes oil or grease spills. What can I do in the bathroom? Use night lights. Install grab bars by the toilet and in the tub and shower. Do not use towel bars as grab bars. Use non-skid mats or decals in the tub or shower. If you need to sit down in the shower, use a plastic, non-slip stool. Keep the floor dry. Clean up any water that spills on the floor as soon as it happens. Remove soap buildup in the tub or shower regularly. Attach bath mats securely with double-sided non-slip rug tape. Do not have throw rugs and other things on the floor that can make you trip. What can I do in the bedroom? Use night lights. Make sure that you have a light by your bed that is easy to reach. Do not use any sheets or blankets that  are too big for your bed. They should not hang down onto the floor. Have a firm chair that has side arms. You can use this for support while you get dressed. Do not have throw rugs and other things on the floor that can make you trip. What can I do in the kitchen? Clean up any spills right away. Avoid walking on wet floors. Keep items that you use a lot in easy-to-reach places. If you need to reach something above you, use a strong step stool that has a grab bar. Keep electrical cords out of the way. Do not use floor polish or wax that makes floors slippery. If you must use wax, use non-skid floor wax. Do not have throw rugs and other things on the floor that can make you trip. What can I do with my stairs? Do not leave any items on the stairs. Make sure that there are handrails on both sides of the stairs and use them. Fix handrails that are broken or loose. Make sure that handrails are as long as the stairways. Check any carpeting to make sure that it is firmly attached to the stairs. Fix any carpet that is loose or worn. Avoid having throw rugs at the top or bottom of the stairs. If you do have throw rugs, attach them to the floor with carpet tape.  Make sure that you have a light switch at the top of the stairs and the bottom of the stairs. If you do not have them, ask someone to add them for you. What else can I do to help prevent falls? Wear shoes that: Do not have high heels. Have rubber bottoms. Are comfortable and fit you well. Are closed at the toe. Do not wear sandals. If you use a stepladder: Make sure that it is fully opened. Do not climb a closed stepladder. Make sure that both sides of the stepladder are locked into place. Ask someone to hold it for you, if possible. Clearly mark and make sure that you can see: Any grab bars or handrails. First and last steps. Where the edge of each step is. Use tools that help you move around (mobility aids) if they are needed. These  include: Canes. Walkers. Scooters. Crutches. Turn on the lights when you go into a dark area. Replace any light bulbs as soon as they burn out. Set up your furniture so you have a clear path. Avoid moving your furniture around. If any of your floors are uneven, fix them. If there are any pets around you, be aware of where they are. Review your medicines with your doctor. Some medicines can make you feel dizzy. This can increase your chance of falling. Ask your doctor what other things that you can do to help prevent falls. This information is not intended to replace advice given to you by your health care provider. Make sure you discuss any questions you have with your health care provider. Document Released: 03/14/2009 Document Revised: 10/24/2015 Document Reviewed: 06/22/2014 Elsevier Interactive Patient Education  2017 [employer].

## 2021-07-25 NOTE — Progress Notes (Signed)
Subjective:   Amy Dunn is a 81 y.o. female who presents for Medicare Annual (Subsequent) preventive examination.   I connected with Amy Dunn today by telephone and verified that I am speaking with the correct person using two identifiers. Location patient: home Location provider: work Persons participating in the virtual visit: patient, provider.   I discussed the limitations, risks, security and privacy concerns of performing an evaluation and management service by telephone and the availability of in person appointments. I also discussed with the patient that there may be a patient responsible charge related to this service. The patient expressed understanding and verbally consented to this telephonic visit.    Interactive audio and video telecommunications were attempted between this provider and patient, however failed, due to patient having technical difficulties OR patient did not have access to video capability.  We continued and completed visit with audio only.    Review of Systems     Cardiac Risk Factors include: advanced age (>37men, >22 women);hypertension     Objective:    Today's Vitals   There is no height or weight on file to calculate BMI.  Advanced Directives 04/21/2021 02/22/2020 02/02/2019 04/15/2016 04/09/2015 10/01/2014 03/29/2014  Does Patient Have a Medical Advance Directive? No No No No No No No  Would patient like information on creating a medical advance directive? - No - Patient declined Yes (ED - Information included in AVS) No - patient declined information No - patient declined information No - patient declined information No - patient declined information    Current Medications (verified) Outpatient Encounter Medications as of 07/25/2021  Medication Sig   amLODipine (NORVASC) 5 MG tablet Take 1 tablet (5 mg total) by mouth daily.   Cyanocobalamin (VITAMIN B12 PO) Take 1 tablet by mouth daily.   diclofenac Sodium (VOLTAREN) 1 % GEL Apply 2 g  topically 4 (four) times daily.   fexofenadine (ALLEGRA) 180 MG tablet Take 180 mg by mouth daily as needed. For allergies   fluticasone (FLONASE) 50 MCG/ACT nasal spray Place 2 sprays into both nostrils daily as needed for allergies or rhinitis.   hydrochlorothiazide (HYDRODIURIL) 25 MG tablet Take 1 tablet (25 mg total) by mouth daily.   losartan (COZAAR) 100 MG tablet Take 1 tablet (100 mg total) by mouth daily.   metoprolol succinate (TOPROL-XL) 50 MG 24 hr tablet TAKE 1 TABLET BY MOUTH DAILY WITH OR IMMEDIATELY FOLLOWING A MEAL   neomycin-polymyxin-hydrocortisone (CORTISPORIN) 3.5-10000-1 OTIC suspension Place 3 drops into both ears 3 (three) times daily.   omeprazole (PRILOSEC) 20 MG capsule Take 20 mg by mouth daily.   promethazine-dextromethorphan (PROMETHAZINE-DM) 6.25-15 MG/5ML syrup Take 5 mLs by mouth 4 (four) times daily as needed for cough.   traMADol (ULTRAM) 50 MG tablet TAKE 1 TABLET BY MOUTH EVERY 12 HOURS AS NEEDED   No facility-administered encounter medications on file as of 07/25/2021.    Allergies (verified) Penicillins   History: Past Medical History:  Diagnosis Date   Acute peptic ulcer, unspecified site, with hemorrhage and perforation, without mention of obstruction    Cancer of left lung ([facility]) 2015   Cough    GERD (gastroesophageal reflux disease)    Glaucoma    right eye   History of colonic polyps    Hypertension    Osteoarthritis of left shoulder    Primary osteoarthritis of left hip    Pulmonary nodule, left    Left Lower lobe   Stroke ([place])    no residual  TIA (transient ischemic attack) 2011   transient blindness, speech- resolved   Past Surgical History:  Procedure Laterality Date   COLON SURGERY     EYE SURGERY Bilateral    cataracts   surgical repair of peptic ulcer     TUBAL LIGATION     VIDEO ASSISTED THORACOSCOPY (VATS)/WEDGE RESECTION Left 01/29/2014   Procedure: VIDEO ASSISTED THORACOSCOPY (VATS)/BASILAR SEGMENTECTOMY  LEFT LUNG  LOWER LOBE WITH FROZEN SECTION, LEFT LOWER LOBECTOMY;  Surgeon: Melrose Nakayama, MD;  Location: [facility];  Service: Thoracic;  Laterality: Left;   Family History  Problem Relation Age of Onset   Hypertension Mother    Heart disease Mother    Heart attack Mother    Cancer Father        Colon cancer   Social History   Socioeconomic History   Marital status: Married    Spouse name: Not on file   Number of children: 2   Years of education: Not on file   Highest education level: Not on file  Occupational History   Occupation: retired  Tobacco Use   Smoking status: Former    Years: 1.00    Types: Cigarettes    Quit date: 05/26/1981    Years since quitting: 40.1   Smokeless tobacco: Never   Tobacco comments:    2 ciggs per day  Vaping Use   Vaping Use: Never used  Substance and Sexual Activity   Alcohol use: No   Drug use: No   Sexual activity: Not Currently  Other Topics Concern   Not on file  Social History Narrative   HSG   Married- '62   2 daughters- '65, '70; 3 grandchildren   Occupation retired- Lawyer of life: No CPR, no intubation, no heroic measures (pt provided end of life packet)   Social Determinants of Health   Financial Resource Strain: Low Risk    Difficulty of Paying Living Expenses: Not hard at all  Food Insecurity: No Food Insecurity   Worried About Charity fundraiser in the Last Year: Never true   Arboriculturist in the Last Year: Never true  Transportation Needs: No Transportation Needs   Lack of Transportation (Medical): No   Lack of Transportation (Non-Medical): No  Physical Activity: Insufficiently Active   Days of Exercise per Week: 2 days   Minutes of Exercise per Session: 30 min  Stress: No Stress Concern Present   Feeling of Stress : Not at all  Social Connections: Moderately Isolated   Frequency of Communication with Friends and Family: Three times a week   Frequency of Social Gatherings with Friends and Family: Three  times a week   Attends Religious Services: Never   Active Member of Clubs or Organizations: No   Attends Music therapist: Never   Marital Status: Married    Tobacco Counseling Counseling given: Not Answered Tobacco comments: 2 ciggs per day   Clinical Intake:  Pre-visit preparation completed: Yes  Pain : No/denies pain     Nutritional Risks: None Diabetes: No  How often do you need to have someone help you when you read instructions, pamphlets, or other written materials from your doctor or pharmacy?: 1 - Never What is the last grade level you completed in school?: High school  Diabetic?no   Interpreter Needed?: No  Information entered by :: L.Josey Forcier,LPN   Activities of Daily Living In your present state of health, do you have any difficulty performing the  following activities: 07/25/2021  Hearing? N  Vision? N  Difficulty concentrating or making decisions? N  Walking or climbing stairs? N  Dressing or bathing? N  Doing errands, shopping? N  Preparing Food and eating ? N  Using the Toilet? N  In the past six months, have you accidently leaked urine? N  Do you have problems with loss of bowel control? N  Managing your Medications? N  Managing your Finances? N  Housekeeping or managing your Housekeeping? N  Some recent data might be hidden    Patient Care Team: [name], MD as PCP - General (Internal Medicine) Curt Bears, MD as Consulting Physician (Oncology) Clent Jacks, MD as Consulting Physician (Ophthalmology)  Indicate any recent Medical Services you may have received from other than [facility] providers in the past year (date may be approximate).     Assessment:   This is a routine wellness examination for [facility].  Hearing/Vision screen Vision Screening - Comments:: Annual eye exams wear glasses   Dietary issues and exercise activities discussed: Current Exercise Habits: Home exercise routine, Type of exercise: walking,  Time (Minutes): 30, Frequency (Times/Week): 2, Weekly Exercise (Minutes/Week): 60, Intensity: Mild, Exercise limited by: None identified   Goals Addressed             This Visit's Progress    Patient Stated   On track    Continue to enjoy doing yard work and gardening. Stay as active as possible, eat healthy and continue to ride stationary bicycle.       Depression Screen PHQ 2/9 Scores 07/25/2021 07/25/2021 10/09/2020 02/22/2020 03/08/2019 02/02/2019 05/24/2017  PHQ - 2 Score 0 0 0 0 0 0 0    Fall Risk Fall Risk  07/25/2021 10/09/2020 02/22/2020 03/08/2019 02/02/2019  Falls in the past year? 0 0 0 0 0  Number falls in past yr: 0 0 0 0 0  Injury with Fall? 0 0 0 0 0  Risk for fall due to : - No Fall Risks No Fall Risks - -  Follow up Falls evaluation completed Falls evaluation completed - - -    FALL RISK PREVENTION PERTAINING TO THE HOME:  Any stairs in or around the home? Yes  If so, are there any without handrails? No  Home free of loose throw rugs in walkways, pet beds, electrical cords, etc? Yes  Adequate lighting in your home to reduce risk of falls? Yes   ASSISTIVE DEVICES UTILIZED TO PREVENT FALLS:  Life alert? No  Use of a cane, walker or w/c? No  Grab bars in the bathroom? No  Shower chair or bench in shower? No  Elevated toilet seat or a handicapped toilet? No    Cognitive Function:  Normal cognitive status assessed by direct observation by this Nurse Health Advisor. No abnormalities found.     6CIT Screen 02/02/2019  What Year? 0 points  What month? 0 points  What time? 0 points  Count back from 20 2 points  Months in reverse 2 points  Repeat phrase 2 points  Total Score 6    Immunizations Immunization History  Administered Date(s) Administered   Fluad Quad(high Dose 65+) 02/02/2019, 04/01/2021   Influenza Whole 04/16/2009, 02/17/2010, 03/02/2011   Influenza, High Dose Seasonal PF 03/14/2013, 03/10/2016, 03/05/2017, 03/30/2018   Influenza-Unspecified  03/19/2014, 01/23/2015   PFIZER(Purple Top)SARS-COV-2 Vaccination 07/15/2019, 08/09/2019   Pneumococcal Conjugate-13 04/01/2015   Pneumococcal Polysaccharide-23 02/17/2010   Td 03/20/2010    TDAP status: Due, Education has been provided regarding  the importance of this vaccine. Advised may receive this vaccine at local pharmacy or Health Dept. Aware to provide a copy of the vaccination record if obtained from local pharmacy or Health Dept. Verbalized acceptance and understanding.  Flu Vaccine status: Up to date  Pneumococcal vaccine status: Up to date  Covid-19 vaccine status: Completed vaccines  Qualifies for Shingles Vaccine? Yes   Zostavax completed No   Shingrix Completed?: No.    Education has been provided regarding the importance of this vaccine. Patient has been advised to call insurance company to determine out of pocket expense if they have not yet received this vaccine. Advised may also receive vaccine at local pharmacy or Health Dept. Verbalized acceptance and understanding.  Screening Tests Health Maintenance  Topic Date Due   Zoster Vaccines- Shingrix (1 of 2) Never done   COVID-19 Vaccine (3 - Pfizer risk series) 09/06/2019   TETANUS/TDAP  10/09/2021 (Originally 03/20/2020)   Pneumonia Vaccine 43+ Years old  Completed   INFLUENZA VACCINE  Completed   DEXA SCAN  Addressed   HPV VACCINES  Aged Out    Health Maintenance  Health Maintenance Due  Topic Date Due   Zoster Vaccines- Shingrix (1 of 2) Never done   COVID-19 Vaccine (3 - Pfizer risk series) 09/06/2019    Colorectal cancer screening: No longer required.   Mammogram status: No longer required due to age.  Bone Density status: Completed 06/15/2014. Results reflect: Bone density results: OSTEOPENIA. Repeat every 5 years.  Lung Cancer Screening: (Low Dose CT Chest recommended if Age 60-80 years, 30 pack-year currently smoking OR have quit w/in 15years.) does not qualify.   Lung Cancer Screening Referral:  n/a  Additional Screening:  Hepatitis C Screening: does not qualify;   Vision Screening: Recommended annual ophthalmology exams for early detection of glaucoma and other disorders of the eye. Is the patient up to date with their annual eye exam?  Yes  Who is the provider or what is the name of the office in which the patient attends annual eye exams? Dr.groat  If pt is not established with a provider, would they like to be referred to a provider to establish care? No .   Dental Screening: Recommended annual dental exams for proper oral hygiene  Community Resource Referral / Chronic Care Management: CRR required this visit?  No   CCM required this visit?  No      Plan:     I have personally reviewed and noted the following in the patients chart:   Medical and social history Use of alcohol, tobacco or illicit drugs  Current medications and supplements including opioid prescriptions.  Functional ability and status Nutritional status Physical activity Advanced directives List of other physicians Hospitalizations, surgeries, and ER visits in previous 12 months Vitals Screenings to include cognitive, depression, and falls Referrals and appointments  In addition, I have reviewed and discussed with patient certain preventive protocols, quality metrics, and best practice recommendations. A written personalized care plan for preventive services as well as general preventive health recommendations were provided to patient.     Randel Pigg, LPN   09/27/7679   Nurse Notes: none

## 2021-08-27 ENCOUNTER — Other Ambulatory Visit: Payer: Self-pay | Admitting: Internal Medicine

## 2021-09-04 ENCOUNTER — Encounter: Payer: Self-pay | Admitting: Internal Medicine

## 2021-09-04 ENCOUNTER — Ambulatory Visit (INDEPENDENT_AMBULATORY_CARE_PROVIDER_SITE_OTHER): Payer: Medicare HMO | Admitting: Internal Medicine

## 2021-09-04 VITALS — BP 124/78 | HR 75 | Resp 18 | Ht 65.0 in | Wt 117.6 lb

## 2021-09-04 DIAGNOSIS — J3089 Other allergic rhinitis: Secondary | ICD-10-CM | POA: Diagnosis not present

## 2021-09-04 MED ORDER — METHYLPREDNISOLONE ACETATE 40 MG/ML IJ SUSP
40.0000 mg | Freq: Once | INTRAMUSCULAR | Status: AC
  Administered 2021-09-04: 40 mg via INTRAMUSCULAR

## 2021-09-04 NOTE — Patient Instructions (Signed)
We have given you the shot today which should help. ? ?Drink plenty of fluids and consider adding some gatorade while not eating well. ?

## 2021-09-04 NOTE — Assessment & Plan Note (Signed)
Symptoms ongoing for 5-6 days. Likely viral trigger or pollen. Given depo-medrol 40 mg IM to help reduce sinus drainage. Continue flonase and allegra to help.  ?

## 2021-09-04 NOTE — Progress Notes (Signed)
? ?  Subjective:  ? ?Patient ID: Amy Dunn, female    DOB: [birth year], 81 y.o.   MRN: [id number] ? ?HPI ?The patient is an 81 YO female coming in for sick visit.  ? ?Review of Systems  ?Constitutional:  Positive for appetite change. Negative for activity change, chills, fatigue, fever and unexpected weight change.  ?HENT:  Positive for congestion, postnasal drip, rhinorrhea and sinus pressure. Negative for ear discharge, ear pain, sinus pain, sneezing, sore throat, tinnitus, trouble swallowing and voice change.   ?Eyes: Negative.   ?Respiratory:  Positive for cough. Negative for chest tightness, shortness of breath and wheezing.   ?Cardiovascular: Negative.   ?Gastrointestinal: Negative.   ?Musculoskeletal:  Negative for myalgias.  ?Neurological:  Positive for headaches.  ? ?Objective:  ?Physical Exam ?Constitutional:   ?   Appearance: She is well-developed.  ?HENT:  ?   Head: Normocephalic and atraumatic.  ?   Comments: Oropharynx with redness and clear drainage, nose with swollen turbinates, TMs normal bilaterally.  ?Neck:  ?   Thyroid: No thyromegaly.  ?Cardiovascular:  ?   Rate and Rhythm: Normal rate and regular rhythm.  ?Pulmonary:  ?   Effort: Pulmonary effort is normal. No respiratory distress.  ?   Breath sounds: Normal breath sounds. No wheezing or rales.  ?Abdominal:  ?   Palpations: Abdomen is soft.  ?Musculoskeletal:     ?   General: No tenderness.  ?   Cervical back: Normal range of motion.  ?Lymphadenopathy:  ?   Cervical: No cervical adenopathy.  ?Skin: ?   General: Skin is warm and dry.  ?Neurological:  ?   Mental Status: She is alert and oriented to person, place, and time.  ? ? ?Vitals:  ? 09/04/21 0803  ?BP: 124/78  ?Pulse: 75  ?Resp: 18  ?SpO2: 97%  ?Weight: 117 lb 9.6 oz (53.3 kg)  ?Height: 5\' 5"  (1.651 m)  ? ? ?This visit occurred during the SARS-CoV-2 public health emergency.  Safety protocols were in place, including screening questions prior to the visit, additional usage of staff PPE, and  extensive cleaning of exam room while observing appropriate contact time as indicated for disinfecting solutions.  ? ?Assessment & Plan:  ?Depo-medrol 40 mg IM given ?

## 2021-09-08 ENCOUNTER — Encounter (INDEPENDENT_AMBULATORY_CARE_PROVIDER_SITE_OTHER): Payer: Medicare HMO | Admitting: Ophthalmology

## 2021-09-29 ENCOUNTER — Encounter (INDEPENDENT_AMBULATORY_CARE_PROVIDER_SITE_OTHER): Payer: Medicare HMO | Admitting: Ophthalmology

## 2021-10-16 ENCOUNTER — Ambulatory Visit (INDEPENDENT_AMBULATORY_CARE_PROVIDER_SITE_OTHER): Payer: Medicare HMO | Admitting: Internal Medicine

## 2021-10-16 ENCOUNTER — Encounter: Payer: Self-pay | Admitting: Internal Medicine

## 2021-10-16 VITALS — BP 124/68 | HR 78 | Resp 18 | Ht 65.0 in | Wt 115.6 lb

## 2021-10-16 DIAGNOSIS — J069 Acute upper respiratory infection, unspecified: Secondary | ICD-10-CM | POA: Diagnosis not present

## 2021-10-16 MED ORDER — METHYLPREDNISOLONE ACETATE 40 MG/ML IJ SUSP
40.0000 mg | Freq: Once | INTRAMUSCULAR | Status: AC
  Administered 2021-10-16: 40 mg via INTRAMUSCULAR

## 2021-10-16 MED ORDER — PROMETHAZINE-DM 6.25-15 MG/5ML PO SYRP: 5.0000 mL | ORAL_SOLUTION | Freq: Four times a day (QID) | ORAL | 0 refills | Status: DC | PRN

## 2021-10-16 MED ORDER — AZITHROMYCIN 250 MG PO TABS: ORAL_TABLET | ORAL | 0 refills | Status: AC

## 2021-10-16 NOTE — Progress Notes (Signed)
   Subjective:   Patient ID: Amy Dunn, female    DOB: 05/27/1941, 81 y.o.   MRN: [id number]  HPI The patient is an 81 YO female coming in for sick visit.   Review of Systems  Constitutional:  Positive for activity change and appetite change. Negative for chills, fatigue, fever and unexpected weight change.  HENT:  Positive for congestion, postnasal drip and sinus pressure. Negative for ear discharge, ear pain, rhinorrhea, sinus pain, sneezing, sore throat, tinnitus, trouble swallowing and voice change.   Eyes: Negative.   Respiratory:  Positive for cough and shortness of breath. Negative for chest tightness and wheezing.   Cardiovascular: Negative.   Gastrointestinal: Negative.   Musculoskeletal:  Positive for myalgias.  Neurological:  Positive for headaches.   Objective:  Physical Exam Constitutional:      Appearance: She is well-developed.  HENT:     Head: Normocephalic and atraumatic.     Comments: Oropharynx with redness and clear drainage, nose with swollen turbinates, TMs normal bilaterally.  Neck:     Thyroid: No thyromegaly.  Cardiovascular:     Rate and Rhythm: Normal rate and regular rhythm.  Pulmonary:     Effort: Pulmonary effort is normal. No respiratory distress.     Breath sounds: Normal breath sounds. No wheezing or rales.  Abdominal:     Palpations: Abdomen is soft.  Musculoskeletal:        General: No tenderness.     Cervical back: Normal range of motion.  Lymphadenopathy:     Cervical: No cervical adenopathy.  Skin:    General: Skin is warm and dry.  Neurological:     Mental Status: She is alert and oriented to person, place, and time.    Vitals:   10/16/21 0903  BP: 124/68  Pulse: 78  Resp: 18  SpO2: 98%  Weight: 115 lb 9.6 oz (52.4 kg)  Height: 5\' 5"  (1.651 m)    Assessment & Plan:  Depo-medrol 40 mg IM given at visit

## 2021-10-16 NOTE — Assessment & Plan Note (Signed)
Given depo-medrol 40 mg IM at visit today. Refilled promethazine/dm cough syrup to use for cough. Rx azithromycin to take in 2-3 days if not improving.

## 2021-10-16 NOTE — Patient Instructions (Signed)
We have refilled the cough medicine today. We have also given you the steroid shot.  We have sent in azithromcyin (z-pack) to take in 1-2 days if not feeling better. If needed take 2 pills on the first day then 1 pill daily on days 2-5.

## 2021-11-10 ENCOUNTER — Encounter (INDEPENDENT_AMBULATORY_CARE_PROVIDER_SITE_OTHER): Payer: Medicare HMO | Admitting: Ophthalmology

## 2021-11-10 DIAGNOSIS — H348312 Tributary (branch) retinal vein occlusion, right eye, stable: Secondary | ICD-10-CM | POA: Diagnosis not present

## 2021-11-10 DIAGNOSIS — I10 Essential (primary) hypertension: Secondary | ICD-10-CM

## 2021-11-10 DIAGNOSIS — H43813 Vitreous degeneration, bilateral: Secondary | ICD-10-CM

## 2021-11-10 DIAGNOSIS — H35033 Hypertensive retinopathy, bilateral: Secondary | ICD-10-CM

## 2021-11-19 ENCOUNTER — Other Ambulatory Visit: Payer: Self-pay | Admitting: Internal Medicine

## 2021-11-21 ENCOUNTER — Other Ambulatory Visit: Payer: Self-pay | Admitting: Internal Medicine

## 2022-01-21 ENCOUNTER — Telehealth: Payer: Self-pay

## 2022-01-21 NOTE — Telephone Encounter (Signed)
Last CPE 10/09/20  Pt notified of above & appt scheduled.

## 2022-02-11 ENCOUNTER — Ambulatory Visit (INDEPENDENT_AMBULATORY_CARE_PROVIDER_SITE_OTHER): Payer: Medicare HMO | Admitting: Internal Medicine

## 2022-02-11 ENCOUNTER — Encounter: Payer: Self-pay | Admitting: Internal Medicine

## 2022-02-11 VITALS — BP 110/68 | HR 78 | Ht 63.5 in | Wt 112.0 lb

## 2022-02-11 DIAGNOSIS — M159 Polyosteoarthritis, unspecified: Secondary | ICD-10-CM | POA: Diagnosis not present

## 2022-02-11 DIAGNOSIS — D320 Benign neoplasm of cerebral meninges: Secondary | ICD-10-CM | POA: Diagnosis not present

## 2022-02-11 DIAGNOSIS — Z23 Encounter for immunization: Secondary | ICD-10-CM | POA: Diagnosis not present

## 2022-02-11 DIAGNOSIS — C3492 Malignant neoplasm of unspecified part of left bronchus or lung: Secondary | ICD-10-CM

## 2022-02-11 DIAGNOSIS — I10 Essential (primary) hypertension: Secondary | ICD-10-CM

## 2022-02-11 DIAGNOSIS — K219 Gastro-esophageal reflux disease without esophagitis: Secondary | ICD-10-CM

## 2022-02-11 DIAGNOSIS — Z0000 Encounter for general adult medical examination without abnormal findings: Secondary | ICD-10-CM | POA: Diagnosis not present

## 2022-02-11 LAB — COMPREHENSIVE METABOLIC PANEL
ALT: 10 U/L (ref 0–35)
AST: 16 U/L (ref 0–37)
Albumin: 4.1 g/dL (ref 3.5–5.2)
Alkaline Phosphatase: 90 U/L (ref 39–117)
BUN: 15 mg/dL (ref 6–23)
CO2: 30 mEq/L (ref 19–32)
Calcium: 9.8 mg/dL (ref 8.4–10.5)
Chloride: 100 mEq/L (ref 96–112)
Creatinine, Ser: 1.28 mg/dL — ABNORMAL HIGH (ref 0.40–1.20)
GFR: 38.79 mL/min — ABNORMAL LOW (ref >60.00)
Glucose, Bld: 91 mg/dL (ref 70–99)
Potassium: 3.7 mEq/L (ref 3.5–5.1)
Sodium: 139 mEq/L (ref 135–145)
Total Bilirubin: 0.6 mg/dL (ref 0.2–1.2)
Total Protein: 7.7 g/dL (ref 6.0–8.3)

## 2022-02-11 LAB — CBC
HCT: 32.9 % — ABNORMAL LOW (ref 36.0–46.0)
Hemoglobin: 11.5 g/dL — ABNORMAL LOW (ref 12.0–15.0)
MCHC: 35 g/dL (ref 30.0–36.0)
MCV: 78.8 fl (ref 78.0–100.0)
Platelets: 202 10*3/uL (ref 150.0–400.0)
RBC: 4.17 Mil/uL (ref 3.87–5.11)
RDW: 14.8 % (ref 11.5–15.5)
WBC: 8.6 10*3/uL (ref 4.0–10.5)

## 2022-02-11 LAB — LIPID PANEL
Cholesterol: 197 mg/dL (ref 0–200)
HDL: 54.4 mg/dL (ref >39.00)
LDL Cholesterol: 114 mg/dL — ABNORMAL HIGH (ref 0–99)
NonHDL: 142.71
Total CHOL/HDL Ratio: 4
Triglycerides: 144 mg/dL (ref 0.0–149.0)
VLDL: 28.8 mg/dL (ref 0.0–40.0)

## 2022-02-11 MED ORDER — OMEPRAZOLE 40 MG PO CPDR: 40.0000 mg | DELAYED_RELEASE_CAPSULE | Freq: Every day | ORAL | 3 refills | Status: AC

## 2022-02-11 MED ORDER — ALBUTEROL SULFATE HFA 108 (90 BASE) MCG/ACT IN AERS: 2.0000 | INHALATION_SPRAY | Freq: Four times a day (QID) | RESPIRATORY_TRACT | 2 refills | Status: DC | PRN

## 2022-02-11 NOTE — Patient Instructions (Signed)
We will check the labs today and have given you the flu shot.

## 2022-02-11 NOTE — Assessment & Plan Note (Signed)
Flu shot given. Covid-19 counseled. Pneumonia complete. Shingrix counseled due at pharmacy. Tetanus counseled due at pharmacy. Colonoscopy aged out. Mammogram aged out, pap smear aged out and dexa complete. Counseled about sun safety and mole surveillance. Counseled about the dangers of distracted driving. Given 10 year screening recommendations.

## 2022-02-11 NOTE — Assessment & Plan Note (Signed)
Worsening symptoms. Increased omeprazole to 40 mg daily and new rx sent in.

## 2022-02-11 NOTE — Assessment & Plan Note (Signed)
No further follow up needed. No clinical symptoms of change.

## 2022-02-11 NOTE — Assessment & Plan Note (Signed)
Stable no change in symptoms. Stable SOB and rx albuterol to use prior to activity.

## 2022-02-11 NOTE — Assessment & Plan Note (Signed)
BP at goal on amlodipine 5 mg daily and losartan/hctz 100/25 mg daily and metoprolol 50 mg daily. Checking CMP and adjust as needed.

## 2022-02-11 NOTE — Progress Notes (Signed)
   Subjective:   Patient ID: Amy Dunn, female    DOB: August 03, 1940, 81 y.o.   MRN: [id number]  HPI The patient is here for physical.  PMH, [facility], social history reviewed and updated  Review of Systems  Constitutional: Negative.   HENT: Negative.    Eyes: Negative.   Respiratory:  Positive for shortness of breath. Negative for cough and chest tightness.   Cardiovascular:  Negative for chest pain, palpitations and leg swelling.  Gastrointestinal:  Negative for abdominal distention, abdominal pain, constipation, diarrhea, nausea and vomiting.  Musculoskeletal: Negative.   Skin: Negative.   Neurological: Negative.   Psychiatric/Behavioral: Negative.      Objective:  Physical Exam Constitutional:      Appearance: She is well-developed.  HENT:     Head: Normocephalic and atraumatic.  Cardiovascular:     Rate and Rhythm: Normal rate and regular rhythm.  Pulmonary:     Effort: Pulmonary effort is normal. No respiratory distress.     Breath sounds: Normal breath sounds. No wheezing or rales.  Abdominal:     General: Bowel sounds are normal. There is no distension.     Palpations: Abdomen is soft.     Tenderness: There is no abdominal tenderness. There is no rebound.  Musculoskeletal:     Cervical back: Normal range of motion.  Skin:    General: Skin is warm and dry.  Neurological:     Mental Status: She is alert and oriented to person, place, and time.     Coordination: Coordination normal.     Vitals:   02/11/22 0936  BP: 110/68  Pulse: 78  SpO2: 98%  Weight: 112 lb (50.8 kg)  Height: 5' 3.5" (1.613 m)    Assessment & Plan:  Flu shot given at visit
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
## 2022-02-12 ENCOUNTER — Other Ambulatory Visit: Payer: Self-pay | Admitting: Internal Medicine

## 2022-02-25 DIAGNOSIS — Z961 Presence of intraocular lens: Secondary | ICD-10-CM | POA: Diagnosis not present

## 2022-02-25 DIAGNOSIS — H401131 Primary open-angle glaucoma, bilateral, mild stage: Secondary | ICD-10-CM | POA: Diagnosis not present

## 2022-02-25 DIAGNOSIS — H43823 Vitreomacular adhesion, bilateral: Secondary | ICD-10-CM | POA: Diagnosis not present

## 2022-02-25 DIAGNOSIS — H31011 Macula scars of posterior pole (postinflammatory) (post-traumatic), right eye: Secondary | ICD-10-CM | POA: Diagnosis not present

## 2022-03-20 DIAGNOSIS — H903 Sensorineural hearing loss, bilateral: Secondary | ICD-10-CM | POA: Diagnosis not present

## 2022-03-20 DIAGNOSIS — H9311 Tinnitus, right ear: Secondary | ICD-10-CM | POA: Diagnosis not present

## 2022-03-23 ENCOUNTER — Other Ambulatory Visit: Payer: Self-pay | Admitting: Otolaryngology

## 2022-03-23 DIAGNOSIS — H903 Sensorineural hearing loss, bilateral: Secondary | ICD-10-CM

## 2022-03-23 DIAGNOSIS — H9311 Tinnitus, right ear: Secondary | ICD-10-CM

## 2022-04-12 ENCOUNTER — Ambulatory Visit
Admission: RE | Admit: 2022-04-12 | Discharge: 2022-04-12 | Disposition: A | Discharge status: Home / Self Care | Payer: Medicare HMO | Source: Ambulatory Visit | Attending: Otolaryngology | Admitting: Otolaryngology

## 2022-04-12 DIAGNOSIS — H9311 Tinnitus, right ear: Secondary | ICD-10-CM

## 2022-04-12 DIAGNOSIS — H903 Sensorineural hearing loss, bilateral: Secondary | ICD-10-CM | POA: Diagnosis not present

## 2022-04-12 MED ORDER — GADOPICLENOL 0.5 MMOL/ML IV SOLN
5.0000 mL | Freq: Once | INTRAVENOUS | Status: AC | PRN
  Administered 2022-04-12: 5 mL via INTRAVENOUS

## 2022-04-28 ENCOUNTER — Other Ambulatory Visit: Payer: Self-pay | Admitting: Internal Medicine

## 2022-05-28 ENCOUNTER — Other Ambulatory Visit: Payer: Self-pay | Admitting: Internal Medicine

## 2022-08-05 ENCOUNTER — Telehealth: Payer: Self-pay

## 2022-08-05 NOTE — Telephone Encounter (Signed)
Left voice message for patient to return call to office to schedule AWV-S.  Last AWV was completed on 07/25/2021.  [name], LPN. [place] Team Direct Dial: [phone]

## 2022-08-06 ENCOUNTER — Ambulatory Visit (INDEPENDENT_AMBULATORY_CARE_PROVIDER_SITE_OTHER): Payer: Medicare HMO

## 2022-08-06 VITALS — Ht 63.5 in | Wt 112.0 lb

## 2022-08-06 DIAGNOSIS — Z0000 Encounter for general adult medical examination without abnormal findings: Secondary | ICD-10-CM | POA: Diagnosis not present

## 2022-08-06 NOTE — Patient Instructions (Signed)
Amy Dunn , Thank you for taking time to come for your Medicare Wellness Visit. I appreciate your ongoing commitment to your health goals. Please review the following plan we discussed and let me know if I can assist you in the future.   These are the goals we discussed:  Goals      Client understands the importance of follow-up with providers by attending scheduled visits        This is a list of the screening recommended for you and due dates:  Health Maintenance  Topic Date Due   Zoster (Shingles) Vaccine (1 of 2) Never done   COVID-19 Vaccine (3 - Pfizer risk series) 09/06/2019   DTaP/Tdap/Td vaccine (2 - Tdap) 03/20/2020   Medicare Annual Wellness Visit  08/06/2023   Pneumonia Vaccine  Completed   Flu Shot  Completed   DEXA scan (bone density measurement)  Addressed   HPV Vaccine  Aged Out    Advanced directives: No  Conditions/risks identified: Yes  Next appointment: Follow up in one year for your annual wellness visit.   Preventive Care 20 Years and Older, Female Preventive care refers to lifestyle choices and visits with your health care provider that can promote health and wellness. What does preventive care include? A yearly physical exam. This is also called an annual well check. Dental exams once or twice a year. Routine eye exams. Ask your health care provider how often you should have your eyes checked. Personal lifestyle choices, including: Daily care of your teeth and gums. Regular physical activity. Eating a healthy diet. Avoiding tobacco and drug use. Limiting alcohol use. Practicing safe sex. Taking low-dose aspirin every day. Taking vitamin and mineral supplements as recommended by your health care provider. What happens during an annual well check? The services and screenings done by your health care provider during your annual well check will depend on your age, overall health, lifestyle risk factors, and family history of disease. Counseling   Your health care provider may ask you questions about your: Alcohol use. Tobacco use. Drug use. Emotional well-being. Home and relationship well-being. Sexual activity. Eating habits. History of falls. Memory and ability to understand (cognition). Work and work Statistician. Reproductive health. Screening  You may have the following tests or measurements: Height, weight, and BMI. Blood pressure. Lipid and cholesterol levels. These may be checked every 5 years, or more frequently if you are over 54 years old. Skin check. Lung cancer screening. You may have this screening every year starting at age 46 if you have a 30-pack-year history of smoking and currently smoke or have quit within the past 15 years. Fecal occult blood test (FOBT) of the stool. You may have this test every year starting at age 89. Flexible sigmoidoscopy or colonoscopy. You may have a sigmoidoscopy every 5 years or a colonoscopy every 10 years starting at age 51. Hepatitis C blood test. Hepatitis B blood test. Sexually transmitted disease (STD) testing. Diabetes screening. This is done by checking your blood sugar (glucose) after you have not eaten for a while (fasting). You may have this done every 1-3 years. Bone density scan. This is done to screen for osteoporosis. You may have this done starting at age 39. Mammogram. This may be done every 1-2 years. Talk to your health care provider about how often you should have regular mammograms. Talk with your health care provider about your test results, treatment options, and if necessary, the need for more tests. Vaccines  Your health care provider  may recommend certain vaccines, such as: Influenza vaccine. This is recommended every year. Tetanus, diphtheria, and acellular pertussis (Tdap, Td) vaccine. You may need a Td booster every 10 years. Zoster vaccine. You may need this after age 8. Pneumococcal 13-valent conjugate (PCV13) vaccine. One dose is recommended  after age 65. Pneumococcal polysaccharide (PPSV23) vaccine. One dose is recommended after age 23. Talk to your health care provider about which screenings and vaccines you need and how often you need them. This information is not intended to replace advice given to you by your health care provider. Make sure you discuss any questions you have with your health care provider. Document Released: 06/14/2015 Document Revised: 02/05/2016 Document Reviewed: 03/19/2015 Elsevier Interactive Patient Education  2017 [place] Prevention in the Home Falls can cause injuries. They can happen to people of all ages. There are many things you can do to make your home safe and to help prevent falls. What can I do on the outside of my home? Regularly fix the edges of walkways and driveways and fix any cracks. Remove anything that might make you trip as you walk through a door, such as a raised step or threshold. Trim any bushes or trees on the path to your home. Use bright outdoor lighting. Clear any walking paths of anything that might make someone trip, such as rocks or tools. Regularly check to see if handrails are loose or broken. Make sure that both sides of any steps have handrails. Any raised decks and porches should have guardrails on the edges. Have any leaves, snow, or ice cleared regularly. Use sand or salt on walking paths during winter. Clean up any spills in your garage right away. This includes oil or grease spills. What can I do in the bathroom? Use night lights. Install grab bars by the toilet and in the tub and shower. Do not use towel bars as grab bars. Use non-skid mats or decals in the tub or shower. If you need to sit down in the shower, use a plastic, non-slip stool. Keep the floor dry. Clean up any water that spills on the floor as soon as it happens. Remove soap buildup in the tub or shower regularly. Attach bath mats securely with double-sided non-slip rug tape. Do not  have throw rugs and other things on the floor that can make you trip. What can I do in the bedroom? Use night lights. Make sure that you have a light by your bed that is easy to reach. Do not use any sheets or blankets that are too big for your bed. They should not hang down onto the floor. Have a firm chair that has side arms. You can use this for support while you get dressed. Do not have throw rugs and other things on the floor that can make you trip. What can I do in the kitchen? Clean up any spills right away. Avoid walking on wet floors. Keep items that you use a lot in easy-to-reach places. If you need to reach something above you, use a strong step stool that has a grab bar. Keep electrical cords out of the way. Do not use floor polish or wax that makes floors slippery. If you must use wax, use non-skid floor wax. Do not have throw rugs and other things on the floor that can make you trip. What can I do with my stairs? Do not leave any items on the stairs. Make sure that there are handrails on both sides  of the stairs and use them. Fix handrails that are broken or loose. Make sure that handrails are as long as the stairways. Check any carpeting to make sure that it is firmly attached to the stairs. Fix any carpet that is loose or worn. Avoid having throw rugs at the top or bottom of the stairs. If you do have throw rugs, attach them to the floor with carpet tape. Make sure that you have a light switch at the top of the stairs and the bottom of the stairs. If you do not have them, ask someone to add them for you. What else can I do to help prevent falls? Wear shoes that: Do not have high heels. Have rubber bottoms. Are comfortable and fit you well. Are closed at the toe. Do not wear sandals. If you use a stepladder: Make sure that it is fully opened. Do not climb a closed stepladder. Make sure that both sides of the stepladder are locked into place. Ask someone to hold it for  you, if possible. Clearly mark and make sure that you can see: Any grab bars or handrails. First and last steps. Where the edge of each step is. Use tools that help you move around (mobility aids) if they are needed. These include: Canes. Walkers. Scooters. Crutches. Turn on the lights when you go into a dark area. Replace any light bulbs as soon as they burn out. Set up your furniture so you have a clear path. Avoid moving your furniture around. If any of your floors are uneven, fix them. If there are any pets around you, be aware of where they are. Review your medicines with your doctor. Some medicines can make you feel dizzy. This can increase your chance of falling. Ask your doctor what other things that you can do to help prevent falls. This information is not intended to replace advice given to you by your health care provider. Make sure you discuss any questions you have with your health care provider. Document Released: 03/14/2009 Document Revised: 10/24/2015 Document Reviewed: 06/22/2014 Elsevier Interactive Patient Education  2017 [employer].

## 2022-08-06 NOTE — Progress Notes (Signed)
I connected with  Amy Dunn on 08/06/22 by a audio enabled telemedicine application and verified that I am speaking with the correct person using two identifiers.  Patient Location: Home  Provider Location: Office/Clinic  I discussed the limitations of evaluation and management by telemedicine. The patient expressed understanding and agreed to proceed.  Subjective:   Amy Dunn is a 82 y.o. female who presents for Medicare Annual (Subsequent) preventive examination.  Review of Systems     Cardiac Risk Factors include: advanced age (>76mn, >>81women);hypertension;family history of premature cardiovascular disease     Objective:    Today's Vitals   08/06/22 1519  Weight: 112 lb (50.8 kg)  Height: 5' 3.5" (1.613 m)  PainSc: 0-No pain   Body mass index is 19.53 kg/m.     08/06/2022    3:23 PM 04/21/2021   11:33 AM 02/22/2020    9:07 AM 02/02/2019    9:55 AM 04/15/2016    9:47 AM 04/09/2015    9:16 AM 10/01/2014   11:26 AM  Advanced Directives  Does Patient Have a Medical Advance Directive? No No No No No No No  Would patient like information on creating a medical advance directive? No - Patient declined  No - Patient declined Yes (ED - Information included in AVS) No - patient declined information No - patient declined information No - patient declined information    Current Medications (verified) Outpatient Encounter Medications as of 08/06/2022  Medication Sig   albuterol (VENTOLIN HFA) 108 (90 Base) MCG/ACT inhaler Inhale 2 puffs into the lungs every 6 (six) hours as needed for wheezing or shortness of breath.   amLODipine (NORVASC) 5 MG tablet Take 1 tablet by mouth once daily   Cyanocobalamin (VITAMIN B12 PO) Take 1 tablet by mouth daily.   diclofenac Sodium (VOLTAREN) 1 % GEL Apply 2 g topically 4 (four) times daily.   fexofenadine (ALLEGRA) 180 MG tablet Take 180 mg by mouth daily as needed. For allergies   fluticasone (FLONASE) 50 MCG/ACT nasal spray Place 2 sprays  into both nostrils daily as needed for allergies or rhinitis.   hydrochlorothiazide (HYDRODIURIL) 25 MG tablet Take 1 tablet by mouth once daily   losartan (COZAAR) 100 MG tablet Take 1 tablet by mouth once daily   metoprolol succinate (TOPROL-XL) 50 MG 24 hr tablet TAKE 1 TABLET BY MOUTH DAILY WITH OR IMMEDIATELY FOLLOWING A MEAL   neomycin-polymyxin-hydrocortisone (CORTISPORIN) 3.5-10000-1 OTIC suspension Place 3 drops into both ears 3 (three) times daily.   omeprazole (PRILOSEC) 40 MG capsule Take 1 capsule (40 mg total) by mouth daily.   traMADol (ULTRAM) 50 MG tablet TAKE 1 TABLET BY MOUTH EVERY 12 HOURS AS NEEDED   promethazine-dextromethorphan (PROMETHAZINE-DM) 6.25-15 MG/5ML syrup Take 5 mLs by mouth 4 (four) times daily as needed for cough. (Patient not taking: Reported on 08/06/2022)   No facility-administered encounter medications on file as of 08/06/2022.    Allergies (verified) Penicillins   History: Past Medical History:  Diagnosis Date   Acute peptic ulcer, unspecified site, with hemorrhage and perforation, without mention of obstruction    Cancer of left lung ([facility] [place]   Cough    GERD (gastroesophageal reflux disease)    Glaucoma    right eye   History of colonic polyps    Hypertension    Osteoarthritis of left shoulder    Primary osteoarthritis of left hip    Pulmonary nodule, left    Left Lower lobe   [place]  no residual   TIA (transient ischemic attack) 2011   transient blindness, speech- resolved   Past Surgical History:  Procedure Laterality Date   COLON SURGERY     EYE SURGERY Bilateral    cataracts   surgical repair of peptic ulcer     TUBAL LIGATION     VIDEO ASSISTED THORACOSCOPY (VATS)/WEDGE RESECTION Left 01/29/2014   Procedure: VIDEO ASSISTED THORACOSCOPY (VATS)/BASILAR SEGMENTECTOMY  LEFT LUNG LOWER LOBE WITH FROZEN SECTION, LEFT LOWER LOBECTOMY;  Surgeon: Melrose Nakayama, MD;  Location: [facility];  Service: Thoracic;  Laterality: Left;    Family History  Problem Relation Age of Onset   Hypertension Mother    Heart disease Mother    Heart attack Mother    Cancer Father        Colon cancer   Social History   Socioeconomic History   Marital status: Married    Spouse name: Not on file   Number of children: 2   Years of education: Not on file   Highest education level: Not on file  Occupational History   Occupation: retired  Tobacco Use   Smoking status: Former    Years: 1.00    Types: Cigarettes    Quit date: 05/26/1981    Years since quitting: 41.2   Smokeless tobacco: Never   Tobacco comments:    2 ciggs per day  Vaping Use   Vaping Use: Never used  Substance and Sexual Activity   Alcohol use: No   Drug use: No   Sexual activity: Not Currently  Other Topics Concern   Not on file  Social History Narrative   HSG   Married- '62   2 daughters- '65, '70; 3 grandchildren   Occupation retired- Lawyer of life: No CPR, no intubation, no heroic measures (pt provided end of life packet)   Social Determinants of Health   Financial Resource Strain: [place]  (08/06/2022)   Overall Financial Resource Strain (CARDIA)    Difficulty of Paying Living Expenses: Not hard at all  Food Insecurity: No [place] (08/06/2022)   Hunger Vital Sign    Worried About Running Out of Food in the Last Year: Never true    [place] in the Last Year: Never true  Transportation Needs: No Transportation Needs (08/06/2022)   PRAPARE - Hydrologist (Medical): No    Lack of Transportation (Non-Medical): No  Physical Activity: Sufficiently Active (08/06/2022)   Exercise Vital Sign    Days of Exercise per Week: 5 days    Minutes of Exercise per Session: 30 min  Stress: No Stress Concern Present (08/06/2022)   [place]    Feeling of Stress : Not at all  Social Connections: Moderately Isolated (08/06/2022)   Social  Connection and Isolation Panel [NHANES]    Frequency of Communication with Friends and Family: Three times a week    Frequency of Social Gatherings with Friends and Family: Three times a week    Attends Religious Services: Never    Active Member of Clubs or Organizations: No    Attends Archivist Meetings: Never    Marital Status: Married    Tobacco Counseling Counseling given: Not Answered Tobacco comments: 2 ciggs per day   Clinical Intake:  Pre-visit preparation completed: Yes  Pain : No/denies pain Pain Score: 0-No pain     BMI - recorded: 19.53 Nutritional Status: BMI of  19-24  Normal Nutritional Risks: None Diabetes: No  How often do you need to have someone help you when you read instructions, pamphlets, or other written materials from your doctor or pharmacy?: 1 - Never What is the last grade level you completed in school?: HSG  Diabetic? No  Interpreter Needed?: No  Information entered by :: Lisette Abu, LPN.   Activities of Daily Living    08/06/2022    3:24 PM  In your present state of health, do you have any difficulty performing the following activities:  Hearing? 1  Vision? 0  Difficulty concentrating or making decisions? 0  Walking or climbing stairs? 0  Dressing or bathing? 0  Doing errands, shopping? 0  Preparing Food and eating ? N  Using the Toilet? N  In the past six months, have you accidently leaked urine? N  Do you have problems with loss of bowel control? N  Managing your Medications? N  Managing your Finances? N  Housekeeping or managing your Housekeeping? N    Patient Care Team: [name], MD as PCP - General (Internal Medicine) Curt Bears, MD as Consulting Physician (Oncology) Clent Jacks, MD as Consulting Physician (Ophthalmology) Hayden Pedro, MD as Consulting Physician (Ophthalmology)  Indicate any recent Medical Services you may have received from other than [facility] providers in the past  year (date may be approximate).     Assessment:   This is a routine wellness examination for [facility].  Hearing/Vision screen Hearing Screening - Comments:: Patient has difficulty hearing.  In the process of purchasing hearing aids. Vision Screening - Comments:: Wears rx glasses - up to date with routine eye exams with [facility] and Tempie Hoist, MD.   Dietary issues and exercise activities discussed: Current Exercise Habits: Home exercise routine, Type of exercise: walking, Time (Minutes): 30, Frequency (Times/Week): 5, Weekly Exercise (Minutes/Week): 150, Intensity: Moderate, Exercise limited by: None identified   Goals Addressed             This Visit's Progress    Client understands the importance of follow-up with providers by attending scheduled visits        Depression Screen    08/06/2022    3:21 PM 02/11/2022    9:36 AM 10/16/2021    9:07 AM 07/25/2021    2:43 PM 07/25/2021    2:41 PM 10/09/2020    9:49 AM 02/22/2020    9:11 AM  PHQ 2/9 Scores  PHQ - 2 Score 0 0 0 0 0 0 0  PHQ- 9 Score 0 0 2        Fall Risk    08/06/2022    3:24 PM 02/11/2022    9:37 AM 10/16/2021    9:07 AM 07/25/2021    2:42 PM 10/09/2020    9:48 AM  Fall Risk   Falls in the past year? 0 0 0 0 0  Number falls in past yr: 0  0 0 0  Injury with Fall? 0 0 0 0 0  Risk for fall due to : No Fall Risks    No Fall Risks  Follow up Falls prevention discussed   Falls evaluation completed Falls evaluation completed    FALL RISK PREVENTION PERTAINING TO THE HOME:  Any stairs in or around the home? Yes  If so, are there any without handrails? No  Home free of loose throw rugs in walkways, pet beds, electrical cords, etc? Yes  Adequate lighting in your home to reduce risk of falls?  Yes   ASSISTIVE DEVICES UTILIZED TO PREVENT FALLS:  Life alert? No  Use of a cane, walker or w/c? No  Grab bars in the bathroom? Yes  Shower chair or bench in shower? Yes  Elevated toilet seat or a handicapped toilet?  No   TIMED UP AND GO:  Was the test performed? No . Telephonic Visit   Cognitive Function:        08/06/2022    3:25 PM 02/02/2019    9:57 AM  6CIT Screen  What Year? 0 points 0 points  What month? 0 points 0 points  What time? 0 points 0 points  Count back from 20 0 points 2 points  Months in reverse 0 points 2 points  Repeat phrase 0 points 2 points  Total Score 0 points 6 points    Immunizations Immunization History  Administered Date(s) Administered   Fluad Quad(high Dose 65+) 02/02/2019, 04/01/2021, 02/11/2022   Influenza Whole 04/16/2009, 02/17/2010, 03/02/2011   Influenza, High Dose Seasonal PF 03/14/2013, 03/10/2016, 03/05/2017, 03/30/2018   Influenza-Unspecified 03/19/2014, 01/23/2015   PFIZER(Purple Top)SARS-COV-2 Vaccination 07/15/2019, 08/09/2019   Pneumococcal Conjugate-13 04/01/2015   Pneumococcal Polysaccharide-23 02/17/2010   Td 03/20/2010    TDAP status: Due, Education has been provided regarding the importance of this vaccine. Advised may receive this vaccine at local pharmacy or Health Dept. Aware to provide a copy of the vaccination record if obtained from local pharmacy or Health Dept. Verbalized acceptance and understanding.  Flu Vaccine status: Up to date  Pneumococcal vaccine status: Up to date  Covid-19 vaccine status: Completed vaccines  Qualifies for Shingles Vaccine? Yes   Zostavax completed No   Shingrix Completed?: No.    Education has been provided regarding the importance of this vaccine. Patient has been advised to call insurance company to determine out of pocket expense if they have not yet received this vaccine. Advised may also receive vaccine at local pharmacy or Health Dept. Verbalized acceptance and understanding.  Screening Tests Health Maintenance  Topic Date Due   Zoster Vaccines- Shingrix (1 of 2) Never done   COVID-19 Vaccine (3 - Pfizer risk series) 09/06/2019   DTaP/Tdap/Td (2 - Tdap) 03/20/2020   Medicare Annual  Wellness (AWV)  08/06/2023   Pneumonia Vaccine 81+ Years old  Completed   INFLUENZA VACCINE  Completed   DEXA SCAN  Addressed   HPV VACCINES  Aged Out    Health Maintenance  Health Maintenance Due  Topic Date Due   Zoster Vaccines- Shingrix (1 of 2) Never done   COVID-19 Vaccine (3 - Pfizer risk series) 09/06/2019   DTaP/Tdap/Td (2 - Tdap) 03/20/2020    Colorectal cancer screening: No longer required.   Mammogram status: No longer required due to age/patient declined.  Bone Density status: Never done  Lung Cancer Screening: (Low Dose CT Chest recommended if Age 25-80 years, 30 pack-year currently smoking OR have quit w/in 15years.) does not qualify.   Lung Cancer Screening Referral: no  Additional Screening:  Hepatitis C Screening: does not qualify; Completed no  Vision Screening: Recommended annual ophthalmology exams for early detection of glaucoma and other disorders of the eye. Is the patient up to date with their annual eye exam?  Yes  Who is the provider or what is the name of the office in which the patient attends annual eye exams? [facility] and Tempie Hoist, MD. If pt is not established with a provider, would they like to be referred to a provider to establish care? No .  Dental Screening: Recommended annual dental exams for proper oral hygiene  Community Resource Referral / Chronic Care Management: CRR required this visit?  No   CCM required this visit?  No      Plan:     I have personally reviewed and noted the following in the patient's chart:   Medical and social history Use of alcohol, tobacco or illicit drugs  Current medications and supplements including opioid prescriptions. Patient is not currently taking opioid prescriptions. Functional ability and status Nutritional status Physical activity Advanced directives List of other physicians Hospitalizations, surgeries, and ER visits in previous 12 months Vitals Screenings to include  cognitive, depression, and falls Referrals and appointments  In addition, I have reviewed and discussed with patient certain preventive protocols, quality metrics, and best practice recommendations. A written personalized care plan for preventive services as well as general preventive health recommendations were provided to patient.     Sheral Flow, LPN   [id number]   Nurse Notes:  Normal cognitive status assessed by direct observation by this Nurse Health Advisor. No abnormalities found.

## 2022-08-28 DIAGNOSIS — H52221 Regular astigmatism, right eye: Secondary | ICD-10-CM | POA: Diagnosis not present

## 2022-08-28 DIAGNOSIS — H524 Presbyopia: Secondary | ICD-10-CM | POA: Diagnosis not present

## 2022-09-03 ENCOUNTER — Other Ambulatory Visit: Payer: Self-pay | Admitting: Internal Medicine

## 2022-11-09 ENCOUNTER — Encounter (INDEPENDENT_AMBULATORY_CARE_PROVIDER_SITE_OTHER): Payer: Medicare HMO | Admitting: Ophthalmology

## 2022-11-17 ENCOUNTER — Encounter: Payer: Self-pay | Admitting: Internal Medicine

## 2022-11-17 ENCOUNTER — Ambulatory Visit (INDEPENDENT_AMBULATORY_CARE_PROVIDER_SITE_OTHER): Payer: Medicare HMO | Admitting: Internal Medicine

## 2022-11-17 VITALS — BP 140/80 | HR 67 | Temp 98.2°F | Ht 63.5 in | Wt 116.0 lb

## 2022-11-17 DIAGNOSIS — D320 Benign neoplasm of cerebral meninges: Secondary | ICD-10-CM | POA: Diagnosis not present

## 2022-11-17 DIAGNOSIS — J069 Acute upper respiratory infection, unspecified: Secondary | ICD-10-CM

## 2022-11-17 MED ORDER — PREDNISONE 20 MG PO TABS: 40.0000 mg | ORAL_TABLET | Freq: Every day | ORAL | 0 refills | Status: DC

## 2022-11-17 NOTE — Patient Instructions (Signed)
We will check the brain MRI in Nov 2024 to check the area.  We have sent in prednisone to take 2 pills daily for 5 days.

## 2022-11-17 NOTE — Assessment & Plan Note (Signed)
Recent MRI with slight growth in 10 years. We will plan for MRI Nov 2024 to ensure this is not changing rapidly.

## 2022-11-17 NOTE — Assessment & Plan Note (Signed)
Rx prednisone 5 day course and continue her promethazine/dm cough medicine which she still has at home. She will let us know in 3-4 days if this is not improving. Symptoms started 1 week ago and overall stable but not improving. No SOB but some cough.

## 2022-11-17 NOTE — Progress Notes (Signed)
   Subjective:   Patient ID: Amy Dunn, female    DOB: 04-08-1941, 82 y.o.   MRN: [id number]  Cough Associated symptoms include chills, myalgias, postnasal drip and rhinorrhea. Pertinent negatives include no ear pain, fever, sore throat, shortness of breath or wheezing.   The patient is an 82 YO female coming in for cough and cold symptoms going on about 1 week. Overall not improving. No SOB.   Review of Systems  Constitutional:  Positive for activity change, appetite change and chills. Negative for fatigue, fever and unexpected weight change.  HENT:  Positive for congestion, postnasal drip, rhinorrhea and sinus pressure. Negative for ear discharge, ear pain, sinus pain, sneezing, sore throat, tinnitus, trouble swallowing and voice change.   Eyes: Negative.   Respiratory:  Positive for cough. Negative for chest tightness, shortness of breath and wheezing.   Cardiovascular: Negative.   Gastrointestinal: Negative.   Musculoskeletal:  Positive for myalgias.  Neurological: Negative.     Objective:  Physical Exam Constitutional:      Appearance: She is well-developed.  HENT:     Head: Normocephalic and atraumatic.     Comments: Oropharynx with redness and clear drainage, nose with swollen turbinates, TMs normal bilaterally.  Neck:     Thyroid: No thyromegaly.  Cardiovascular:     Rate and Rhythm: Normal rate and regular rhythm.  Pulmonary:     Effort: Pulmonary effort is normal. No respiratory distress.     Breath sounds: Normal breath sounds. No wheezing or rales.  Abdominal:     Palpations: Abdomen is soft.  Musculoskeletal:        General: No tenderness.     Cervical back: Normal range of motion.  Lymphadenopathy:     Cervical: No cervical adenopathy.  Skin:    General: Skin is warm and dry.  Neurological:     Mental Status: She is alert and oriented to person, place, and time.     Vitals:   11/17/22 1100 11/17/22 1102  BP: (!) 140/80 (!) 140/80  Pulse: 67   Temp:  98.2 F (36.8 C)   TempSrc: Oral   SpO2: 98%   Weight: 116 lb (52.6 kg)   Height: 5' 3.5" (1.613 m)     Assessment & Plan:

## 2022-11-29 ENCOUNTER — Other Ambulatory Visit: Payer: Self-pay | Admitting: Internal Medicine

## 2022-12-11 ENCOUNTER — Other Ambulatory Visit: Payer: Self-pay | Admitting: Internal Medicine

## 2022-12-21 ENCOUNTER — Encounter (INDEPENDENT_AMBULATORY_CARE_PROVIDER_SITE_OTHER): Payer: Medicare HMO | Admitting: Ophthalmology

## 2023-01-05 ENCOUNTER — Encounter (INDEPENDENT_AMBULATORY_CARE_PROVIDER_SITE_OTHER): Payer: Medicare HMO | Admitting: Ophthalmology

## 2023-02-11 ENCOUNTER — Telehealth: Payer: Self-pay | Admitting: Internal Medicine

## 2023-02-11 NOTE — Telephone Encounter (Signed)
Prescription Request  02/11/2023  LOV: 11/17/2022  What is the name of the medication or equipment? tramadol  Have you contacted your pharmacy to request a refill? Yes   Which pharmacy would you like this sent to?  [id number] - [place], [place] - [address] [place] [address] Phone: [phone] Fax: [phone]     Patient notified that their request is being sent to the clinical staff for review and that they should receive a response within 2 business days.   Please advise at Mobile [phone] (mobile)

## 2023-02-15 MED ORDER — TRAMADOL HCL 50 MG PO TABS: 50.0000 mg | ORAL_TABLET | Freq: Two times a day (BID) | ORAL | 0 refills | Status: DC | PRN

## 2023-02-15 NOTE — Telephone Encounter (Signed)
Sent in

## 2023-02-25 ENCOUNTER — Other Ambulatory Visit: Payer: Self-pay | Admitting: Internal Medicine

## 2023-03-05 ENCOUNTER — Encounter: Payer: Self-pay | Admitting: Internal Medicine

## 2023-03-05 ENCOUNTER — Ambulatory Visit (INDEPENDENT_AMBULATORY_CARE_PROVIDER_SITE_OTHER): Payer: Medicare HMO | Admitting: Internal Medicine

## 2023-03-05 VITALS — BP 128/82 | HR 71 | Temp 97.9°F | Ht 63.5 in | Wt 112.0 lb

## 2023-03-05 DIAGNOSIS — M150 Primary generalized (osteo)arthritis: Secondary | ICD-10-CM | POA: Diagnosis not present

## 2023-03-05 DIAGNOSIS — Z23 Encounter for immunization: Secondary | ICD-10-CM | POA: Diagnosis not present

## 2023-03-05 MED ORDER — PREDNISONE 20 MG PO TABS: 40.0000 mg | ORAL_TABLET | Freq: Every day | ORAL | 0 refills | Status: AC

## 2023-03-05 NOTE — Patient Instructions (Signed)
We have sent in prednisone to take 2 pills daily for 5 days to help the arthritis.

## 2023-03-05 NOTE — Assessment & Plan Note (Signed)
With flare since recent covid-19 illness. Rx prednisone 5 day course to improve inflammation and pain. Can use tramadol and otc tylenol for pain without changing doses.

## 2023-03-05 NOTE — Progress Notes (Signed)
Subjective:   Patient ID: Amy Dunn, female    DOB: June 10, 1940, 82 y.o.   MRN: [id number]  HPI The patient is an 82 YO female coming in for swelling right arm and right hip pain. This seems to be happening since having covid a few weeks ago.  Review of Systems  Constitutional: Negative.   HENT: Negative.    Eyes: Negative.   Respiratory:  Negative for cough, chest tightness and shortness of breath.   Cardiovascular:  Negative for chest pain, palpitations and leg swelling.  Gastrointestinal:  Negative for abdominal distention, abdominal pain, constipation, diarrhea, nausea and vomiting.  Musculoskeletal:  Positive for arthralgias, joint swelling and myalgias.  Skin: Negative.   Neurological: Negative.   Psychiatric/Behavioral: Negative.      Objective:  Physical Exam Constitutional:      Appearance: She is well-developed.  HENT:     Head: Normocephalic and atraumatic.  Cardiovascular:     Rate and Rhythm: Normal rate and regular rhythm.  Pulmonary:     Effort: Pulmonary effort is normal. No respiratory distress.     Breath sounds: Normal breath sounds. No wheezing or rales.  Abdominal:     General: Bowel sounds are normal. There is no distension.     Palpations: Abdomen is soft.     Tenderness: There is no abdominal tenderness. There is no rebound.  Musculoskeletal:        General: Tenderness present.     Cervical back: Normal range of motion.     Comments: Swelling right thumb region and pain in the right 2,3,4 PIP region without joint swelling in PIPs  Skin:    General: Skin is warm and dry.  Neurological:     Mental Status: She is alert and oriented to person, place, and time.     Coordination: Coordination normal.     Vitals:   03/05/23 0913  BP: 128/82  Pulse: 71  Temp: 97.9 F (36.6 C)  TempSrc: Oral  SpO2: 98%  Weight: 112 lb (50.8 kg)  Height: 5' 3.5" (1.613 m)    Assessment & Plan:  Flu shot given at visit

## 2023-04-13 ENCOUNTER — Ambulatory Visit: Payer: Medicare HMO | Admitting: Internal Medicine

## 2023-04-13 ENCOUNTER — Encounter: Payer: Self-pay | Admitting: Internal Medicine

## 2023-04-13 VITALS — BP 118/72 | HR 90 | Temp 98.5°F | Ht 63.5 in | Wt 114.0 lb

## 2023-04-13 DIAGNOSIS — M150 Primary generalized (osteo)arthritis: Secondary | ICD-10-CM

## 2023-04-13 MED ORDER — METHYLPREDNISOLONE ACETATE 40 MG/ML IJ SUSP
40.0000 mg | Freq: Once | INTRAMUSCULAR | Status: AC
  Administered 2023-04-13: 40 mg via INTRAMUSCULAR

## 2023-04-13 MED ORDER — TRAMADOL HCL 50 MG PO TABS: 50.0000 mg | ORAL_TABLET | Freq: Two times a day (BID) | ORAL | 3 refills | Status: DC | PRN

## 2023-04-13 NOTE — Assessment & Plan Note (Signed)
Refilled her tramadol which she uses sparingly with tylenol. Given depo-medrol 40 mg IM today to see if this can get her out of a flare up.

## 2023-04-13 NOTE — Progress Notes (Signed)
   Subjective:   Patient ID: Amy Dunn, female    DOB: [birth year], 82 y.o.   MRN: [id number]  Arthritis Pertinent negatives include no diarrhea.   The patient is a 82 YO female coming in for worsening arthritis. Cold weather is hurting and out of her tramadol.   Review of Systems  Constitutional: Negative.   HENT: Negative.    Eyes: Negative.   Respiratory:  Negative for cough, chest tightness and shortness of breath.   Cardiovascular:  Negative for chest pain, palpitations and leg swelling.  Gastrointestinal:  Negative for abdominal distention, abdominal pain, constipation, diarrhea, nausea and vomiting.  Musculoskeletal:  Positive for arthralgias.  Skin: Negative.   Neurological: Negative.   Psychiatric/Behavioral: Negative.      Objective:  Physical Exam Constitutional:      Appearance: She is well-developed.  HENT:     Head: Normocephalic and atraumatic.  Cardiovascular:     Rate and Rhythm: Normal rate and regular rhythm.  Pulmonary:     Effort: Pulmonary effort is normal. No respiratory distress.     Breath sounds: Normal breath sounds. No wheezing or rales.  Abdominal:     General: Bowel sounds are normal. There is no distension.     Palpations: Abdomen is soft.     Tenderness: There is no abdominal tenderness. There is no rebound.  Musculoskeletal:        General: Tenderness present.     Cervical back: Normal range of motion.  Skin:    General: Skin is warm and dry.  Neurological:     Mental Status: She is alert and oriented to person, place, and time.     Coordination: Coordination normal.     Vitals:   04/13/23 1311  BP: 118/72  Pulse: 90  Temp: 98.5 F (36.9 C)  TempSrc: Oral  SpO2: 96%  Weight: 114 lb (51.7 kg)  Height: 5' 3.5" (1.613 m)    Assessment & Plan:  Depo-medrol 40 mg IM given at visit

## 2023-05-25 ENCOUNTER — Other Ambulatory Visit: Payer: Self-pay | Admitting: Internal Medicine

## 2023-08-09 ENCOUNTER — Ambulatory Visit (INDEPENDENT_AMBULATORY_CARE_PROVIDER_SITE_OTHER): Payer: Medicare HMO

## 2023-08-09 VITALS — Ht 65.0 in | Wt 114.0 lb

## 2023-08-09 DIAGNOSIS — Z0000 Encounter for general adult medical examination without abnormal findings: Secondary | ICD-10-CM | POA: Diagnosis not present

## 2023-08-09 NOTE — Progress Notes (Signed)
 Subjective:   Amy Dunn is a 83 y.o. who presents for a Medicare Wellness preventive visit.  Visit Complete: Virtual I connected with  Amy Dunn on 08/09/23 by a video and audio enabled telemedicine application and verified that I am speaking with the correct person using two identifiers.  Patient Location: Home  Provider Location: Office/Clinic  I discussed the limitations of evaluation and management by telemedicine. The patient expressed understanding and agreed to proceed.  Vital Signs: Because this visit was a virtual/telehealth visit, some criteria may be missing or patient reported. Any vitals not documented were not able to be obtained and vitals that have been documented are patient reported.  VideoDeclined- This patient declined Librarian, academic. Therefore the visit was completed with audio only.  AWV Questionnaire: No: Patient Medicare AWV questionnaire was not completed prior to this visit.  Cardiac Risk Factors include: advanced age (>72men, >2 women);hypertension     Objective:    Today's Vitals   08/09/23 1440  Weight: 114 lb (51.7 kg)  Height: 5\' 5"  (1.651 m)  PainSc: 8    Body mass index is 18.97 kg/m.     08/09/2023    2:38 PM 08/06/2022    3:23 PM 04/21/2021   11:33 AM 02/22/2020    9:07 AM 02/02/2019    9:55 AM 04/15/2016    9:47 AM 04/09/2015    9:16 AM  Advanced Directives  Does Patient Have a Medical Advance Directive? Yes No No No No No No  Type of Estate agent of [place];Living will        Copy of Healthcare Power of Attorney in Chart? No - copy requested        Would patient like information on creating a medical advance directive?  No - Patient declined  No - Patient declined Yes (ED - Information included in AVS) No - patient declined information No - patient declined information    Current Medications (verified) Outpatient Encounter Medications as of 08/09/2023  Medication Sig    albuterol (VENTOLIN HFA) 108 (90 Base) MCG/ACT inhaler Inhale 2 puffs into the lungs every 6 (six) hours as needed for wheezing or shortness of breath.   amLODipine (NORVASC) 5 MG tablet Take 1 tablet by mouth once daily   COMIRNATY syringe Inject 0.3 mLs into the muscle once.   Cyanocobalamin (VITAMIN B12 PO) Take 1 tablet by mouth daily.   diclofenac Sodium (VOLTAREN) 1 % GEL Apply 2 g topically 4 (four) times daily.   fexofenadine (ALLEGRA) 180 MG tablet Take 180 mg by mouth daily as needed. For allergies   fluticasone (FLONASE) 50 MCG/ACT nasal spray Place 2 sprays into both nostrils daily as needed for allergies or rhinitis.   hydrochlorothiazide (HYDRODIURIL) 25 MG tablet Take 1 tablet by mouth once daily   losartan (COZAAR) 100 MG tablet Take 1 tablet by mouth once daily   metoprolol succinate (TOPROL-XL) 50 MG 24 hr tablet TAKE 1 TABLET BY MOUTH DAILY WITH OR IMMEDIATELY FOLLOWING A MEAL   omeprazole (PRILOSEC) 40 MG capsule Take 1 capsule (40 mg total) by mouth daily.   promethazine-dextromethorphan (PROMETHAZINE-DM) 6.25-15 MG/5ML syrup Take 5 mLs by mouth 4 (four) times daily as needed for cough.   traMADol (ULTRAM) 50 MG tablet Take 1 tablet (50 mg total) by mouth every 12 (twelve) hours as needed.   [DISCONTINUED] neomycin-polymyxin-hydrocortisone (CORTISPORIN) 3.5-10000-1 OTIC suspension Place 3 drops into both ears 3 (three) times daily.   No facility-administered encounter medications  on file as of 08/09/2023.    Allergies (verified) Penicillins   History: Past Medical History:  Diagnosis Date   Acute peptic ulcer, unspecified site, with hemorrhage and perforation, without mention of obstruction    Cancer of left lung (HCC) 2015   Cough    GERD (gastroesophageal reflux disease)    Glaucoma    right eye   History of colonic polyps    Hypertension    Osteoarthritis of left shoulder    Primary osteoarthritis of left hip    Pulmonary nodule, left    Left Lower lobe    Stroke (HCC)    no residual   TIA (transient ischemic attack) 2011   transient blindness, speech- resolved   Past Surgical History:  Procedure Laterality Date   COLON SURGERY     EYE SURGERY Bilateral    cataracts   surgical repair of peptic ulcer     TUBAL LIGATION     VIDEO ASSISTED THORACOSCOPY (VATS)/WEDGE RESECTION Left 01/29/2014   Procedure: VIDEO ASSISTED THORACOSCOPY (VATS)/BASILAR SEGMENTECTOMY  LEFT LUNG LOWER LOBE WITH FROZEN SECTION, LEFT LOWER LOBECTOMY;  Surgeon: Loreli Slot, MD;  Location: [facility] OR;  Service: Thoracic;  Laterality: Left;   Family History  Problem Relation Age of Onset   Hypertension Mother    Heart disease Mother    Heart attack Mother    Cancer Father        Colon cancer   Social History   Socioeconomic History   Marital status: Married    Spouse name: Not on file   Number of children: 2   Years of education: Not on file   Highest education level: Not on file  Occupational History   Occupation: retired  Tobacco Use   Smoking status: Former    Types: Cigarettes    Start date: 1944    Passive exposure: Past   Smokeless tobacco: Never   Tobacco comments:    2 ciggs per day  Vaping Use   Vaping status: Never Used  Substance and Sexual Activity   Alcohol use: No   Drug use: No   Sexual activity: Not Currently  Other Topics Concern   Not on file  Social History Narrative   HSG   Married- '62   2 daughters- '65, '70; 3 grandchildren   Occupation retired- Actuary of life: No CPR, no intubation, no heroic measures (pt provided end of life packet)   Social Drivers of Corporate investment banker Strain: Low Risk  (08/09/2023)   Overall Financial Resource Strain (CARDIA)    Difficulty of Paying Living Expenses: Not hard at all  Food Insecurity: No Food Insecurity (08/09/2023)   Hunger Vital Sign    Worried About Running Out of Food in the Last Year: Never true    Ran Out of Food in the Last Year: Never true   Transportation Needs: No Transportation Needs (08/09/2023)   PRAPARE - Administrator, Civil Service (Medical): No    Lack of Transportation (Non-Medical): No  Physical Activity: Insufficiently Active (08/09/2023)   Exercise Vital Sign    Days of Exercise per Week: 3 days    Minutes of Exercise per Session: 20 min  Stress: No Stress Concern Present (08/09/2023)   [employer] of Occupational Health - Occupational Stress Questionnaire    Feeling of Stress : Not at all  Social Connections: Moderately Integrated (08/09/2023)   Social Connection and Isolation Panel [NHANES]    Frequency of  Communication with Friends and Family: More than three times a week    Frequency of Social Gatherings with Friends and Family: Once a week    Attends Religious Services: More than 4 times per year    Active Member of [employer] or Organizations: No    Attends Engineer, structural: Never    Marital Status: Married    Tobacco Counseling - Former Smoker Counseling given: Yes Tobacco comments: 2 ciggs per day    Clinical Intake:  Pre-visit preparation completed: Yes  Pain : 0-10 Pain Score: 8  Pain Type: Chronic pain (arthritis) Pain Location: Hand Pain Orientation: Right Pain Descriptors / Indicators: Constant, Cramping Pain Onset: 1 to 4 weeks ago Pain Frequency: Intermittent Pain Relieving Factors: taking Tramadol Effect of Pain on Daily Activities: assist some; using warm/hot packs  Pain Relieving Factors: taking Tramadol  BMI - recorded: 18.97 Nutritional Risks: None Diabetes: No  How often do you need to have someone help you when you read instructions, pamphlets, or other written materials from your doctor or pharmacy?: 1 - Never  Interpreter Needed?: No  Information entered by :: Hassell Halim, CMA   Activities of Daily Living     08/09/2023    2:47 PM  In your present state of health, do you have any difficulty performing the following activities:   Hearing? 0  Vision? 0  Difficulty concentrating or making decisions? 0  Walking or climbing stairs? 0  Dressing or bathing? 0  Doing errands, shopping? 0  Preparing Food and eating ? N  Using the Toilet? N  In the past six months, have you accidently leaked urine? N  Do you have problems with loss of bowel control? N  Managing your Medications? N  Managing your Finances? N  Housekeeping or managing your Housekeeping? N    Patient Care Team: Myrlene Broker, MD as PCP - General (Internal Medicine) Si Gaul, MD as Consulting Physician (Oncology) Ernesto Rutherford, MD as Consulting Physician (Ophthalmology) Sherrie George, MD as Consulting Physician (Ophthalmology)  Indicate any recent Medical Services you may have received from other than [facility] providers in the past year (date may be approximate).     Assessment:   This is a routine wellness examination for [facility].  Hearing/Vision screen Hearing Screening - Comments:: Wears bilateral hearing aids Vision Screening - Comments:: Wears rx glasses - up to date with routine eye exams with [facility]   Goals Addressed               This Visit's Progress     Patient Stated (pt-stated)        Patient stated she will continue exercising to help arthritis.       Depression Screen     08/09/2023    2:52 PM 11/17/2022   11:02 AM 08/06/2022    3:21 PM 02/11/2022    9:36 AM 10/16/2021    9:07 AM 07/25/2021    2:43 PM 07/25/2021    2:41 PM  PHQ 2/9 Scores  PHQ - 2 Score 0 0 0 0 0 0 0  PHQ- 9 Score 0  0 0 2      Fall Risk     08/09/2023    2:47 PM 04/13/2023    1:16 PM 11/17/2022   11:02 AM 08/06/2022    3:24 PM 02/11/2022    9:37 AM  Fall Risk   Falls in the past year? 0 0 0 0 0  Number falls in past yr: 0  0 0 0   Injury with Fall? 0 0 0 0 0  Risk for fall due to : No Fall Risks   No Fall Risks   Follow up Falls prevention discussed;Falls evaluation completed Falls evaluation completed Falls evaluation  completed Falls prevention discussed     MEDICARE RISK AT HOME:  Medicare Risk at Home Any stairs in or around the home?: Yes If so, are there any without handrails?: No Home free of loose throw rugs in walkways, pet beds, electrical cords, etc?: Yes Adequate lighting in your home to reduce risk of falls?: Yes Life alert?: No Use of a cane, walker or w/c?: No Grab bars in the bathroom?: No Shower chair or bench in shower?: No Elevated toilet seat or a handicapped toilet?: No  TIMED UP AND GO:  Was the test performed?  No  Cognitive Function: 6CIT completed        08/09/2023    2:48 PM 08/06/2022    3:25 PM 02/02/2019    9:57 AM  6CIT Screen  What Year? 0 points 0 points 0 points  What month? 0 points 0 points 0 points  What time? 0 points 0 points 0 points  Count back from 20 0 points 0 points 2 points  Months in reverse 0 points 0 points 2 points  Repeat phrase 2 points 0 points 2 points  Total Score 2 points 0 points 6 points    Immunizations Immunization History  Administered Date(s) Administered   Fluad Quad(high Dose 65+) 02/02/2019, 04/01/2021, 02/11/2022   Fluad Trivalent(High Dose 65+) 03/05/2023   Influenza Whole 04/16/2009, 02/17/2010, 03/02/2011   Influenza, High Dose Seasonal PF 03/14/2013, 03/10/2016, 03/05/2017, 03/30/2018   Influenza-Unspecified 03/19/2014, 01/23/2015   PFIZER(Purple Top)SARS-COV-2 Vaccination 07/15/2019, 08/09/2019   Pneumococcal Conjugate-13 04/01/2015   Pneumococcal Polysaccharide-23 02/17/2010   Td 03/20/2010    Screening Tests Health Maintenance  Topic Date Due   Zoster Vaccines- Shingrix (1 of 2) Never done   COVID-19 Vaccine (3 - Pfizer risk series) 09/06/2019   DTaP/Tdap/Td (2 - Tdap) 03/20/2020   Medicare Annual Wellness (AWV)  08/08/2024   Pneumonia Vaccine 74+ Years old  Completed   INFLUENZA VACCINE  Completed   DEXA SCAN  Addressed   HPV VACCINES  Aged Out    Health Maintenance  Health Maintenance Due  Topic  Date Due   Zoster Vaccines- Shingrix (1 of 2) Never done   COVID-19 Vaccine (3 - Pfizer risk series) 09/06/2019   DTaP/Tdap/Td (2 - Tdap) 03/20/2020   Health Maintenance Items Addressed: 08/09/2023   Additional Screening:  Vision Screening: Recommended annual ophthalmology exams for early detection of glaucoma and other disorders of the eye.  [facility] does annual eye exams.  Dental Screening: Recommended annual dental exams for proper oral hygiene  Community Resource Referral / Chronic Care Management: CRR required this visit?  No   CCM required this visit?  No     Plan:     I have personally reviewed and noted the following in the patient's chart:   Medical and social history Use of alcohol, tobacco or illicit drugs  Current medications and supplements including opioid prescriptions. Patient is currently taking opioid prescriptions. Information provided to patient regarding non-opioid alternatives. Patient advised to discuss non-opioid treatment plan with their provider. Functional ability and status Nutritional status Physical activity Advanced directives List of other physicians Hospitalizations, surgeries, and ER visits in previous 12 months Vitals Screenings to include cognitive, depression, and falls Referrals and appointments  In  addition, I have reviewed and discussed with patient certain preventive protocols, quality metrics, and best practice recommendations. A written personalized care plan for preventive services as well as general preventive health recommendations were provided to patient.     Darreld Mclean, CMA   08/09/2023   After Visit Summary: (MyChart) Due to this being a telephonic visit, the after visit summary with patients personalized plan was offered to patient via MyChart   Notes: Nothing significant to report at this time.

## 2023-08-09 NOTE — Patient Instructions (Signed)
 Ms. Steinhaus , Thank you for taking time to come for your Medicare Wellness Visit. I appreciate your ongoing commitment to your health goals. Please review the following plan we discussed and let me know if I can assist you in the future.   Referrals/Orders/Follow-Ups/Clinician Recommendations: Aim for 30 minutes of exercise or brisk walking, 6-8 glasses of water, and 5 servings of fruits and vegetables each day. Educated on getting the COVID and Shingles vaccines.    This is a list of the screening recommended for you and due dates:  Health Maintenance  Topic Date Due   Zoster (Shingles) Vaccine (1 of 2) Never done   COVID-19 Vaccine (3 - Pfizer risk series) 09/06/2019   DTaP/Tdap/Td vaccine (2 - Tdap) 03/20/2020   Medicare Annual Wellness Visit  08/08/2024   Pneumonia Vaccine  Completed   Flu Shot  Completed   DEXA scan (bone density measurement)  Addressed   HPV Vaccine  Aged Out    Advanced directives: (Copy Requested) Please bring a copy of your health care power of attorney and living will to the office to be added to your chart at your convenience. You can mail to [facility] [address] [place], [place] [address] or email to [email or web address]  Next Medicare Annual Wellness Visit scheduled for next year: Yes - 07/2024   Managing Pain Without Opioids Opioids are strong medicines used to treat moderate to severe pain. For some people, especially those who have long-term (chronic) pain, opioids may not be the best choice for pain management due to: Side effects like nausea, constipation, and sleepiness. The risk of addiction (opioid use disorder). The longer you take opioids, the greater your risk of addiction. Pain that lasts for more than 3 months is called chronic pain. Managing chronic pain usually requires more than one approach and is often provided by a team of health care providers working together (multidisciplinary approach). Pain management may be  done at a pain management center or pain clinic. How to manage pain without the use of opioids Use non-opioid medicines Non-opioid medicines for pain may include: Over-the-counter or prescription non-steroidal anti-inflammatory drugs (NSAIDs). These may be the first medicines used for pain. They work well for muscle and bone pain, and they reduce swelling. Acetaminophen. This over-the-counter medicine may work well for milder pain but not swelling. Antidepressants. These may be used to treat chronic pain. A certain type of antidepressant (tricyclics) is often used. These medicines are given in lower doses for pain than when used for depression. Anticonvulsants. These are usually used to treat seizures but may also reduce nerve (neuropathic) pain. Muscle relaxants. These relieve pain caused by sudden muscle tightening (spasms). You may also use a pain medicine that is applied to the skin as a patch, cream, or gel (topical analgesic), such as a numbing medicine. These may cause fewer side effects than medicines taken by mouth. Do certain therapies as directed Some therapies can help with pain management. They include: Physical therapy. You will do exercises to gain strength and flexibility. A physical therapist may teach you exercises to move and stretch parts of your body that are weak, stiff, or painful. You can learn these exercises at physical therapy visits and practice them at home. Physical therapy may also involve: Massage. Heat wraps or applying heat or cold to affected areas. Electrical signals that interrupt pain signals (transcutaneous electrical nerve stimulation, TENS). Weak lasers that reduce pain and swelling (low-level laser therapy). Signals from your body that  help you learn to regulate pain (biofeedback). Occupational therapy. This helps you to learn ways to function at home and work with less pain. Recreational therapy. This involves trying new activities or hobbies, such as a  physical activity or drawing. Mental health therapy, including: Cognitive behavioral therapy (CBT). This helps you learn coping skills for dealing with pain. Acceptance and commitment therapy (ACT) to change the way you think and react to pain. Relaxation therapies, including muscle relaxation exercises and mindfulness-based stress reduction. Pain management counseling. This may be individual, family, or group counseling.  Receive medical treatments Medical treatments for pain management include: Nerve block injections. These may include a pain blocker and anti-inflammatory medicines. You may have injections: Near the spine to relieve chronic back or neck pain. Into joints to relieve back or joint pain. Into nerve areas that supply a painful area to relieve body pain. Into muscles (trigger point injections) to relieve some painful muscle conditions. A medical device placed near your spine to help block pain signals and relieve nerve pain or chronic back pain (spinal cord stimulation device). Acupuncture. Follow these instructions at home Medicines Take over-the-counter and prescription medicines only as told by your health care provider. If you are taking pain medicine, ask your health care providers about possible side effects to watch out for. Do not drive or use heavy machinery while taking prescription opioid pain medicine. Lifestyle  Do not use drugs or alcohol to reduce pain. If you drink alcohol, limit how much you have to: 0-1 drink a day for women who are not pregnant. 0-2 drinks a day for men. Know how much alcohol is in a drink. In the [place] one drink equals one 12 oz bottle of beer (355 mL), one 5 oz glass of wine (148 mL), or one 1 oz glass of hard liquor (44 mL). Do not use any products that contain nicotine or tobacco. These products include cigarettes, chewing tobacco, and vaping devices, such as e-cigarettes. If you need help quitting, ask your health care provider. Eat  a healthy diet and maintain a healthy weight. Poor diet and excess weight may make pain worse. Eat foods that are high in fiber. These include fresh fruits and vegetables, whole grains, and beans. Limit foods that are high in fat and processed sugars, such as fried and sweet foods. Exercise regularly. Exercise lowers stress and may help relieve pain. Ask your health care provider what activities and exercises are safe for you. If your health care provider approves, join an exercise class that combines movement and stress reduction. Examples include yoga and tai chi. Get enough sleep. Lack of sleep may make pain worse. Lower stress as much as possible. Practice stress reduction techniques as told by your therapist. General instructions Work with all your pain management providers to find the treatments that work best for you. You are an important member of your pain management team. There are many things you can do to reduce pain on your own. Consider joining an online or in-person support group for people who have chronic pain. Keep all follow-up visits. This is important. Where to find more information You can find more information about managing pain without opioids from: American Academy of Pain Medicine: painmed.org Institute for Chronic Pain: instituteforchronicpain.org American Chronic Pain Association: theacpa.org Contact a health care provider if: You have side effects from pain medicine. Your pain gets worse or does not get better with treatments or home therapy. You are struggling with anxiety or depression. Summary Many types  of pain can be managed without opioids. Chronic pain may respond better to pain management without opioids. Pain is best managed when you and a team of health care providers work together. Pain management without opioids may include non-opioid medicines, medical treatments, physical therapy, mental health therapy, and lifestyle changes. Tell your health care  providers if your pain gets worse or is not being managed well enough. This information is not intended to replace advice given to you by your health care provider. Make sure you discuss any questions you have with your health care provider. Document Revised: 08/28/2020 Document Reviewed: 08/28/2020 Elsevier Patient Education  2024 [employer].

## 2023-08-23 ENCOUNTER — Ambulatory Visit (INDEPENDENT_AMBULATORY_CARE_PROVIDER_SITE_OTHER): Admitting: Internal Medicine

## 2023-08-23 VITALS — BP 110/78 | HR 88 | Temp 98.5°F | Ht 65.0 in | Wt 109.4 lb

## 2023-08-23 DIAGNOSIS — I10 Essential (primary) hypertension: Secondary | ICD-10-CM

## 2023-08-23 DIAGNOSIS — Z0000 Encounter for general adult medical examination without abnormal findings: Secondary | ICD-10-CM

## 2023-08-23 DIAGNOSIS — M150 Primary generalized (osteo)arthritis: Secondary | ICD-10-CM | POA: Diagnosis not present

## 2023-08-23 DIAGNOSIS — R053 Chronic cough: Secondary | ICD-10-CM | POA: Diagnosis not present

## 2023-08-23 DIAGNOSIS — C3492 Malignant neoplasm of unspecified part of left bronchus or lung: Secondary | ICD-10-CM | POA: Diagnosis not present

## 2023-08-23 DIAGNOSIS — D320 Benign neoplasm of cerebral meninges: Secondary | ICD-10-CM

## 2023-08-23 LAB — COMPREHENSIVE METABOLIC PANEL
ALT: 6 U/L (ref 0–35)
AST: 15 U/L (ref 0–37)
Albumin: 4.3 g/dL (ref 3.5–5.2)
Alkaline Phosphatase: 85 U/L (ref 39–117)
BUN: 17 mg/dL (ref 6–23)
CO2: 31 meq/L (ref 19–32)
Calcium: 9.9 mg/dL (ref 8.4–10.5)
Chloride: 99 meq/L (ref 96–112)
Creatinine, Ser: 1.4 mg/dL — ABNORMAL HIGH (ref 0.40–1.20)
GFR: 34.46 mL/min — ABNORMAL LOW (ref >60.00)
Glucose, Bld: 114 mg/dL — ABNORMAL HIGH (ref 70–99)
Potassium: 3.8 meq/L (ref 3.5–5.1)
Sodium: 137 meq/L (ref 135–145)
Total Bilirubin: 0.6 mg/dL (ref 0.2–1.2)
Total Protein: 7.8 g/dL (ref 6.0–8.3)

## 2023-08-23 LAB — CBC
HCT: 32.2 % — ABNORMAL LOW (ref 36.0–46.0)
Hemoglobin: 11.1 g/dL — ABNORMAL LOW (ref 12.0–15.0)
MCHC: 34.6 g/dL (ref 30.0–36.0)
MCV: 78.3 fl (ref 78.0–100.0)
Platelets: 229 10*3/uL (ref 150.0–400.0)
RBC: 4.11 Mil/uL (ref 3.87–5.11)
RDW: 15.1 % (ref 11.5–15.5)
WBC: 6.7 10*3/uL (ref 4.0–10.5)

## 2023-08-23 LAB — LIPID PANEL
Cholesterol: 186 mg/dL (ref 0–200)
HDL: 49.9 mg/dL (ref >39.00)
LDL Cholesterol: 113 mg/dL — ABNORMAL HIGH (ref 0–99)
NonHDL: 136.46
Total CHOL/HDL Ratio: 4
Triglycerides: 115 mg/dL (ref 0.0–149.0)
VLDL: 23 mg/dL (ref 0.0–40.0)

## 2023-08-23 MED ORDER — HYDROCODONE BIT-HOMATROP MBR 5-1.5 MG/5ML PO SOLN: 5.0000 mL | Freq: Three times a day (TID) | ORAL | 0 refills | Status: DC | PRN

## 2023-08-23 MED ORDER — TRAMADOL HCL 50 MG PO TABS: 50.0000 mg | ORAL_TABLET | Freq: Two times a day (BID) | ORAL | 3 refills | Status: DC | PRN

## 2023-08-23 NOTE — Patient Instructions (Signed)
 We have sent in the stronger cough medicine.   We will check the labs.

## 2023-08-23 NOTE — Progress Notes (Unsigned)
   Subjective:   Patient ID: Amy Dunn, female    DOB: [birth year], 83 y.o.   MRN: [id number]  HPI The patient is here for physical.  PMH, [facility], social history reviewed and updated  Review of Systems  Constitutional: Negative.   HENT: Negative.    Eyes: Negative.   Respiratory:  Positive for cough. Negative for chest tightness and shortness of breath.   Cardiovascular:  Negative for chest pain, palpitations and leg swelling.  Gastrointestinal:  Negative for abdominal distention, abdominal pain, constipation, diarrhea, nausea and vomiting.  Musculoskeletal: Negative.   Skin: Negative.   Neurological: Negative.   Psychiatric/Behavioral: Negative.      Objective:  Physical Exam Constitutional:      Appearance: She is well-developed.  HENT:     Head: Normocephalic and atraumatic.  Cardiovascular:     Rate and Rhythm: Normal rate and regular rhythm.  Pulmonary:     Effort: Pulmonary effort is normal. No respiratory distress.     Breath sounds: Normal breath sounds. No wheezing or rales.  Abdominal:     General: Bowel sounds are normal. There is no distension.     Palpations: Abdomen is soft.     Tenderness: There is no abdominal tenderness. There is no rebound.  Musculoskeletal:     Cervical back: Normal range of motion.  Skin:    General: Skin is warm and dry.  Neurological:     Mental Status: She is alert and oriented to person, place, and time.     Coordination: Coordination normal.     Vitals:   08/23/23 1332  BP: 110/78  Pulse: 88  Temp: 98.5 F (36.9 C)  TempSrc: Temporal  SpO2: 97%  Weight: 109 lb 6 oz (49.6 kg)  Height: 5\' 5"  (1.651 m)    Assessment & Plan:

## 2023-08-24 ENCOUNTER — Telehealth: Payer: Self-pay | Admitting: Internal Medicine

## 2023-08-24 ENCOUNTER — Encounter: Payer: Self-pay | Admitting: Internal Medicine

## 2023-08-24 NOTE — Assessment & Plan Note (Signed)
 Using tramadol BID for this and stable overall. Continue and PDMP reviewed and appropriate.

## 2023-08-24 NOTE — Assessment & Plan Note (Signed)
 BP at goal on amlodipine 5 mg dialy and hydrochlorothiazide 25 mg daily and losartan 100 mg daily. Checking CMP and CBC and adjust as needed.

## 2023-08-24 NOTE — Telephone Encounter (Unsigned)
 Copied from CRM [id number]. Topic: Clinical - Medication Question >> Aug 24, 2023  9:35 AM Mackie Pai E wrote: Reason for CRM: Corrie Dandy from the [facility] called regarding the patient's traMADol (ULTRAM) 50 MG tablet medication. Makalia stated that they need to know the pain condition that is being treated for the patient. Callback number for Lad[facility] is [phone] to discuss.

## 2023-08-24 NOTE — Assessment & Plan Note (Signed)
 Flu shot up to date. Pneumonia complete. Shingrix due at pharmacy. Tetanus due at pharmacy. Colonoscopy aged out. Mammogram aged out, pap smear aged out and dexa complete. Counseled about sun safety and mole surveillance. Counseled about the dangers of distracted driving. Given 10 year screening recommendations.

## 2023-08-24 NOTE — Assessment & Plan Note (Signed)
 No new symptoms and has been stable for some time. If any new symptoms repeat imaging warranted.

## 2023-08-24 NOTE — Assessment & Plan Note (Signed)
 Stable SOB and pain at site of VATs still undergoing surveillance imaging.

## 2023-08-24 NOTE — Assessment & Plan Note (Signed)
 Uses promethazine/dm cough syrup but this is not helping and had flu about 2-3 weeks ago. Rx hycodan cough syrup to use rarely.

## 2023-08-26 ENCOUNTER — Other Ambulatory Visit: Payer: Self-pay

## 2023-08-26 ENCOUNTER — Other Ambulatory Visit: Payer: Self-pay | Admitting: Internal Medicine

## 2023-08-26 NOTE — Telephone Encounter (Signed)
This is chronic.  ?

## 2023-08-26 NOTE — Telephone Encounter (Signed)
 Is this acute or chronic for the arthritis. If acute they can only refill it for 5 days and not 30. Only can fill for 30 days if it is chronic. Per pharmacy request.

## 2023-08-27 NOTE — Telephone Encounter (Signed)
 Copied from CRM [id number]. Topic: Clinical - Medication Question >> Aug 27, 2023  8:47 AM Adaysia C wrote: Reason for CRM: Patients pharmacy called and needed to know the pain diagnoses for [id number] (ULTRAM) 50 MG tablet; informed pharmacist the pain diagnoses is chronic arthritis as found in the CRM messages between CMA Micaiah, C and Dr. Okey Dupre

## 2023-09-06 NOTE — Telephone Encounter (Signed)
 Called pharmacy back and relayed back the message

## 2023-10-06 ENCOUNTER — Encounter: Payer: Self-pay | Admitting: Family Medicine

## 2023-10-06 ENCOUNTER — Ambulatory Visit (INDEPENDENT_AMBULATORY_CARE_PROVIDER_SITE_OTHER): Admitting: Family Medicine

## 2023-10-06 VITALS — BP 138/92 | HR 77 | Temp 98.4°F | Ht 65.0 in | Wt 109.8 lb

## 2023-10-06 DIAGNOSIS — M5441 Lumbago with sciatica, right side: Secondary | ICD-10-CM | POA: Diagnosis not present

## 2023-10-06 DIAGNOSIS — R634 Abnormal weight loss: Secondary | ICD-10-CM | POA: Diagnosis not present

## 2023-10-06 DIAGNOSIS — K089 Disorder of teeth and supporting structures, unspecified: Secondary | ICD-10-CM | POA: Diagnosis not present

## 2023-10-06 MED ORDER — PREDNISONE 20 MG PO TABS: 20.0000 mg | ORAL_TABLET | Freq: Every day | ORAL | 0 refills | Status: AC

## 2023-10-06 MED ORDER — TIZANIDINE HCL 2 MG PO TABS: 2.0000 mg | ORAL_TABLET | Freq: Two times a day (BID) | ORAL | 0 refills | Status: DC | PRN

## 2023-10-06 NOTE — Assessment & Plan Note (Signed)
 Handout given with higher calorie, higher protein foods Discussed that steroid may also help stimulate appetite

## 2023-10-06 NOTE — Progress Notes (Signed)
 Acute Office Visit  Subjective:     Patient ID: Amy Dunn, female    DOB: Nov 15, 1940, 83 y.o.   MRN: [id number]  Chief Complaint  Patient presents with   Acute Visit    Right hip, ongoing for about a week. Tramadol  does not touch the pain. Has been using heat on it    HPI Patient is in today for evaluation right low back pain for the last week. Has been taking tramadol  not helping at all.  Pain is worse with weight bearing and increased activity, better with lying down. Reports radiating pain down the outer right leg, with some mild tingling at times.  Denies loss of strength in extremity.  Reports that she has knot of muscle in her right low back and thinks this is where all of this is coming from. Denies known injury. Denies swelling, injury, radiating pain, other symptoms.   Reports that she has been losing weight. States that she is going to have some teeth pulled and thinks that she is not eating as well as she should. States she cannot tolerate Ensure or protein shakes due to IBS. Inquiring about what else she can do today. Denies other concerns today  ROS Per HPI      Objective:    BP (!) 138/92 (BP Location: Left Arm, Patient Position: Sitting)   Pulse 77   Temp 98.4 F (36.9 C) (Temporal)   Ht 5\' 5"  (1.651 m)   Wt 109 lb 12.8 oz (49.8 kg)   SpO2 95%   BMI 18.27 kg/m    Physical Exam Vitals and nursing note reviewed.  Constitutional:      General: She is not in acute distress.    Comments: Underweight, elderly  HENT:     Head: Normocephalic and atraumatic.     Nose: Nose normal.  Eyes:     Extraocular Movements: Extraocular movements intact.  Cardiovascular:     Rate and Rhythm: Normal rate.     Pulses: Normal pulses.  Pulmonary:     Effort: Pulmonary effort is normal.  Musculoskeletal:        General: Swelling and tenderness present.     Cervical back: Normal range of motion.       Back:     Right lower leg: No edema.     Left lower leg:  No edema.     Comments: Area of mildly swollen, mildly tender, muscular spasm.  When palpated, pain is reproducible down the right leg.  Unable to tolerate straight leg raise exam  Lymphadenopathy:     Cervical: No cervical adenopathy.  Neurological:     General: No focal deficit present.     Mental Status: She is alert and oriented to person, place, and time.  Psychiatric:        Mood and Affect: Mood normal.        Thought Content: Thought content normal.     No results found for any visits on 10/06/23.      Assessment & Plan:   Acute right-sided low back pain with right-sided sciatica Assessment & Plan: Prednisone  20mg  every day x 5 days Tizanidine 2mg  BID prn spasms Stretching handouts given   Orders: -     predniSONE ; Take 1 tablet (20 mg total) by mouth daily with breakfast for 5 days.  Dispense: 5 tablet; Refill: 0 -     tiZANidine HCl; Take 1 tablet (2 mg total) by mouth 2 (two) times daily as needed for muscle  spasms (as needed).  Dispense: 30 tablet; Refill: 0  Weight loss Assessment & Plan: Handout given with higher calorie, higher protein foods Discussed that steroid may also help stimulate appetite  Orders: -     predniSONE ; Take 1 tablet (20 mg total) by mouth daily with breakfast for 5 days.  Dispense: 5 tablet; Refill: 0  Poor dentition Assessment & Plan: Stable, under dental tx      Meds ordered this encounter  Medications   predniSONE  (DELTASONE ) 20 MG tablet    Sig: Take 1 tablet (20 mg total) by mouth daily with breakfast for 5 days.    Dispense:  5 tablet    Refill:  0   tiZANidine (ZANAFLEX) 2 MG tablet    Sig: Take 1 tablet (2 mg total) by mouth 2 (two) times daily as needed for muscle spasms (as needed).    Dispense:  30 tablet    Refill:  0    Return if symptoms worsen or fail to improve.  Wellington Half, FNP

## 2023-10-06 NOTE — Assessment & Plan Note (Signed)
 Prednisone  20mg  every day x 5 days Tizanidine 2mg  BID prn spasms Stretching handouts given

## 2023-10-06 NOTE — Patient Instructions (Addendum)
 I have attached stretches for you to do to help with your back pain.  I have also attached a handout for you that has higher calorie and higher protein foods to help with your weight.   I have sent in prednisone  for you to take one tablet in the morning for 5 days. This will also stimulate your appetite and hopefully help with your weight for now.   May add flax seed to your food to help get in some more fiber and calories in your diet.   I have sent in a muscle relaxer for you to use one tablet as needed for muscle spasms. This medication can make you sleepy. Do not drive until you know how this medication affects you.  May use heat or ice to the area for relief as needed.  May use topical rubs or gels to the area as needed for relief.  Attached subtle stretching exercises to help relieve pain and strengthen muscles.   Follow-up with me for new or worsening symptoms.

## 2023-10-06 NOTE — Assessment & Plan Note (Signed)
 Stable, under dental tx

## 2023-10-29 ENCOUNTER — Ambulatory Visit (INDEPENDENT_AMBULATORY_CARE_PROVIDER_SITE_OTHER)

## 2023-10-29 ENCOUNTER — Encounter: Payer: Self-pay | Admitting: Internal Medicine

## 2023-10-29 ENCOUNTER — Ambulatory Visit (INDEPENDENT_AMBULATORY_CARE_PROVIDER_SITE_OTHER): Admitting: Internal Medicine

## 2023-10-29 VITALS — BP 120/80 | HR 73 | Temp 98.6°F | Ht 65.0 in | Wt 112.0 lb

## 2023-10-29 DIAGNOSIS — M47816 Spondylosis without myelopathy or radiculopathy, lumbar region: Secondary | ICD-10-CM | POA: Diagnosis not present

## 2023-10-29 DIAGNOSIS — M25551 Pain in right hip: Secondary | ICD-10-CM | POA: Diagnosis not present

## 2023-10-29 DIAGNOSIS — M5441 Lumbago with sciatica, right side: Secondary | ICD-10-CM

## 2023-10-29 DIAGNOSIS — M545 Low back pain, unspecified: Secondary | ICD-10-CM | POA: Diagnosis not present

## 2023-10-29 MED ORDER — CYCLOBENZAPRINE HCL 5 MG PO TABS: 5.0000 mg | ORAL_TABLET | Freq: Three times a day (TID) | ORAL | 1 refills | Status: AC | PRN

## 2023-10-29 MED ORDER — PREDNISONE 20 MG PO TABS: 40.0000 mg | ORAL_TABLET | Freq: Every day | ORAL | 0 refills | Status: AC

## 2023-10-29 NOTE — Progress Notes (Signed)
   Subjective:   Patient ID: Amy Dunn, female    DOB: 1941/02/15, 83 y.o.   MRN: [id number]  HPI The patient is an 83 YO female coming in for ongoing low back and right hip pain. Started after long road trip and has continued since without relief. Worse with standing or sitting for long. Relief temporary with heat.   Review of Systems  Constitutional: Negative.   HENT: Negative.    Eyes: Negative.   Respiratory:  Negative for cough, chest tightness and shortness of breath.   Cardiovascular:  Negative for chest pain, palpitations and leg swelling.  Gastrointestinal:  Negative for abdominal distention, abdominal pain, constipation, diarrhea, nausea and vomiting.  Musculoskeletal:  Positive for arthralgias, back pain and myalgias.  Skin: Negative.   Neurological:  Positive for numbness.  Psychiatric/Behavioral: Negative.      Objective:  Physical Exam Constitutional:      Appearance: She is well-developed.  HENT:     Head: Normocephalic and atraumatic.  Cardiovascular:     Rate and Rhythm: Normal rate and regular rhythm.  Pulmonary:     Effort: Pulmonary effort is normal. No respiratory distress.     Breath sounds: Normal breath sounds. No wheezing or rales.  Abdominal:     General: Bowel sounds are normal. There is no distension.     Palpations: Abdomen is soft.     Tenderness: There is no abdominal tenderness. There is no rebound.  Musculoskeletal:        General: Tenderness present.     Cervical back: Normal range of motion.     Comments: Pain right trochanter region and low back right sided  Skin:    General: Skin is warm and dry.  Neurological:     Mental Status: She is alert and oriented to person, place, and time.     Coordination: Coordination normal.     Vitals:   10/29/23 0749  BP: 120/80  Pulse: 73  Temp: 98.6 F (37 C)  TempSrc: Oral  SpO2: 97%  Weight: 112 lb (50.8 kg)  Height: 5\' 5"  (1.651 m)    Assessment & Plan:

## 2023-10-29 NOTE — Patient Instructions (Signed)
 We have sent in the prednisone  to take 2 pills daily for 1 week. We have also sent in a different muscle relaxer called flexeril to use up to 3 times a day for the pain.  We will check the x-ray today to check the hip.

## 2023-10-29 NOTE — Assessment & Plan Note (Signed)
 Prednisone  did help will do 1 week course rx done. Will switch muscle relaxers to flexeril  to see if this helps. Given time course and radiation with some tingling numbness sensation checking x-ray low back and hip. She does have history of lung cancer.

## 2023-11-08 ENCOUNTER — Ambulatory Visit: Payer: Self-pay | Admitting: Internal Medicine

## 2023-11-09 ENCOUNTER — Other Ambulatory Visit: Payer: Self-pay | Admitting: Internal Medicine

## 2023-11-09 DIAGNOSIS — M5441 Lumbago with sciatica, right side: Secondary | ICD-10-CM

## 2023-11-10 ENCOUNTER — Ambulatory Visit: Payer: Self-pay

## 2023-11-10 NOTE — Telephone Encounter (Signed)
 FYI Only or Action Required?: FYI only for provider  Patient was last seen in primary care on 10/29/2023 by Adelia Homestead, MD. Called Nurse Triage reporting Hip Pain. Symptoms began several weeks ago. Interventions attempted: Other: Pain medication and advil . Symptoms are: gradually worsening.  Triage Disposition: See PCP Within 2 Weeks  Patient/caregiver understands and will follow disposition?: Yes       Copied from CRM [id number]. Topic: Clinical - Red Word Triage >> Nov 10, 2023 10:32 AM Kita Perish H wrote: [facility] that prompted transfer to Nurse Triage: Arthritis pain in hip, was in last week to see Dr. Nicolette Barrio and pain getting worse, pain shoots down to legs and hurts to walk Reason for Disposition  Hip pain is a chronic symptom (recurrent or ongoing AND present > 4 weeks)  Additional Information  Commented on: All Negative - See PCP Within 2 Weeks    Pain worsening over past 2 weeks.  Answer Assessment - Initial Assessment Questions 1. LOCATION and RADIATION: Where is the pain located?      Left hip  2. QUALITY: What does the pain feel like?  (e.g., sharp, dull, aching, burning)     Stabbing   3. SEVERITY: How bad is the pain? What does it keep you from doing?   (Scale 1-10; or mild, moderate, severe)   -  MILD (1-3): doesn't interfere with normal activities    -  MODERATE (4-7): interferes with normal activities (e.g., work or school) or awakens from sleep, limping    -  SEVERE (8-10): excruciating pain, unable to do any normal activities, unable to walk     8/10  4. ONSET: When did the pain start? Does it come and go, or is it there all the time?     Constant for over 2 weeks  5. WORK OR EXERCISE: Has there been any recent work or exercise that involved this part of the body?      No  6. CAUSE: What do you think is causing the hip pain?      Likely arthritis  7. AGGRAVATING FACTORS: What makes the hip pain worse? (e.g., walking, climbing  stairs, running)     Worse when sitting, standing, and walking  8. OTHER SYMPTOMS: Do you have any other symptoms? (e.g., back pain, pain shooting down leg,  fever, rash)     Pain shoot down to foot   Pain x several weeks, seen on 5/30 and started on 7-day steroid. Says that the pain got better then but not completely gone. Pt says she has found some relief with Advil , but had to stopped taking as many due to GI issues. Pt is aware of referral for Physical Therapy  Protocols used: Hip Pain-A-AH

## 2023-11-11 ENCOUNTER — Ambulatory Visit: Admitting: Internal Medicine

## 2023-11-11 ENCOUNTER — Encounter: Payer: Self-pay | Admitting: Internal Medicine

## 2023-11-11 VITALS — BP 116/80 | HR 84 | Temp 99.0°F | Ht 65.0 in | Wt 111.0 lb

## 2023-11-11 DIAGNOSIS — G5701 Lesion of sciatic nerve, right lower limb: Secondary | ICD-10-CM

## 2023-11-11 DIAGNOSIS — M7060 Trochanteric bursitis, unspecified hip: Secondary | ICD-10-CM | POA: Insufficient documentation

## 2023-11-11 DIAGNOSIS — M7061 Trochanteric bursitis, right hip: Secondary | ICD-10-CM | POA: Diagnosis not present

## 2023-11-11 MED ORDER — HYDROCODONE-ACETAMINOPHEN 5-325 MG PO TABS: 1.0000 | ORAL_TABLET | Freq: Four times a day (QID) | ORAL | 0 refills | Status: DC | PRN

## 2023-11-11 MED ORDER — METHYLPREDNISOLONE 4 MG PO TBPK: ORAL_TABLET | ORAL | 0 refills | Status: DC

## 2023-11-11 NOTE — Patient Instructions (Signed)
 USEFUL THINGS FOR ARTHRITIS and musculoskeletal pains:    A "rice sock heating pad" refers to a homemade heating pad created by filling a sock with uncooked rice, which can be heated in a microwave to provide a warm compress for sore muscles, pain relief, or other applications; essentially, it's a simple way to generate heat using readily available materials.  Key points about rice sock heat: How to make it: Fill a clean sock (preferably a tube sock) about 2/3 full with uncooked rice, tie a knot at the top to secure the rice inside.  Heating it up: Place the rice sock in the microwave and heat in short intervals (usually around 30 seconds at a time) until it reaches the desired warmth.  Important considerations: Check temperature before applying: Always test the temperature of the rice sock before applying it to your skin to avoid burns.  Use a towel to protect skin: Wrap the rice sock in a thin towel to distribute the heat evenly and protect your skin.  Uses: Muscle aches and pains  Menstrual cramps  Neck pain  Arthritis discomfort      BLUE EMU CREAM: Use it 2-3 times a day on painful areas

## 2023-11-11 NOTE — Progress Notes (Signed)
 Subjective:  Patient ID: Amy Dunn, female    DOB: 1940/06/11  Age: 83 y.o. MRN: [id number]  CC: Acute Visit (Right pain x2weeks Pt states walking and stand up is very painful she starts therapy for the hip at the end of the month....wants to discuss solutions for pain relief.)   HPI Amy Dunn presents for pain shooting down to the foot x 2 weeks Pain is severe 8/10. It started after a road trip.  Amy Dunn made a 4-hour car trip to and from a conference in 1 day.  She was sitting at the conference and standing for hours.  The pain started on the way back home.  The patient is located in the right buttock, radiating down to the right thigh.  She also having pain in the right lateral hip.  She tried over-the-counter medicines.  Nothing helped.  No skin rash  Outpatient Medications Prior to Visit  Medication Sig Dispense Refill   albuterol  (VENTOLIN  HFA) 108 (90 Base) MCG/ACT inhaler Inhale 2 puffs into the lungs every 6 (six) hours as needed for wheezing or shortness of breath. 8 g 2   amLODipine  (NORVASC ) 5 MG tablet Take 1 tablet by mouth once daily 90 tablet 0   Cyanocobalamin (VITAMIN B12 PO) Take 1 tablet by mouth daily.     cyclobenzaprine  (FLEXERIL ) 5 MG tablet Take 1 tablet (5 mg total) by mouth 3 (three) times daily as needed for muscle spasms. 30 tablet 1   diclofenac  Sodium (VOLTAREN ) 1 % GEL Apply 2 g topically 4 (four) times daily. 100 g 3   fexofenadine (ALLEGRA) 180 MG tablet Take 180 mg by mouth daily as needed. For allergies     fluticasone  (FLONASE ) 50 MCG/ACT nasal spray Place 2 sprays into both nostrils daily as needed for allergies or rhinitis.     hydrochlorothiazide  (HYDRODIURIL ) 25 MG tablet Take 1 tablet by mouth once daily 90 tablet 0   losartan  (COZAAR ) 100 MG tablet Take 1 tablet by mouth once daily 90 tablet 0   omeprazole  (PRILOSEC) 40 MG capsule Take 1 capsule (40 mg total) by mouth daily. 90 capsule 3   traMADol  (ULTRAM ) 50 MG tablet Take 1 tablet (50 mg  total) by mouth every 12 (twelve) hours as needed. 60 tablet 3   No facility-administered medications prior to visit.    ROS: Review of Systems  Constitutional:  Negative for activity change, appetite change, chills, fatigue and unexpected weight change.  HENT:  Negative for congestion, mouth sores and sinus pressure.   Eyes:  Negative for visual disturbance.  Respiratory:  Negative for cough and chest tightness.   Gastrointestinal:  Negative for abdominal pain and nausea.  Genitourinary:  Negative for difficulty urinating, frequency and vaginal pain.  Musculoskeletal:  Positive for arthralgias and gait problem. Negative for back pain.  Skin:  Negative for pallor and rash.  Neurological:  Negative for dizziness, tremors, weakness, numbness and headaches.  Psychiatric/Behavioral:  Negative for confusion, dysphoric mood and sleep disturbance.     Objective:  BP 116/80   Pulse 84   Temp 99 F (37.2 C) (Oral)   Ht 5' 5 (1.651 m)   Wt 111 lb (50.3 kg)   BMI 18.47 kg/m   BP Readings from Last 3 Encounters:  11/11/23 116/80  10/29/23 120/80  10/06/23 (!) 138/92    Wt Readings from Last 3 Encounters:  11/11/23 111 lb (50.3 kg)  10/29/23 112 lb (50.8 kg)  10/06/23 109 lb 12.8 oz (49.8  kg)    Physical Exam Constitutional:      General: She is not in acute distress.    Appearance: She is well-developed. She is not toxic-appearing.  HENT:     Head: Normocephalic.     Right Ear: External ear normal.     Left Ear: External ear normal.     Nose: Nose normal.   Eyes:     General:        Right eye: No discharge.        Left eye: No discharge.     Conjunctiva/sclera: Conjunctivae normal.     Pupils: Pupils are equal, round, and reactive to light.   Neck:     Thyroid: No thyromegaly.     Vascular: No JVD.     Trachea: No tracheal deviation.   Cardiovascular:     Rate and Rhythm: Normal rate and regular rhythm.     Heart sounds: Normal heart sounds.  Pulmonary:      Effort: No respiratory distress.     Breath sounds: No stridor. No wheezing.  Abdominal:     General: Bowel sounds are normal. There is no distension.     Palpations: Abdomen is soft. There is no mass.     Tenderness: There is no abdominal tenderness. There is no guarding or rebound.   Musculoskeletal:        General: No tenderness.     Cervical back: Normal range of motion and neck supple. No rigidity.     Right lower leg: No edema.     Left lower leg: No edema.  Lymphadenopathy:     Cervical: No cervical adenopathy.   Skin:    Findings: No erythema or rash.   Neurological:     Cranial Nerves: No cranial nerve deficit.     Motor: No abnormal muscle tone.     Coordination: Coordination normal.     Deep Tendon Reflexes: Reflexes normal.   Psychiatric:        Behavior: Behavior normal.        Thought Content: Thought content normal.        Judgment: Judgment normal.   Straight leg elevation is negative bilaterally.  Right lateral hip with severe pain on palpation.  Central right buttock is very tender to deep palpation. Deep tendon reflexes, muscle strength-intact  Lab Results  Component Value Date   WBC 6.7 08/23/2023   HGB 11.1 (L) 08/23/2023   HCT 32.2 (L) 08/23/2023   PLT 229.0 08/23/2023   GLUCOSE 114 (H) 08/23/2023   CHOL 186 08/23/2023   TRIG 115.0 08/23/2023   HDL 49.90 08/23/2023   LDLDIRECT 102.0 10/28/2017   LDLCALC 113 (H) 08/23/2023   ALT 6 08/23/2023   AST 15 08/23/2023   NA 137 08/23/2023   K 3.8 08/23/2023   CL 99 08/23/2023   CREATININE 1.40 (H) 08/23/2023   BUN 17 08/23/2023   CO2 31 08/23/2023   TSH 0.66 10/22/2011   INR 0.97 01/25/2014   HGBA1C 5.7 (H) 01/17/2012    MR BRAIN/IAC W WO CONTRAST Result Date: 04/14/2022 CLINICAL DATA:  Tinnitus of right ear. Asymmetrical sensorineural hearing loss. EXAM: MRI HEAD WITHOUT AND WITH CONTRAST TECHNIQUE: Multiplanar, multiecho pulse sequences of the brain and surrounding structures were obtained  without and with intravenous contrast. COMPARISON:  MRI of the brain January 18, 2012. FINDINGS: Brain: No acute infarction, hemorrhage, hydrocephalus or extra-axial collection. Again seen is a dural-based, extra-axial lesion in the right parietal convexity with prominent associated susceptibility artifact  and contrast, measuring approximately 3.6 x 3.3 x 3.0 cm, most consistent with a calcified meningioma. This lesion has mildly increased in size when compared to MRI performed 2013 when it measured approximately 3.2 x 3.1 x 2.8 cm. There is mass effect on the adjacent brain parenchyma without evidence of invasion or edema. Extensive confluent T2 hyperintensity involving the white matter of the cerebral hemispheres has not significantly changed when compared to prior MRI. Dedicated imaging through the internal auditory canals demonstrates a normal course of cranial nerves VII and VIII without evidence of a mass or abnormal enhancement. The inner ear structures demonstrate normal signal and morphology bilaterally. No mass is present in the cerebellopontine angles. Vascular: Normal flow voids. Skull and upper cervical spine: Degenerative changes of the cervical spine. No focal marrow lesion identified. Sinuses/Orbits: Negative. Other: None. IMPRESSION: 1. No retrocochlear lesion identified. 2. Mild interval increase in size of the right parietal convexity meningioma. 3. Advanced chronic microvascular ischemic changes of the white matter, stable. Electronically Signed   By: Katyucia  de Macedo Rodrigues M.D.   On: 04/14/2022 10:39    Assessment & Plan:   Problem List Items Addressed This Visit     Piriformis syndrome of right side - Primary   Condition nature was discussed.  Information provided.  Prescribed Medrol  Dosepak, Norco as needed. Use heat, ice, massage.  Hip opener exercises.   Follow-up with Dr. Nicolette Barrio      Trochanteric bursitis   Condition nature was discussed.  Information provided.   Prescribed Medrol  Dosepak, Norco as needed. Use heat, ice, massage.  Hip opener exercises.  Steroid injection in the hip was offered and declined. Follow-up with Dr. Nicolette Barrio         Meds ordered this encounter  Medications   methylPREDNISolone  (MEDROL  DOSEPAK) 4 MG TBPK tablet    Sig: As directed    Dispense:  21 tablet    Refill:  0   HYDROcodone -acetaminophen  (NORCO/VICODIN) 5-325 MG tablet    Sig: Take 1 tablet by mouth every 6 (six) hours as needed for severe pain (pain score 7-10).    Dispense:  20 tablet    Refill:  0      Follow-up: Return in about 2 weeks (around 11/25/2023) for f/u with PCP.  Anitra Barn, MD

## 2023-11-12 ENCOUNTER — Telehealth: Payer: Self-pay

## 2023-11-12 NOTE — Telephone Encounter (Signed)
 I have printed and placed up front for patient but this was from when patient seen Dr Georgia Kipper yesterday

## 2023-11-12 NOTE — Telephone Encounter (Signed)
 Copied from CRM [id number]. Topic: General - Other >> Nov 12, 2023  8:19 AM Allyne Areola wrote: Reason for CRM: Patient was seen in the office yesterday and forgot to take with her the instructions Dr.Crawford left for her. She would like to know if she can pass by the office and pick it up. Please advise patient.

## 2023-11-15 NOTE — Assessment & Plan Note (Addendum)
 Condition nature was discussed.  Information provided.  Prescribed Medrol  Dosepak, Norco as needed. Use heat, ice, massage.  Hip opener exercises.   Follow-up with Dr. Nicolette Barrio

## 2023-11-15 NOTE — Assessment & Plan Note (Signed)
 Condition nature was discussed.  Information provided.  Prescribed Medrol  Dosepak, Norco as needed. Use heat, ice, massage.  Hip opener exercises.  Steroid injection in the hip was offered and declined. Follow-up with Dr. Nicolette Barrio

## 2023-11-24 ENCOUNTER — Other Ambulatory Visit: Payer: Self-pay | Admitting: Internal Medicine

## 2023-11-29 ENCOUNTER — Ambulatory Visit (INDEPENDENT_AMBULATORY_CARE_PROVIDER_SITE_OTHER): Admitting: Internal Medicine

## 2023-11-29 ENCOUNTER — Encounter: Payer: Self-pay | Admitting: Physical Therapy

## 2023-11-29 ENCOUNTER — Ambulatory Visit: Admitting: Physical Therapy

## 2023-11-29 ENCOUNTER — Encounter: Payer: Self-pay | Admitting: Internal Medicine

## 2023-11-29 VITALS — BP 116/80 | HR 72 | Temp 98.4°F | Ht 65.0 in | Wt 111.0 lb

## 2023-11-29 DIAGNOSIS — M5441 Lumbago with sciatica, right side: Secondary | ICD-10-CM

## 2023-11-29 DIAGNOSIS — R29898 Other symptoms and signs involving the musculoskeletal system: Secondary | ICD-10-CM

## 2023-11-29 DIAGNOSIS — M5416 Radiculopathy, lumbar region: Secondary | ICD-10-CM | POA: Diagnosis not present

## 2023-11-29 DIAGNOSIS — M5459 Other low back pain: Secondary | ICD-10-CM | POA: Diagnosis not present

## 2023-11-29 DIAGNOSIS — M6281 Muscle weakness (generalized): Secondary | ICD-10-CM

## 2023-11-29 DIAGNOSIS — M25551 Pain in right hip: Secondary | ICD-10-CM | POA: Diagnosis not present

## 2023-11-29 MED ORDER — KETOROLAC TROMETHAMINE 30 MG/ML IJ SOLN
30.0000 mg | Freq: Once | INTRAMUSCULAR | Status: AC
  Administered 2023-11-29: 30 mg via INTRAMUSCULAR

## 2023-11-29 MED ORDER — METHYLPREDNISOLONE ACETATE 40 MG/ML IJ SUSP
40.0000 mg | Freq: Once | INTRAMUSCULAR | Status: AC
Start: 2023-11-29 — End: 2023-11-29
  Administered 2023-11-29: 40 mg via INTRAMUSCULAR

## 2023-11-29 NOTE — Therapy (Signed)
 OUTPATIENT PHYSICAL THERAPY EVALUATION   Patient Name: Amy Dunn MRN: [id number] DOB:18-May-1941, 83 y.o., female Today's Date: 11/29/2023  END OF SESSION:  PT End of Session - 11/29/23 1429     Visit Number 1    Number of Visits 12    Date for PT Re-Evaluation 01/10/24    Authorization Type [employer] $10 copay    Progress Note Due on Visit 10    PT Start Time 1427    PT Stop Time 1457    PT Time Calculation (min) 30 min    Activity Tolerance Patient tolerated treatment well    Behavior During Therapy WFL for tasks assessed/performed          Past Medical History:  Diagnosis Date   Acute peptic ulcer, unspecified site, with hemorrhage and perforation, without mention of obstruction    Cancer of left lung (HCC) 2015   Cough    GERD (gastroesophageal reflux disease)    Glaucoma    right eye   History of colonic polyps    Hypertension    Osteoarthritis of left shoulder    Primary osteoarthritis of left hip    Pulmonary nodule, left    Left Lower lobe   Stroke (HCC)    no residual   TIA (transient ischemic attack) 2011   transient blindness, speech- resolved   Past Surgical History:  Procedure Laterality Date   COLON SURGERY     EYE SURGERY Bilateral    cataracts   surgical repair of peptic ulcer     TUBAL LIGATION     VIDEO ASSISTED THORACOSCOPY (VATS)/WEDGE RESECTION Left 01/29/2014   Procedure: VIDEO ASSISTED THORACOSCOPY (VATS)/BASILAR SEGMENTECTOMY  LEFT LUNG LOWER LOBE WITH FROZEN SECTION, LEFT LOWER LOBECTOMY;  Surgeon: Elspeth JAYSON Millers, MD;  Location: [facility] OR;  Service: Thoracic;  Laterality: Left;   Patient Active Problem List   Diagnosis Date Noted   Piriformis syndrome of right side 11/11/2023   Trochanteric bursitis 11/11/2023   Acute right-sided low back pain with right-sided sciatica 10/06/2023   Weight loss 10/06/2023   Poor dentition 10/06/2023   Left leg pain 08/07/2019   Postmenopausal bleeding 07/17/2015   Chronic cough 03/24/2013    Routine general medical examination at a health care facility 03/14/2013   Benign meningioma of brain (HCC) 11/03/2012   Allergic rhinitis 10/10/2012   TIA (transient ischemic attack) 01/17/2012   Osteoarthritis 03/06/2010   Adenocarcinoma of lung, stage 1 (HCC) 02/17/2010   Essential hypertension 01/30/2009   G E REFLUX 01/30/2009    PCP: Rollene Almarie LABOR, MD  REFERRING PROVIDER: Rollene Almarie LABOR, *  REFERRING DIAG: M54.41 (ICD-10-CM) - Acute right-sided low back pain with right-sided sciatica  Rationale for Evaluation and Treatment: Rehabilitation  THERAPY DIAG:  Other low back pain - Plan: PT plan of care cert/re-cert  Pain in right hip - Plan: PT plan of care cert/re-cert  Radiculopathy, lumbar region - Plan: PT plan of care cert/re-cert  Other symptoms and signs involving the musculoskeletal system - Plan: PT plan of care cert/re-cert  Muscle weakness (generalized) - Plan: PT plan of care cert/re-cert  ONSET DATE: 10/28/23   SUBJECTIVE:  SUBJECTIVE STATEMENT: Pt reports on 10/28/23 she was on a road trip and in the car for 4 hours.  Since then she has had pain in her Rt low back and into Rt hip.  She reports radicular symptoms to her ankle.    PERTINENT HISTORY:  Hx lung cancer, HTN, OA, hx CVA  PAIN:  Are you having pain? Yes: NPRS scale: 8 currently, up to 10, at best 8/10 Pain location: Rt hip/low back, radiates to Rt ankle Pain description: stabbing, constant pain when walking, spasm Aggravating factors: walking Relieving factors: nothing, aleve was helping but unable to take it due to GI issues  PRECAUTIONS:  None  RED FLAGS: None   WEIGHT BEARING RESTRICTIONS:  No  FALLS:  Has patient fallen in last 6 months? 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  DIAGNOSTIC FINDINGS:  1. No acute fracture or traumatic listhesis. 2. Facet arthropathy in the lower lumbar spine.  PATIENT SURVEYS:  Patient-Specific Activity Scoring Scheme  0 represents "unable to perform." 10 represents "able to perform at prior level. 0 1 2 3 4 5 6 7 8 9  10 (Date and Score)   Activity Eval     1. Traveling 1     2. Gardening 1    3. House Work 1   Score 1    Total score = sum of the activity scores/number of activities Minimum detectable change (90%CI) for average score = 2 points Minimum detectable change (90%CI) for single activity score = 3 points    COGNITIVE STATUS: Within functional limits for tasks assessed   SENSATION: WFL  POSTURE:  rounded shoulders, forward head, increased lumbar lordosis, and weight shift left   GAIT: 11/29/23 Comments: mild antalgic gait on Rt   PALPATION: 11/29/23 trigger point in glute med and piriformis noted on Rt; mild pain with lumbar mobs   LUMBAR ROM:   ROM A/PROM  eval  Flexion WNL  Extension WNL with pain  Right quadrant WNL  Left quadrant WNL   (Blank rows = not tested)     LOWER EXTREMITY MMT:    MMT Right eval Left eval  Hip flexion 3/5 4/5  Hip extension 3/5 with pain   Hip abduction 3/5 with pain   Hip adduction    Hip internal rotation    Hip external rotation    Knee flexion    Knee extension    Ankle dorsiflexion    Ankle  plantarflexion    Ankle inversion    Ankle eversion     (Blank rows = not tested)    SPECIAL TESTS:  11/29/23 Lumbar Slump test: mild symptoms on Rt but no change with decreased neural tension   TREATMENT:  DATE:  11/29/23 TherEx See HEP - demonstrated with trial reps performed PRN, mod cues for comprehension    Self Care Educated on PT POC and clinical findings; plan to follow up with specialist if no improvement in symptoms Did discuss DN as possibility; will try other techniques first    PATIENT EDUCATION:  Education details: HEP, DN Person educated: Patient Education method: Explanation, Demonstration, and Handouts Education comprehension: verbalized understanding, returned demonstration, and needs further education  HOME EXERCISE PROGRAM: Access Code: [email or web address] URL: [email or web address] Date: 11/29/2023 Prepared by: Corean Ku  Exercises - Prone on Elbows Stretch  - 2 x daily - 7 x weekly - 1 sets - 1 reps - 3 min hold - Hooklying Single Knee to Chest  - 2 x daily - 7 x weekly - 1 sets - 3 reps - 30 sec hold - Supine Piriformis Stretch with Foot on Ground  - 2 x daily - 7 x weekly - 1 sets - 3 reps - 30 sec hold - Self Release   - 2-3 x daily - 7 x weekly - 1 sets - 1 reps - 30-60 sec hold   ASSESSMENT:  CLINICAL IMPRESSION: Patient is a 83 y.o. female who was seen today for physical therapy evaluation and treatment for Rt sided LBP and Rt hip pain. She demonstrates continued pain, postural abnormalities and strength deficits affecting functional mobility.  She will benefit from PT to address deficits listed.     OBJECTIVE IMPAIRMENTS: Abnormal gait, decreased mobility, difficulty walking, decreased strength, increased fascial restrictions, increased muscle spasms, postural dysfunction, and pain.   ACTIVITY  LIMITATIONS: carrying, lifting, standing, sleeping, stairs, transfers, bed mobility, and locomotion level  PARTICIPATION LIMITATIONS: meal prep, cleaning, laundry, driving, shopping, community activity, and yard work  PERSONAL FACTORS: 3+ comorbidities: Hx lung cancer, HTN, OA, hx CVA are also affecting patient's functional outcome.   REHAB POTENTIAL: Good  CLINICAL DECISION MAKING: Evolving/moderate complexity  EVALUATION COMPLEXITY: Moderate   GOALS: Goals reviewed with patient? Yes  SHORT TERM GOALS: Target date: 12/20/2023  Independent with initial HEP Goal status: INITIAL   LONG TERM GOALS: Target date: 01/10/2024  Independent with final HEP Goal status: INITIAL  2.  PSFS score improved by 3 points Goal status: INITIAL  3.  RLE strength improved to 4/5 for improved pain and mobility Goal status: INIITAL  4.  Report pain < 3/10 with standing and walking for improved function Goal status: INITIAL  5.  Demonstrate ability to perform simulated gardening tasks without increase in pain for improved function. Goal status: INITIAL   PLAN:  PT FREQUENCY: 1-2x/week  PT DURATION: 6 weeks  PLANNED INTERVENTIONS: 97164- PT Re-evaluation, 97750- Physical Performance Testing, 97110-Therapeutic exercises, 97530- Therapeutic activity, 97112- Neuromuscular re-education, 97535- Self Care, [id number]- Manual therapy, [phone]- Gait training, [phone]- Aquatic Therapy, [phone]- Electrical stimulation (unattended), [id number]- Ultrasound, [id number]- Ionotophoresis 4mg /ml Dexamethasone , [id number] (1-2 muscles), [id number] (3+ muscles)- Dry Needling, Patient/Family education, Stair training, Taping, Joint mobilization, Joint manipulation, Spinal manipulation, Spinal mobilization, Cryotherapy, and Moist heat.  PLAN FOR NEXT SESSION: Review HEP, manual/modalities/DN PRN, hip strengthening as tolerated   NEXT MD VISIT: 08/23/24   Corean JULIANNA Ku, PT, DPT 11/29/23 3:04 PM

## 2023-11-29 NOTE — Assessment & Plan Note (Signed)
 Given depo-medrol  40 mg IM and toradol 30 mg IM at visit. Reviewed recent x-ray findings with her during visit. She has Pt later today which should help.

## 2023-11-29 NOTE — Progress Notes (Signed)
   Subjective:   Patient ID: Amy Dunn, female    DOB: [birth year], 83 y.o.   MRN: [id number]  HPI The patient is an 83 YO female coming in for ongoing right buttock and leg pain. Started about 1 month ago with long car ride in the low back and within 1 week was hurting in the right buttock/hip. X-ray done of low back and hips/pelvis with arthritis but no acute findings. Starting PT today. Saw another provider and given hydrocodone  and methylprednisolone  (she was not able to take either). The steroid bothered her stomach and pain medicine just made her drowsy.   Review of Systems  Constitutional: Negative.   HENT: Negative.    Eyes: Negative.   Respiratory:  Negative for cough, chest tightness and shortness of breath.   Cardiovascular:  Negative for chest pain, palpitations and leg swelling.  Gastrointestinal:  Negative for abdominal distention, abdominal pain, constipation, diarrhea, nausea and vomiting.  Musculoskeletal:  Positive for arthralgias, gait problem and myalgias.  Skin: Negative.   Psychiatric/Behavioral: Negative.      Objective:  Physical Exam Constitutional:      Appearance: She is well-developed.  HENT:     Head: Normocephalic and atraumatic.   Cardiovascular:     Rate and Rhythm: Normal rate and regular rhythm.  Pulmonary:     Effort: Pulmonary effort is normal. No respiratory distress.     Breath sounds: Normal breath sounds. No wheezing or rales.  Abdominal:     General: Bowel sounds are normal. There is no distension.     Palpations: Abdomen is soft.     Tenderness: There is no abdominal tenderness. There is no rebound.   Musculoskeletal:        General: Tenderness present.     Cervical back: Normal range of motion.     Comments: Pain right buttock region to palpation   Skin:    General: Skin is warm and dry.   Neurological:     Mental Status: She is alert and oriented to person, place, and time.     Coordination: Coordination normal.      Vitals:   11/29/23 0953  BP: 116/80  Pulse: 72  Temp: 98.4 F (36.9 C)  TempSrc: Oral  SpO2: 98%  Weight: 111 lb (50.3 kg)  Height: 5' 5 (1.651 m)    Assessment & Plan:  Depo-medrol  40 mg IM and Toradol 30 mg IM given at visit

## 2023-11-29 NOTE — Addendum Note (Signed)
 Addended by: ROSALVA LEX RAMAN on: 11/29/2023 11:20 AM   Modules accepted: Orders

## 2023-11-29 NOTE — Addendum Note (Signed)
 Addended by: ROSALVA LEX RAMAN on: 11/29/2023 10:31 AM   Modules accepted: Orders

## 2023-12-01 ENCOUNTER — Ambulatory Visit: Admitting: Rehabilitative and Restorative Service Providers"

## 2023-12-01 ENCOUNTER — Encounter: Payer: Self-pay | Admitting: Rehabilitative and Restorative Service Providers"

## 2023-12-01 DIAGNOSIS — M5459 Other low back pain: Secondary | ICD-10-CM

## 2023-12-01 DIAGNOSIS — M25551 Pain in right hip: Secondary | ICD-10-CM | POA: Diagnosis not present

## 2023-12-01 DIAGNOSIS — R29898 Other symptoms and signs involving the musculoskeletal system: Secondary | ICD-10-CM

## 2023-12-01 DIAGNOSIS — M6281 Muscle weakness (generalized): Secondary | ICD-10-CM

## 2023-12-01 DIAGNOSIS — M5416 Radiculopathy, lumbar region: Secondary | ICD-10-CM | POA: Diagnosis not present

## 2023-12-01 NOTE — Therapy (Signed)
 OUTPATIENT PHYSICAL THERAPY TREATMENT   Patient Name: Amy Dunn MRN: [id number] DOB:September 24, 1940, 83 y.o., female Today's Date: 12/01/2023  END OF SESSION:  PT End of Session - 12/01/23 1601     Visit Number 2    Number of Visits 12    Date for PT Re-Evaluation 01/10/24    Authorization Type [employer] $10 copay    Progress Note Due on Visit 10    PT Start Time 1558    PT Stop Time 1637    PT Time Calculation (min) 39 min    Activity Tolerance Patient tolerated treatment well    Behavior During Therapy WFL for tasks assessed/performed           Past Medical History:  Diagnosis Date   Acute peptic ulcer, unspecified site, with hemorrhage and perforation, without mention of obstruction    Cancer of left lung (HCC) 2015   Cough    GERD (gastroesophageal reflux disease)    Glaucoma    right eye   History of colonic polyps    Hypertension    Osteoarthritis of left shoulder    Primary osteoarthritis of left hip    Pulmonary nodule, left    Left Lower lobe   Stroke (HCC)    no residual   TIA (transient ischemic attack) 2011   transient blindness, speech- resolved   Past Surgical History:  Procedure Laterality Date   COLON SURGERY     EYE SURGERY Bilateral    cataracts   surgical repair of peptic ulcer     TUBAL LIGATION     VIDEO ASSISTED THORACOSCOPY (VATS)/WEDGE RESECTION Left 01/29/2014   Procedure: VIDEO ASSISTED THORACOSCOPY (VATS)/BASILAR SEGMENTECTOMY  LEFT LUNG LOWER LOBE WITH FROZEN SECTION, LEFT LOWER LOBECTOMY;  Surgeon: Elspeth JAYSON Millers, MD;  Location: [facility] OR;  Service: Thoracic;  Laterality: Left;   Patient Active Problem List   Diagnosis Date Noted   Piriformis syndrome of right side 11/11/2023   Trochanteric bursitis 11/11/2023   Acute right-sided low back pain with right-sided sciatica 10/06/2023   Weight loss 10/06/2023   Poor dentition 10/06/2023   Left leg pain 08/07/2019   Postmenopausal bleeding 07/17/2015   Chronic cough 03/24/2013    Routine general medical examination at a health care facility 03/14/2013   Benign meningioma of brain (HCC) 11/03/2012   Allergic rhinitis 10/10/2012   TIA (transient ischemic attack) 01/17/2012   Osteoarthritis 03/06/2010   Adenocarcinoma of lung, stage 1 (HCC) 02/17/2010   Essential hypertension 01/30/2009   G E REFLUX 01/30/2009    PCP: Rollene Almarie LABOR, MD  REFERRING PROVIDER: Rollene Almarie LABOR, *  REFERRING DIAG: M54.41 (ICD-10-CM) - Acute right-sided low back pain with right-sided sciatica  Rationale for Evaluation and Treatment: Rehabilitation  THERAPY DIAG:  Other low back pain  Pain in right hip  Radiculopathy, lumbar region  Other symptoms and signs involving the musculoskeletal system  Muscle weakness (generalized)  ONSET DATE: 10/28/23   SUBJECTIVE:  SUBJECTIVE STATEMENT: Pt indicated feeling the same today.  Pt indicated around 7/10 and took 2 aleve.   Reported having pain with press ups.   PERTINENT HISTORY:  Hx lung cancer, HTN, OA, hx CVA  PAIN:  NPRS scale: upon arrival 7/10 Pain location: Rt hip/low back, radiates to Rt ankle Pain description: stabbing, constant pain when walking, spasm Aggravating factors: walking Relieving factors: nothing, aleve was helping but unable to take it due to GI issues  PRECAUTIONS:  None  RED FLAGS: None   WEIGHT BEARING RESTRICTIONS:  No  FALLS:  Has patient fallen in last 6 months? 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  DIAGNOSTIC FINDINGS:  1. No acute fracture or traumatic listhesis. 2. Facet arthropathy in the lower lumbar spine.  PATIENT SURVEYS:  Patient-Specific Activity Scoring Scheme  0 represents "unable to perform." 10 represents "able to perform at prior level. 0 1 2 3 4 5 6 7 8 9  10 (Date and Score)   Activity Eval  11/29/23    1. Traveling 1     2. Gardening 1    3. House Work 1   Score 1    Total score = sum of the activity scores/number of activities Minimum detectable change (90%CI) for average score = 2 points Minimum detectable change (90%CI) for single activity score = 3 points    COGNITIVE STATUS: 11/29/23 Within functional limits for tasks assessed   SENSATION: 11/29/23 WFL  POSTURE:  11/29/23 rounded shoulders, forward head, increased lumbar lordosis, and weight shift left   GAIT: 11/29/23 Comments: mild antalgic gait on Rt   PALPATION: 11/29/23 trigger point in glute med and piriformis noted on Rt; mild pain with lumbar mobs   LUMBAR ROM:   12/01/2023: No leg symptoms produced down to ankle today in clinic ROM check. Chief complaint was reported for Rt buttock.  ROM AROM  eval AROM 12/01/2023  Flexion WNL To toes without pain change.   Extension WNL with pain 75% WFL with no pain change  Right quadrant WNL   Left quadrant WNL    (Blank rows = not tested)     LOWER EXTREMITY MMT:    MMT Right eval Left eval  Hip flexion 3/5 4/5  Hip extension 3/5 with pain   Hip abduction 3/5 with pain   Hip adduction    Hip internal rotation    Hip external rotation    Knee flexion    Knee extension    Ankle dorsiflexion    Ankle plantarflexion    Ankle inversion    Ankle eversion      (Blank rows = not tested)    SPECIAL TESTS:  11/29/23 Lumbar Slump test: mild symptoms on Rt but no change with decreased neural tension                   TREATMENT       DATE: 12/01/2023 Therex: Standing lumbar extension x 5 (performed  due to prone press up being painful ) Supine hooklying lumbar trunk rotation 15 sec x 3 bilateral  Supine bridge 2 sec hold x10 Supine SKC 30 sec x 3 bilateral  Supine figure 4 pull towards 30 sec x 3 bilateral  Supine hooklying clam shell with contralateral leg isometric hold focus green band x 20 bilateral   Verbal review of existing HEP and adjustment to plan base off symptom response.   Manual Percussive device to Rt glute max.  Cues given for possible use at home.    TREATMENT       DATE: 11/29/23 TherEx See HEP - demonstrated with trial reps performed PRN, mod cues for comprehension    Self Care Educated on PT POC and clinical findings; plan to follow up with specialist if no improvement in symptoms Did discuss DN as possibility; will try other techniques first    PATIENT EDUCATION:  12/01/2023 Education details: HEP update Person educated: Patient Education method: Explanation, Demonstration, and Handouts Education comprehension: verbalized understanding, returned demonstration, and needs further education  HOME EXERCISE PROGRAM: Access Code: [email or web address] URL: [email or web address] Date: 12/01/2023 Prepared by: Ozell Silvan  Exercises - Hooklying Single Knee to Chest (Mirrored)  - 2 x daily - 7 x weekly - 1 sets - 3 reps - 30 sec hold - Supine Piriformis Stretch with Foot on Ground  - 2 x daily - 7 x weekly - 1 sets - 3 reps - 30 sec hold - Supine Lower Trunk Rotation  - 2-3 x daily - 7 x weekly - 1 sets - 3-5 reps - 15 hold - Standing Lumbar Extension with Counter  - 3-5 x daily - 7 x weekly - 1 sets - 5-10 reps - Supine Bridge  - 1 x daily - 7 x weekly - 1-2 sets - 10 reps - 2 hold - Self Release   - 2-3 x daily - 7  x weekly - 1 sets - 1 reps - 30-60 sec hold   ASSESSMENT:  CLINICAL IMPRESSION: Adjustment to some HEP due to repetitive pain complaints indicated (press ups).  Percussive device used with fair tolerance but improved symptoms post use noted.  Discussed possible option for use at home as desired.  Pt may continue to benefit from skilled PT services to progress towards goals and reduced symptoms.   Reduced pain complaints noted post treatment today compared to arrival.   OBJECTIVE IMPAIRMENTS: Abnormal gait, decreased mobility, difficulty walking, decreased strength, increased fascial restrictions, increased muscle spasms, postural dysfunction, and pain.   ACTIVITY LIMITATIONS: carrying, lifting, standing, sleeping, stairs, transfers, bed mobility, and locomotion level  PARTICIPATION LIMITATIONS: meal prep, cleaning, laundry, driving, shopping, community activity, and yard work  PERSONAL FACTORS: 3+ comorbidities: Hx lung cancer, HTN, OA, hx CVA are also affecting patient's functional outcome.   REHAB POTENTIAL: Good  CLINICAL DECISION MAKING: Evolving/moderate complexity  EVALUATION COMPLEXITY: Moderate   GOALS: Goals reviewed with patient? Yes  SHORT TERM GOALS: Target date: 12/20/2023  Independent with initial HEP Goal status: on going 12/01/2023   LONG TERM GOALS: Target date: 01/10/2024  Independent with final HEP Goal status: INITIAL  2.  PSFS score improved by 3 points Goal status: INITIAL  3.  RLE strength improved to 4/5 for improved pain and mobility Goal status: INIITAL  4.  Report pain < 3/10 with standing and walking for improved function Goal status: INITIAL  5.  Demonstrate ability to perform simulated gardening tasks without increase in pain for improved function. Goal status: INITIAL  PLAN:  PT FREQUENCY: 1-2x/week  PT DURATION: 6 weeks  PLANNED INTERVENTIONS: 97164- PT Re-evaluation, 97750- Physical Performance Testing, 97110-Therapeutic  exercises, 97530- Therapeutic activity, 97112- Neuromuscular re-education, 97535- Self Care, 02859- Manual therapy, [phone]- Gait training, [phone]- Aquatic Therapy, [phone]- Electrical stimulation (unattended), 97035- Ultrasound, [id number]- Ionotophoresis 4mg /ml Dexamethasone , 79439 (1-2 muscles), 20561 (3+ muscles)- Dry Needling, Patient/Family education, Stair training, Taping, Joint mobilization, Joint manipulation, Spinal manipulation, Spinal mobilization, Cryotherapy, and Moist heat.  PLAN FOR NEXT SESSION: How was percussive device? Progressive hip strengthening as able.    NEXT MD VISIT: 08/23/24   Ozell Silvan, PT, DPT, OCS, ATC 12/01/23  4:26 PM

## 2023-12-06 ENCOUNTER — Ambulatory Visit: Payer: Self-pay | Admitting: Rehabilitative and Restorative Service Providers"

## 2023-12-06 ENCOUNTER — Encounter: Payer: Self-pay | Admitting: Rehabilitative and Restorative Service Providers"

## 2023-12-06 DIAGNOSIS — M6281 Muscle weakness (generalized): Secondary | ICD-10-CM

## 2023-12-06 DIAGNOSIS — M5416 Radiculopathy, lumbar region: Secondary | ICD-10-CM

## 2023-12-06 DIAGNOSIS — M25551 Pain in right hip: Secondary | ICD-10-CM

## 2023-12-06 DIAGNOSIS — R29898 Other symptoms and signs involving the musculoskeletal system: Secondary | ICD-10-CM

## 2023-12-06 DIAGNOSIS — M5459 Other low back pain: Secondary | ICD-10-CM | POA: Diagnosis not present

## 2023-12-06 NOTE — Therapy (Signed)
 OUTPATIENT PHYSICAL THERAPY TREATMENT   Patient Name: Amy Dunn MRN: [id number] DOB:10/02/1940, 83 y.o., female Today's Date: 12/06/2023  END OF SESSION:  PT End of Session - 12/06/23 1349     Visit Number 3    Number of Visits 12    Date for PT Re-Evaluation 01/10/24    Authorization Type [employer] Medicare $10 copay    Progress Note Due on Visit 10    PT Start Time 1344    PT Stop Time 1423    PT Time Calculation (min) 39 min    Activity Tolerance Patient tolerated treatment well    Behavior During Therapy WFL for tasks assessed/performed            Past Medical History:  Diagnosis Date   Acute peptic ulcer, unspecified site, with hemorrhage and perforation, without mention of obstruction    Cancer of left lung (HCC) 2015   Cough    GERD (gastroesophageal reflux disease)    Glaucoma    right eye   History of colonic polyps    Hypertension    Osteoarthritis of left shoulder    Primary osteoarthritis of left hip    Pulmonary nodule, left    Left Lower lobe   Stroke (HCC)    no residual   TIA (transient ischemic attack) 2011   transient blindness, speech- resolved   Past Surgical History:  Procedure Laterality Date   COLON SURGERY     EYE SURGERY Bilateral    cataracts   surgical repair of peptic ulcer     TUBAL LIGATION     VIDEO ASSISTED THORACOSCOPY (VATS)/WEDGE RESECTION Left 01/29/2014   Procedure: VIDEO ASSISTED THORACOSCOPY (VATS)/BASILAR SEGMENTECTOMY  LEFT LUNG LOWER LOBE WITH FROZEN SECTION, LEFT LOWER LOBECTOMY;  Surgeon: Elspeth JAYSON Millers, MD;  Location: [facility] OR;  Service: Thoracic;  Laterality: Left;   Patient Active Problem List   Diagnosis Date Noted   Piriformis syndrome of right side 11/11/2023   Trochanteric bursitis 11/11/2023   Acute right-sided low back pain with right-sided sciatica 10/06/2023   Weight loss 10/06/2023   Poor dentition 10/06/2023   Left leg pain 08/07/2019   Postmenopausal bleeding 07/17/2015   Chronic cough  03/24/2013   Routine general medical examination at a health care facility 03/14/2013   Benign meningioma of brain (HCC) 11/03/2012   Allergic rhinitis 10/10/2012   TIA (transient ischemic attack) 01/17/2012   Osteoarthritis 03/06/2010   Adenocarcinoma of lung, stage 1 (HCC) 02/17/2010   Essential hypertension 01/30/2009   G E REFLUX 01/30/2009    PCP: Rollene Almarie LABOR, MD  REFERRING PROVIDER: Rollene Almarie LABOR, *  REFERRING DIAG: M54.41 (ICD-10-CM) - Acute right-sided low back pain with right-sided sciatica  Rationale for Evaluation and Treatment: Rehabilitation  THERAPY DIAG:  Other low back pain  Pain in right hip  Radiculopathy, lumbar region  Other symptoms and signs involving the musculoskeletal system  Muscle weakness (generalized)  ONSET DATE: 10/28/23   SUBJECTIVE:  SUBJECTIVE STATEMENT: Pt indicated feeling increased pain this morning but better than it was now.  Reported about 30 mins of improvement after last visit.  Reported a little bit of improvement with HEP.   PERTINENT HISTORY:  Hx lung cancer, HTN, OA, hx CVA  PAIN:  NPRS scale: upon arrival 6/10.  Pain location: Rt hip/low back, radiates to Rt ankle Pain description: stabbing, constant pain when walking, spasm Aggravating factors: walking Relieving factors: nothing, aleve was helping but unable to take it due to GI issues  PRECAUTIONS:  None  RED FLAGS: None   WEIGHT BEARING RESTRICTIONS:  No  FALLS:  Has patient fallen in last 6 months? 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  DIAGNOSTIC FINDINGS:  1. No acute fracture or traumatic listhesis. 2. Facet arthropathy in the lower lumbar spine.  PATIENT SURVEYS:  Patient-Specific Activity Scoring Scheme  0 represents "unable to perform." 10 represents "able to perform at prior level. 0 1 2 3 4 5 6 7 8 9  10 (Date and Score)   Activity Eval  11/29/23    1. Traveling 1     2. Gardening 1    3. House Work 1   Score 1    Total score = sum of the activity scores/number of activities Minimum detectable change (90%CI) for average score = 2 points Minimum detectable change (90%CI) for single activity score = 3 points    COGNITIVE STATUS: 11/29/23 Within functional limits for tasks assessed   SENSATION: 11/29/23 WFL  POSTURE:  11/29/23 rounded shoulders, forward head, increased lumbar lordosis, and weight shift left   GAIT: 11/29/23 Comments: mild antalgic gait on Rt   PALPATION: 11/29/23 trigger point in glute med and piriformis noted on Rt; mild pain with lumbar mobs   LUMBAR ROM:   12/01/2023: No leg symptoms produced down to ankle today in clinic ROM check. Chief complaint was reported for Rt buttock.  ROM AROM  eval AROM 12/01/2023  Flexion WNL To toes without pain change.   Extension WNL with pain 75% WFL with no pain change  Right quadrant WNL   Left quadrant WNL    (Blank rows = not tested)     LOWER EXTREMITY MMT:    MMT Right eval Left eval  Hip flexion 3/5 4/5  Hip extension 3/5 with pain   Hip abduction 3/5 with pain   Hip adduction    Hip internal rotation    Hip external rotation    Knee flexion    Knee extension    Ankle  dorsiflexion    Ankle plantarflexion    Ankle inversion    Ankle eversion     (Blank rows = not tested)    SPECIAL TESTS:  11/29/23 Lumbar Slump test: mild symptoms on Rt but no change with decreased neural tension                  TREATMENT  DATE: 12/06/2023 Therex: Nustep Lvl 5 5 mins UE/LE - stopped due to general fatigue.  Sidelying Rt hip clam shell 2 x 10  (support from clinician to prevent rolling )  Supine hooklying lumbar trunk rotation 15 sec x 3 bilateral  Supine bridge 2 sec hold x10 Supine SKC 30 sec x 3 bilateral  Supine figure 4 pull towards 30 sec x 3 bilateral  Standing lumbar extension AROM 2 x 5  Continued cues for routine HEP use.   Manual Percussive device to Rt glute max, performed in Lt sidelying  TREATMENT       DATE: 12/01/2023 Therex: Standing lumbar extension x 5 (performed due to prone press up being painful ) Supine hooklying lumbar trunk rotation 15 sec x 3 bilateral  Supine bridge 2 sec hold x10 Supine SKC 30 sec x 3 bilateral  Supine figure 4 pull towards 30 sec x 3 bilateral  Supine hooklying clam shell with contralateral leg isometric hold focus green band x 20 bilateral   Verbal review of existing HEP and adjustment to plan base off symptom response.   Manual Percussive device to Rt glute max.  Cues given for possible use at home.    TREATMENT       DATE: 11/29/23 TherEx See HEP - demonstrated with trial reps performed PRN, mod cues for comprehension    Self Care Educated on PT POC and clinical findings; plan to follow up with specialist if no improvement in symptoms Did discuss DN as possibility; will try other techniques first    PATIENT EDUCATION:  12/01/2023 Education details: HEP update Person educated: Patient Education method: Explanation, Demonstration, and Handouts Education comprehension: verbalized understanding, returned demonstration, and needs further education  HOME EXERCISE PROGRAM: Access Code:  [email or web address] URL: [email or web address] Date: 12/01/2023 Prepared by: Ozell Silvan  Exercises - Hooklying Single Knee to Chest (Mirrored)  - 2 x daily - 7 x weekly - 1 sets - 3 reps - 30 sec hold - Supine Piriformis Stretch with Foot on Ground  - 2 x daily - 7 x weekly - 1 sets - 3 reps - 30 sec hold - Supine Lower Trunk Rotation  - 2-3 x daily - 7 x weekly - 1 sets - 3-5 reps - 15 hold - Standing Lumbar Extension with Counter  - 3-5 x daily - 7 x weekly - 1 sets - 5-10 reps - Supine Bridge  - 1 x daily - 7 x weekly - 1-2 sets - 10 reps - 2 hold - Self Release   - 2-3 x daily - 7 x weekly - 1 sets - 1 reps - 30-60 sec hold   ASSESSMENT:  CLINICAL IMPRESSION: Continued review of HEP and use for symptom relief. No exacerbation of symptoms noted in clinic activity and reduced pain compared to arrival noted.  Rt glute max /deep hip rotator trigger point/tightness still noted in palpation and during use of percussive device.  Continued cues required for reinforcement of techniques on exercise   OBJECTIVE IMPAIRMENTS: Abnormal gait, decreased mobility, difficulty walking, decreased strength, increased fascial restrictions, increased muscle spasms, postural dysfunction, and pain.   ACTIVITY LIMITATIONS: carrying, lifting, standing, sleeping, stairs, transfers, bed mobility, and locomotion level  PARTICIPATION LIMITATIONS: meal prep, cleaning, laundry, driving, shopping, community activity, and yard work  PERSONAL FACTORS: 3+ comorbidities: Hx lung cancer, HTN, OA, hx CVA are also affecting patient's functional outcome.   REHAB POTENTIAL: Good  CLINICAL DECISION MAKING: Evolving/moderate complexity  EVALUATION COMPLEXITY: Moderate   GOALS: Goals reviewed  with patient? Yes  SHORT TERM GOALS: Target date: 12/20/2023  Independent with initial HEP Goal status: on going 12/01/2023   LONG TERM GOALS: Target date: 01/10/2024  Independent with final HEP Goal status:  INITIAL  2.  PSFS score improved by 3 points Goal status: INITIAL  3.  RLE strength improved to 4/5 for improved pain and mobility Goal status: INIITAL  4.  Report pain < 3/10 with standing and walking for improved function Goal status: INITIAL  5.  Demonstrate ability to perform simulated gardening tasks without increase in pain for improved function. Goal status: INITIAL   PLAN:  PT FREQUENCY: 1-2x/week  PT DURATION: 6 weeks  PLANNED INTERVENTIONS: 97164- PT Re-evaluation, 97750- Physical Performance Testing, 97110-Therapeutic exercises, 97530- Therapeutic activity, 97112- Neuromuscular re-education, 97535- Self Care, 02859- Manual therapy, [phone]- Gait training, [phone]- Aquatic Therapy, [phone]- Electrical stimulation (unattended), 97035- Ultrasound, [id number]- Ionotophoresis 4mg /ml Dexamethasone , 79439 (1-2 muscles), 20561 (3+ muscles)- Dry Needling, Patient/Family education, Stair training, Taping, Joint mobilization, Joint manipulation, Spinal manipulation, Spinal mobilization, Cryotherapy, and Moist heat.  PLAN FOR NEXT SESSION: Percussive device as desired.  Progress hip strengthening as able. Continue to improve knowledge and performance of HEP.    NEXT MD VISIT: 08/23/24   Ozell Silvan, PT, DPT, OCS, ATC 12/06/23  2:21 PM

## 2023-12-13 ENCOUNTER — Ambulatory Visit: Payer: Self-pay

## 2023-12-13 ENCOUNTER — Ambulatory Visit (INDEPENDENT_AMBULATORY_CARE_PROVIDER_SITE_OTHER): Admitting: Internal Medicine

## 2023-12-13 ENCOUNTER — Encounter: Payer: Self-pay | Admitting: Internal Medicine

## 2023-12-13 VITALS — BP 120/82 | HR 94 | Temp 97.8°F | Ht 65.0 in | Wt 111.0 lb

## 2023-12-13 DIAGNOSIS — M5441 Lumbago with sciatica, right side: Secondary | ICD-10-CM | POA: Diagnosis not present

## 2023-12-13 MED ORDER — DICLOFENAC SODIUM 75 MG PO TBEC: 75.0000 mg | DELAYED_RELEASE_TABLET | Freq: Two times a day (BID) | ORAL | 0 refills | Status: AC

## 2023-12-13 NOTE — Telephone Encounter (Signed)
 FYI Only or Action Required?: FYI only for provider.  Patient was last seen in primary care on 11/29/2023 by Rollene Almarie LABOR, MD.  Called Nurse Triage reporting Pain.  Symptoms began x 4 weeks and worsening.  Interventions attempted: OTC medications: aleve, tylenol .  Symptoms are: gradually worsening.  Triage Disposition: See HCP Within 4 Hours (Or PCP Triage)  Patient/caregiver understands and will follow disposition?: Yes     Copied from CRM [id number]. Topic: Clinical - Red Word Triage >> Dec 13, 2023  8:18 AM Amy Dunn wrote: Red Word that prompted transfer to Nurse Triage: Sciatic nerve pain Reason for Disposition  [1] SEVERE pain (e.g., excruciating, unable to do any normal activities) AND [2] not improved after 2 hours of pain medicine  Answer Assessment - Initial Assessment Questions 1. ONSET: When did the pain start?      X 4 weeks 2. LOCATION: Where is the pain located?      Right hip then goes leg & foot 3. PAIN: How bad is the pain?    (Scale 1-10; or mild, moderate, severe)     10/10 pain : taking aleve but no longer therefore stopped taking 4. WORK OR EXERCISE: Has there been any recent work or exercise that involved this part of the body?      na 5. CAUSE: What do you think is causing the leg pain?     Sciatic nerve  6. OTHER SYMPTOMS: Do you have any other symptoms? (e.g., chest pain, back pain, breathing difficulty, swelling, rash, fever, numbness, weakness)     Tingling feet, leg & pain in hip 7. PREGNANCY: Is there any chance you are pregnant? When was your last menstrual period?     N/a   Pt stated none of the OTC medications she has used is helping the pain at this point.  Pt states she is active with PT: but it seems like she is getting worse instead of better.  Protocols used: Leg Pain-A-AH

## 2023-12-13 NOTE — Assessment & Plan Note (Signed)
 She has progressed in pain and failed conservative treatment. She is more than 6 weeks from onset and has tried PT. X-ray with arthritis but otherwise non-specific. I suspect pinched nerve and she needs MRI to help with targeting for injection. Ordered MRI lumbar today. Rx diclofenac  for pain.

## 2023-12-13 NOTE — Progress Notes (Signed)
   Subjective:   Patient ID: Amy Dunn, female    DOB: [birth year], 83 y.o.   MRN: [id number]  Hip Pain    The patient is an 83 YO female coming in for ongoing low back pain radiating to right leg. More than 6 weeks now and has tried PT and this is not helping. Overall worsening and more pain in the leg. Cannot get comfortable.   Review of Systems  Constitutional:  Positive for activity change. Negative for appetite change, chills, fatigue, fever and unexpected weight change.  Respiratory: Negative.    Cardiovascular: Negative.   Gastrointestinal: Negative.   Musculoskeletal:  Positive for arthralgias, back pain and myalgias. Negative for gait problem and joint swelling.  Skin: Negative.   Neurological: Negative.     Objective:  Physical Exam Constitutional:      Appearance: She is well-developed.  HENT:     Head: Normocephalic and atraumatic.  Cardiovascular:     Rate and Rhythm: Normal rate and regular rhythm.  Pulmonary:     Effort: Pulmonary effort is normal. No respiratory distress.     Breath sounds: Normal breath sounds. No wheezing or rales.  Abdominal:     General: Bowel sounds are normal. There is no distension.     Palpations: Abdomen is soft.     Tenderness: There is no abdominal tenderness. There is no rebound.  Musculoskeletal:        General: Tenderness present.     Cervical back: Normal range of motion.  Skin:    General: Skin is warm and dry.  Neurological:     Mental Status: She is alert and oriented to person, place, and time.     Coordination: Coordination normal.     Vitals:   12/13/23 1028  BP: 120/82  Pulse: 94  Temp: 97.8 F (36.6 C)  TempSrc: Temporal  SpO2: 97%  Weight: 111 lb (50.3 kg)  Height: 5' 5 (1.651 m)    Assessment & Plan:

## 2023-12-13 NOTE — Patient Instructions (Addendum)
 We will get an MRI of the back.  We have sent in diclofenac  which is an nsaid to take twice a day for the pain.

## 2023-12-14 ENCOUNTER — Encounter: Payer: Self-pay | Admitting: Internal Medicine

## 2023-12-16 ENCOUNTER — Encounter: Payer: Self-pay | Admitting: Internal Medicine

## 2023-12-17 ENCOUNTER — Ambulatory Visit: Admitting: Physical Therapy

## 2023-12-17 ENCOUNTER — Encounter: Payer: Self-pay | Admitting: Physical Therapy

## 2023-12-17 DIAGNOSIS — M5459 Other low back pain: Secondary | ICD-10-CM | POA: Diagnosis not present

## 2023-12-17 DIAGNOSIS — M6281 Muscle weakness (generalized): Secondary | ICD-10-CM

## 2023-12-17 DIAGNOSIS — R29898 Other symptoms and signs involving the musculoskeletal system: Secondary | ICD-10-CM | POA: Diagnosis not present

## 2023-12-17 DIAGNOSIS — M5416 Radiculopathy, lumbar region: Secondary | ICD-10-CM | POA: Diagnosis not present

## 2023-12-17 DIAGNOSIS — M25551 Pain in right hip: Secondary | ICD-10-CM | POA: Diagnosis not present

## 2023-12-17 NOTE — Therapy (Signed)
 OUTPATIENT PHYSICAL THERAPY TREATMENT   Patient Name: Amy Dunn MRN: [id number] DOB:Jan 26, 1941, 83 y.o., female Today's Date: 12/17/2023  END OF SESSION:  PT End of Session - 12/17/23 1119     Visit Number 4    Number of Visits 12    Date for PT Re-Evaluation 01/10/24    Authorization Type [employer] $10 copay    Progress Note Due on Visit 10    PT Start Time 1102    PT Stop Time 1141    PT Time Calculation (min) 39 min    Activity Tolerance Patient tolerated treatment well    Behavior During Therapy WFL for tasks assessed/performed             Past Medical History:  Diagnosis Date   Acute peptic ulcer, unspecified site, with hemorrhage and perforation, without mention of obstruction    Cancer of left lung (HCC) 2015   Cough    GERD (gastroesophageal reflux disease)    Glaucoma    right eye   History of colonic polyps    Hypertension    Osteoarthritis of left shoulder    Primary osteoarthritis of left hip    Pulmonary nodule, left    Left Lower lobe   Stroke (HCC)    no residual   TIA (transient ischemic attack) 2011   transient blindness, speech- resolved   Past Surgical History:  Procedure Laterality Date   COLON SURGERY     EYE SURGERY Bilateral    cataracts   surgical repair of peptic ulcer     TUBAL LIGATION     VIDEO ASSISTED THORACOSCOPY (VATS)/WEDGE RESECTION Left 01/29/2014   Procedure: VIDEO ASSISTED THORACOSCOPY (VATS)/BASILAR SEGMENTECTOMY  LEFT LUNG LOWER LOBE WITH FROZEN SECTION, LEFT LOWER LOBECTOMY;  Surgeon: Elspeth JAYSON Millers, MD;  Location: [facility] OR;  Service: Thoracic;  Laterality: Left;   Patient Active Problem List   Diagnosis Date Noted   Piriformis syndrome of right side 11/11/2023   Trochanteric bursitis 11/11/2023   Acute right-sided low back pain with right-sided sciatica 10/06/2023   Weight loss 10/06/2023   Poor dentition 10/06/2023   Left leg pain 08/07/2019   Postmenopausal bleeding 07/17/2015   Chronic cough  03/24/2013   Routine general medical examination at a health care facility 03/14/2013   Benign meningioma of brain (HCC) 11/03/2012   Allergic rhinitis 10/10/2012   TIA (transient ischemic attack) 01/17/2012   Osteoarthritis 03/06/2010   Adenocarcinoma of lung, stage 1 (HCC) 02/17/2010   Essential hypertension 01/30/2009   G E REFLUX 01/30/2009    PCP: Rollene Almarie LABOR, MD  REFERRING PROVIDER: Rollene Almarie LABOR, *  REFERRING DIAG: M54.41 (ICD-10-CM) - Acute right-sided low back pain with right-sided sciatica  Rationale for Evaluation and Treatment: Rehabilitation  THERAPY DIAG:  Other low back pain  Pain in right hip  Radiculopathy, lumbar region  Other symptoms and signs involving the musculoskeletal system  Muscle weakness (generalized)  ONSET DATE: 10/28/23   SUBJECTIVE:  SUBJECTIVE STATEMENT:  I feel like things are getting worse not better. Doctor gave me a pain pill and ordered an MRI. The pain pill has helped a bit   PERTINENT HISTORY:  Hx lung cancer, HTN, OA, hx CVA  PAIN:  NPRS scale: 7/10 Pain location: Rt hip Pain description: hurts to sit down, toothache  Aggravating factors: walking Relieving factors: new pain medicine can't remember the name   PRECAUTIONS:  None  RED FLAGS: None   WEIGHT BEARING RESTRICTIONS:  No  FALLS:  Has patient fallen in last 6 months? 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  DIAGNOSTIC FINDINGS:  1. No acute fracture or traumatic listhesis. 2. Facet arthropathy in the lower lumbar spine.  PATIENT SURVEYS:  Patient-Specific Activity Scoring Scheme  0 represents "unable to perform." 10 represents "able to perform at prior level. 0 1 2 3 4 5 6 7 8 9  10 (Date and Score)   Activity Eval  11/29/23    1. Traveling 1     2. Gardening 1    3. House Work 1   Score 1    Total score = sum of the activity scores/number of activities Minimum detectable change (90%CI) for average score = 2 points Minimum detectable change (90%CI) for single activity score = 3 points    COGNITIVE STATUS: 11/29/23 Within functional limits for tasks assessed   SENSATION: 11/29/23 WFL  POSTURE:  11/29/23 rounded shoulders, forward head, increased lumbar lordosis, and weight shift left   GAIT: 11/29/23 Comments: mild antalgic gait on Rt   PALPATION: 11/29/23 trigger point in glute med and piriformis noted on Rt; mild pain with lumbar mobs   LUMBAR ROM:   12/01/2023: No leg symptoms produced down to ankle today in clinic ROM check. Chief complaint was reported for Rt buttock.  ROM AROM  eval AROM 12/01/2023  Flexion WNL To toes without pain change.   Extension WNL with pain 75% WFL with no pain change  Right quadrant WNL   Left quadrant WNL    (Blank rows = not tested)     LOWER EXTREMITY MMT:    MMT Right eval Left eval  Hip flexion 3/5 4/5  Hip extension 3/5 with pain   Hip abduction 3/5 with pain   Hip adduction    Hip internal rotation    Hip external rotation    Knee flexion    Knee extension    Ankle dorsiflexion    Ankle plantarflexion    Ankle inversion    Ankle eversion     (Blank rows = not tested)    SPECIAL TESTS:  11/29/23  Lumbar Slump test: mild symptoms on Rt but no change with decreased neural tension                  TREATMENT       DATE:    12/17/23  Lumbar rotation stretch 3x30 seconds  SKTC 3x30 seconds B  Figure  4 stretch 3x30 seconds B  Bridges x15  Hooklying TA sets 20 x3 seconds  HS stretches 2x30 seconds B seated  Hip hikes at counter x12 B  Nustep L5x5 minutes all four extremities, seat 6 multiple short rest breaks (limited by fatigue)   12/06/2023 Therex: Nustep Lvl 5 5 mins UE/LE - stopped due to general fatigue.  Sidelying Rt hip clam shell 2 x 10  (support from clinician to prevent rolling )  Supine hooklying lumbar trunk rotation 15 sec x 3 bilateral  Supine bridge 2 sec hold x10 Supine SKC 30 sec x 3 bilateral  Supine figure 4 pull towards 30 sec x 3 bilateral  Standing lumbar extension AROM 2 x 5  Continued cues for routine HEP use.   Manual Percussive device to Rt glute max, performed in Lt sidelying  TREATMENT       DATE: 12/01/2023 Therex: Standing lumbar extension x 5 (performed due to prone press up being painful ) Supine hooklying lumbar trunk rotation 15 sec x 3 bilateral  Supine bridge 2 sec hold x10 Supine SKC 30 sec x 3 bilateral  Supine figure 4 pull towards 30 sec x 3 bilateral  Supine hooklying clam shell with contralateral leg isometric hold focus green band x 20 bilateral   Verbal review of existing HEP and adjustment to plan base off symptom response.   Manual Percussive device to Rt glute max.  Cues given for possible use at home.    TREATMENT       DATE: 11/29/23 TherEx See HEP - demonstrated with trial reps performed PRN, mod cues for comprehension    Self Care Educated on PT POC and clinical findings; plan to follow up with specialist if no improvement in symptoms Did discuss DN as possibility; will try other techniques first    PATIENT EDUCATION:  12/01/2023 Education details: HEP update Person educated: Patient Education method:  Explanation, Demonstration, and Handouts Education comprehension: verbalized understanding, returned demonstration, and needs further education  HOME EXERCISE PROGRAM: Access Code: [email or web address] URL: [email or web address] Date: 12/01/2023 Prepared by: Ozell Silvan  Exercises - Hooklying Single Knee to Chest (Mirrored)  - 2 x daily - 7 x weekly - 1 sets - 3 reps - 30 sec hold - Supine Piriformis Stretch with Foot on Ground  - 2 x daily - 7 x weekly - 1 sets - 3 reps - 30 sec hold - Supine Lower Trunk Rotation  - 2-3 x daily - 7 x weekly - 1 sets - 3-5 reps - 15 hold - Standing Lumbar Extension with Counter  - 3-5 x daily - 7 x weekly - 1 sets - 5-10 reps - Supine Bridge  - 1 x daily - 7 x weekly - 1-2 sets - 10 reps - 2 hold - Self Release   - 2-3 x daily - 7 x weekly - 1 sets - 1 reps - 30-60 sec hold   ASSESSMENT:  CLINICAL IMPRESSION:  Arrives today doing OK, MD updated some pain medicines and has ordered MRI. She felt up to PT after med change. Continued with progression of functional exercises as tolerated today, seemed to tolerate all interventions well without increased pain. Actually had a significant decrease in pain with flexibility work today especially with HS/piriformis/glute stretches.   OBJECTIVE IMPAIRMENTS: Abnormal gait, decreased mobility, difficulty walking, decreased strength, increased fascial restrictions, increased muscle spasms, postural dysfunction, and pain.   ACTIVITY LIMITATIONS: carrying, lifting, standing, sleeping, stairs, transfers, bed mobility, and locomotion level  PARTICIPATION LIMITATIONS: meal  prep, cleaning, laundry, driving, shopping, community activity, and yard work  PERSONAL FACTORS: 3+ comorbidities: Hx lung cancer, HTN, OA, hx CVA are also affecting patient's functional outcome.   REHAB POTENTIAL: Good  CLINICAL DECISION MAKING: Evolving/moderate complexity  EVALUATION COMPLEXITY: Moderate   GOALS: Goals reviewed with  patient? Yes  SHORT TERM GOALS: Target date: 12/20/2023  Independent with initial HEP Goal status: on going 12/01/2023   LONG TERM GOALS: Target date: 01/10/2024  Independent with final HEP Goal status: INITIAL  2.  PSFS score improved by 3 points Goal status: INITIAL  3.  RLE strength improved to 4/5 for improved pain and mobility Goal status: INIITAL  4.  Report pain < 3/10 with standing and walking for improved function Goal status: INITIAL  5.  Demonstrate ability to perform simulated gardening tasks without increase in pain for improved function. Goal status: INITIAL   PLAN:  PT FREQUENCY: 1-2x/week  PT DURATION: 6 weeks  PLANNED INTERVENTIONS: 97164- PT Re-evaluation, 97750- Physical Performance Testing, 97110-Therapeutic exercises, 97530- Therapeutic activity, 97112- Neuromuscular re-education, 97535- Self Care, 02859- Manual therapy, [phone]- Gait training, [phone]- Aquatic Therapy, [phone]- Electrical stimulation (unattended), [id number]- Ultrasound, [id number]- Ionotophoresis 4mg /ml Dexamethasone , 79439 (1-2 muscles), 20561 (3+ muscles)- Dry Needling, Patient/Family education, Stair training, Taping, Joint mobilization, Joint manipulation, Spinal manipulation, Spinal mobilization, Cryotherapy, and Moist heat.  PLAN FOR NEXT SESSION: Percussive device as desired.  Progress hip strengthening as able. Continue to improve knowledge and performance of HEP. Any updates on when MRI will be?    NEXT MD VISIT: 08/23/24  Josette Rough, PT, DPT 12/17/23 11:42 AM

## 2023-12-21 ENCOUNTER — Encounter: Payer: Self-pay | Admitting: Physical Therapy

## 2023-12-21 ENCOUNTER — Ambulatory Visit: Admitting: Physical Therapy

## 2023-12-21 DIAGNOSIS — M5459 Other low back pain: Secondary | ICD-10-CM | POA: Diagnosis not present

## 2023-12-21 DIAGNOSIS — M25551 Pain in right hip: Secondary | ICD-10-CM

## 2023-12-21 DIAGNOSIS — R29898 Other symptoms and signs involving the musculoskeletal system: Secondary | ICD-10-CM

## 2023-12-21 DIAGNOSIS — M5416 Radiculopathy, lumbar region: Secondary | ICD-10-CM | POA: Diagnosis not present

## 2023-12-21 DIAGNOSIS — M6281 Muscle weakness (generalized): Secondary | ICD-10-CM | POA: Diagnosis not present

## 2023-12-21 NOTE — Therapy (Signed)
 OUTPATIENT PHYSICAL THERAPY TREATMENT   Patient Name: Amy Dunn MRN: [id number] DOB:Aug 22, 1940, 82 y.o., female Today's Date: 12/21/2023  END OF SESSION:  PT End of Session - 12/21/23 1124     Visit Number 5    Number of Visits 12    Date for PT Re-Evaluation 01/10/24    Authorization Type [employer] Medicare $10 copay    Progress Note Due on Visit 10    PT Start Time 1100    PT Stop Time 1140    PT Time Calculation (min) 40 min    Activity Tolerance Patient tolerated treatment well    Behavior During Therapy WFL for tasks assessed/performed              Past Medical History:  Diagnosis Date   Acute peptic ulcer, unspecified site, with hemorrhage and perforation, without mention of obstruction    Cancer of left lung (HCC) 2015   Cough    GERD (gastroesophageal reflux disease)    Glaucoma    right eye   History of colonic polyps    Hypertension    Osteoarthritis of left shoulder    Primary osteoarthritis of left hip    Pulmonary nodule, left    Left Lower lobe   Stroke (HCC)    no residual   TIA (transient ischemic attack) 2011   transient blindness, speech- resolved   Past Surgical History:  Procedure Laterality Date   COLON SURGERY     EYE SURGERY Bilateral    cataracts   surgical repair of peptic ulcer     TUBAL LIGATION     VIDEO ASSISTED THORACOSCOPY (VATS)/WEDGE RESECTION Left 01/29/2014   Procedure: VIDEO ASSISTED THORACOSCOPY (VATS)/BASILAR SEGMENTECTOMY  LEFT LUNG LOWER LOBE WITH FROZEN SECTION, LEFT LOWER LOBECTOMY;  Surgeon: Elspeth JAYSON Millers, MD;  Location: [facility] OR;  Service: Thoracic;  Laterality: Left;   Patient Active Problem List   Diagnosis Date Noted   Piriformis syndrome of right side 11/11/2023   Trochanteric bursitis 11/11/2023   Acute right-sided low back pain with right-sided sciatica 10/06/2023   Weight loss 10/06/2023   Poor dentition 10/06/2023   Left leg pain 08/07/2019   Postmenopausal bleeding 07/17/2015   Chronic cough  03/24/2013   Routine general medical examination at a health care facility 03/14/2013   Benign meningioma of brain (HCC) 11/03/2012   Allergic rhinitis 10/10/2012   TIA (transient ischemic attack) 01/17/2012   Osteoarthritis 03/06/2010   Adenocarcinoma of lung, stage 1 (HCC) 02/17/2010   Essential hypertension 01/30/2009   G E REFLUX 01/30/2009    PCP: Rollene Almarie LABOR, MD  REFERRING PROVIDER: Rollene Almarie LABOR, *  REFERRING DIAG: M54.41 (ICD-10-CM) - Acute right-sided low back pain with right-sided sciatica  Rationale for Evaluation and Treatment: Rehabilitation  THERAPY DIAG:  Other low back pain  Pain in right hip  Radiculopathy, lumbar region  Other symptoms and signs involving the musculoskeletal system  Muscle weakness (generalized)  ONSET DATE: 10/28/23   SUBJECTIVE:  SUBJECTIVE STATEMENT: Pt reporting pain 6-7/10 pain in her Rt hip this morning, but stated she took a pain pill and reports no pain at rest.    PERTINENT HISTORY:  Hx lung cancer, HTN, OA, hx CVA  PAIN:  NPRS scale: 6-7/10 this morning, but no pain after pain medication Pain location: Rt hip Pain description: achy Aggravating factors: walking, transitions Relieving factors: new pain medicine can't remember the name   PRECAUTIONS:  None  RED FLAGS: None   WEIGHT BEARING RESTRICTIONS:  No  FALLS:  Has patient fallen in last 6 months? 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  DIAGNOSTIC FINDINGS:  1. No acute fracture or traumatic listhesis. 2. Facet arthropathy in the lower lumbar spine.  PATIENT SURVEYS:  Patient-Specific Activity Scoring Scheme  0 represents "unable to perform." 10 represents "able to perform at prior level. 0 1 2 3 4 5 6 7 8 9  10 (Date and Score)   Activity Eval  11/29/23 12/21/23   1. Traveling 1  2   2. Gardening 1 0  3. House Work 1 5  Score 1 2.3   Total score = sum of the activity scores/number of activities Minimum detectable change (90%CI) for average score = 2 points Minimum detectable change (90%CI) for single activity score = 3 points    COGNITIVE STATUS: 11/29/23 Within functional limits for tasks assessed   SENSATION: 11/29/23 WFL  POSTURE:  11/29/23 rounded shoulders, forward head, increased lumbar lordosis, and weight shift left   GAIT: 11/29/23 Comments: mild antalgic gait on Rt   PALPATION: 11/29/23 trigger point in glute med and piriformis noted on Rt; mild pain with lumbar mobs   LUMBAR ROM:   12/01/2023: No leg symptoms produced down to ankle today in clinic ROM check. Chief complaint was reported for Rt buttock.  ROM AROM  eval AROM 12/01/2023  Flexion WNL To toes without pain change.   Extension WNL with pain 75% WFL with no pain change  Right quadrant WNL   Left quadrant WNL    (Blank rows = not tested)     LOWER EXTREMITY MMT:    MMT Right eval Left eval  Hip flexion 3/5 4/5  Hip extension 3/5 with pain   Hip abduction 3/5 with pain   Hip adduction    Hip internal rotation    Hip external rotation    Knee flexion    Knee extension    Ankle dorsiflexion    Ankle plantarflexion    Ankle inversion    Ankle eversion     (Blank rows = not  tested)    SPECIAL TESTS:  11/29/23 Lumbar Slump test: mild symptoms on Rt but no change with decreased neural tension                   TODAY's TREATMENT:   12/21/23:  TherEx Nustep: level 4 x 7 minutes, pt able to perform continuous without rest breaks today Standing  hip extension x 15 bil LE c UE support Supine trunk rotation x 3 bil holding 20 seconds Supine SLR: 2 x 10 bil LE Supine piriformis stretch x 2 bil holding 30 sec Bridges: x 10 holding 5 sec Side lying clam c red TB 2 x 10  Seated hamstring curls red TB x 10 bil LE Neuro Re-Ed Hip hiking x 15 bil c tactile cues to help with technique TherAct Sit to stand:  2 x 5 with rest break Side stepping 5 feet x 4 c red TB around pt's knees Manual Percussive device to Rt glute max, performed in Lt sidelying    12/17/23  Lumbar rotation stretch 3x30 seconds  SKTC 3x30 seconds B  Figure 4 stretch 3x30 seconds B  Bridges x15  Hooklying TA sets 20 x3 seconds  HS stretches 2x30 seconds B seated  Hip hikes at counter x12 B  Nustep L5x5 minutes all four extremities, seat 6 multiple short rest breaks (limited by fatigue)    12/06/2023 Therex: Nustep Lvl 5 5 mins UE/LE - stopped due to general fatigue.  Sidelying Rt hip clam shell 2 x 10  (support from clinician to prevent rolling )  Supine hooklying lumbar trunk rotation 15 sec x 3 bilateral  Supine bridge 2 sec hold x10 Supine SKC 30 sec x 3 bilateral  Supine figure 4 pull towards 30 sec x 3 bilateral  Standing lumbar extension AROM 2 x 5  Continued cues for routine HEP use.   Manual Percussive device to Rt glute max, performed in Lt sidelying    PATIENT EDUCATION:  12/01/2023 Education details: HEP update Person educated: Patient Education method: Explanation, Demonstration, and Handouts Education comprehension: verbalized understanding, returned demonstration, and needs further education  HOME EXERCISE PROGRAM: Access Code: [email or web address] URL:  [email or web address] Date: 12/21/2023 Prepared by: Delon Lunger  Exercises - Hooklying Single Knee to Chest (Mirrored)  - 2 x daily - 7 x weekly - 1 sets - 3 reps - 30 sec hold - Supine Piriformis Stretch with Foot on Ground  - 2 x daily - 7 x weekly - 1 sets - 3 reps - 30 sec hold - Supine Lower Trunk Rotation  - 2-3 x daily - 7 x weekly - 1 sets - 3-5 reps - 15 hold - Standing Lumbar Extension with Counter  - 3-5 x daily - 7 x weekly - 1 sets - 5-10 reps - Supine Bridge  - 1 x daily - 7 x weekly - 1-2 sets - 10 reps - 2 hold - Self Release   - 2-3 x daily - 7 x weekly - 1 sets - 1 reps - 30-60 sec hold - Clamshell with Resistance  - 1 x daily - 7 x weekly - 10 reps - Seated Hamstring Curls with Resistance  - 1 x daily - 7 x weekly - 10 reps   ASSESSMENT:  CLINICAL IMPRESSION: Pt arriving today reporting no pain after taking her pain medication prior to therapy session. Pt still reporting pain with transfers and when first waking up in the morning when not on medication. Pt needing constant verbal and tactile cues for all exercises. Pt needed instructions to clear bilateral LE's when performing side stepping to prevent further fall risk. Pt's HEP was updated this visit. Recommending continue skilled PT to progress toward LTG's set.   OBJECTIVE IMPAIRMENTS: Abnormal gait, decreased mobility, difficulty walking, decreased strength, increased fascial restrictions, increased muscle spasms, postural dysfunction, and pain.   ACTIVITY LIMITATIONS: carrying, lifting, standing, sleeping,  stairs, transfers, bed mobility, and locomotion level  PARTICIPATION LIMITATIONS: meal prep, cleaning, laundry, driving, shopping, community activity, and yard work  PERSONAL FACTORS: 3+ comorbidities: Hx lung cancer, HTN, OA, hx CVA are also affecting patient's functional outcome.   REHAB POTENTIAL: Good  CLINICAL DECISION MAKING: Evolving/moderate complexity  EVALUATION COMPLEXITY:  Moderate   GOALS: Goals reviewed with patient? Yes  SHORT TERM GOALS: Target date: 12/20/2023  Independent with initial HEP Goal status: Met 12/21/23   LONG TERM GOALS: Target date: 01/10/2024  Independent with final HEP Goal status: INITIAL  2.  PSFS score improved by 3 points Goal status: On-going 12/21/23  3.  RLE strength improved to 4/5 for improved pain and mobility Goal status: INIITAL  4.  Report pain < 3/10 with standing and walking for improved function Goal status: On-going 12/21/23  5.  Demonstrate ability to perform simulated gardening tasks without increase in pain for improved function. Goal status: INITIAL   PLAN:  PT FREQUENCY: 1-2x/week  PT DURATION: 6 weeks  PLANNED INTERVENTIONS: 97164- PT Re-evaluation, 97750- Physical Performance Testing, 97110-Therapeutic exercises, 97530- Therapeutic activity, 97112- Neuromuscular re-education, 97535- Self Care, 02859- Manual therapy, [phone]- Gait training, [phone]- Aquatic Therapy, [phone]- Electrical stimulation (unattended), [id number]- Ultrasound, [id number]- Ionotophoresis 4mg /ml Dexamethasone , 79439 (1-2 muscles), 20561 (3+ muscles)- Dry Needling, Patient/Family education, Stair training, Taping, Joint mobilization, Joint manipulation, Spinal manipulation, Spinal mobilization, Cryotherapy, and Moist heat.  PLAN FOR NEXT SESSION: Percussive device as desired.  Progress hip strengthening as pt tolerated, consider BERG MRI scheduled for 12/27/23   NEXT MD VISIT: 08/23/24  Delon Lunger, PT, MPT 12/21/23 11:33 AM   12/21/23 11:33 AM

## 2023-12-24 ENCOUNTER — Encounter: Payer: Self-pay | Admitting: Physical Therapy

## 2023-12-24 ENCOUNTER — Ambulatory Visit: Admitting: Physical Therapy

## 2023-12-24 ENCOUNTER — Encounter: Admitting: Physical Therapy

## 2023-12-24 DIAGNOSIS — M6281 Muscle weakness (generalized): Secondary | ICD-10-CM

## 2023-12-24 DIAGNOSIS — M5459 Other low back pain: Secondary | ICD-10-CM | POA: Diagnosis not present

## 2023-12-24 DIAGNOSIS — M5416 Radiculopathy, lumbar region: Secondary | ICD-10-CM | POA: Diagnosis not present

## 2023-12-24 DIAGNOSIS — R29898 Other symptoms and signs involving the musculoskeletal system: Secondary | ICD-10-CM

## 2023-12-24 DIAGNOSIS — M25551 Pain in right hip: Secondary | ICD-10-CM

## 2023-12-24 NOTE — Therapy (Signed)
 OUTPATIENT PHYSICAL THERAPY TREATMENT   Patient Name: Amy Dunn MRN: [id number] DOB:Jun 04, 1940, 83 y.o., female Today's Date: 12/24/2023  END OF SESSION:  PT End of Session - 12/24/23 1158     Visit Number 6    Number of Visits 12    Date for PT Re-Evaluation 01/10/24    Authorization Type [employer] $10 copay    Progress Note Due on Visit 10    PT Start Time 1148    PT Stop Time 1226    PT Time Calculation (min) 38 min    Activity Tolerance Patient tolerated treatment well    Behavior During Therapy WFL for tasks assessed/performed               Past Medical History:  Diagnosis Date   Acute peptic ulcer, unspecified site, with hemorrhage and perforation, without mention of obstruction    Cancer of left lung (HCC) 2015   Cough    GERD (gastroesophageal reflux disease)    Glaucoma    right eye   History of colonic polyps    Hypertension    Osteoarthritis of left shoulder    Primary osteoarthritis of left hip    Pulmonary nodule, left    Left Lower lobe   Stroke (HCC)    no residual   TIA (transient ischemic attack) 2011   transient blindness, speech- resolved   Past Surgical History:  Procedure Laterality Date   COLON SURGERY     EYE SURGERY Bilateral    cataracts   surgical repair of peptic ulcer     TUBAL LIGATION     VIDEO ASSISTED THORACOSCOPY (VATS)/WEDGE RESECTION Left 01/29/2014   Procedure: VIDEO ASSISTED THORACOSCOPY (VATS)/BASILAR SEGMENTECTOMY  LEFT LUNG LOWER LOBE WITH FROZEN SECTION, LEFT LOWER LOBECTOMY;  Surgeon: Elspeth JAYSON Millers, MD;  Location: [facility] OR;  Service: Thoracic;  Laterality: Left;   Patient Active Problem List   Diagnosis Date Noted   Piriformis syndrome of right side 11/11/2023   Trochanteric bursitis 11/11/2023   Acute right-sided low back pain with right-sided sciatica 10/06/2023   Weight loss 10/06/2023   Poor dentition 10/06/2023   Left leg pain 08/07/2019   Postmenopausal bleeding 07/17/2015   Chronic cough  03/24/2013   Routine general medical examination at a health care facility 03/14/2013   Benign meningioma of brain (HCC) 11/03/2012   Allergic rhinitis 10/10/2012   TIA (transient ischemic attack) 01/17/2012   Osteoarthritis 03/06/2010   Adenocarcinoma of lung, stage 1 (HCC) 02/17/2010   Essential hypertension 01/30/2009   G E REFLUX 01/30/2009    PCP: Rollene Almarie LABOR, MD  REFERRING PROVIDER: Rollene Almarie LABOR, *  REFERRING DIAG: M54.41 (ICD-10-CM) - Acute right-sided low back pain with right-sided sciatica  Rationale for Evaluation and Treatment: Rehabilitation  THERAPY DIAG:  Other low back pain  Pain in right hip  Radiculopathy, lumbar region  Other symptoms and signs involving the musculoskeletal system  Muscle weakness (generalized)  ONSET DATE: 10/28/23   SUBJECTIVE:  SUBJECTIVE STATEMENT:  Not feeling much better, tend to feel worse in the mornings and better through the day.    PERTINENT HISTORY:  Hx lung cancer, HTN, OA, hx CVA  PAIN:  NPRS scale: 0/10 now  Pain location: Rt hip Pain description: achy Aggravating factors: walking, transitions Relieving factors: new pain medicine can't remember the name   PRECAUTIONS:  None  RED FLAGS: None   WEIGHT BEARING RESTRICTIONS:  No  FALLS:  Has patient fallen in last 6 months? 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  DIAGNOSTIC FINDINGS:  1. No acute fracture or traumatic listhesis. 2. Facet arthropathy in the lower lumbar spine.  PATIENT SURVEYS:  Patient-Specific Activity Scoring Scheme  0 represents "unable to perform." 10 represents "able to perform at prior level. 0 1 2 3 4 5 6 7 8 9  10 (Date and Score)   Activity Eval  11/29/23 12/21/23   1. Traveling 1  2   2. Gardening 1 0  3. House Work 1 5  Score 1 2.3   Total score = sum of the activity scores/number of activities Minimum detectable change (90%CI) for average score = 2 points Minimum detectable change (90%CI) for single activity score = 3 points    COGNITIVE STATUS: 11/29/23 Within functional limits for tasks assessed   SENSATION: 11/29/23 WFL  POSTURE:  11/29/23 rounded shoulders, forward head, increased lumbar lordosis, and weight shift left   GAIT: 11/29/23 Comments: mild antalgic gait on Rt   PALPATION: 11/29/23 trigger point in glute med and piriformis noted on Rt; mild pain with lumbar mobs   LUMBAR ROM:   12/01/2023: No leg symptoms produced down to ankle today in clinic ROM check. Chief complaint was reported for Rt buttock.  ROM AROM  eval AROM 12/01/2023  Flexion WNL To toes without pain change.   Extension WNL with pain 75% WFL with no pain change  Right quadrant WNL   Left quadrant WNL    (Blank rows = not tested)     LOWER EXTREMITY MMT:    MMT Right eval Left eval  Hip flexion 3/5 4/5  Hip extension 3/5 with pain   Hip abduction 3/5 with pain   Hip adduction    Hip internal rotation    Hip external rotation    Knee flexion    Knee extension    Ankle dorsiflexion    Ankle plantarflexion    Ankle inversion    Ankle eversion     (Blank rows = not tested)    SPECIAL TESTS:  11/29/23 Lumbar Slump test: mild symptoms  on Rt but no change with decreased neural tension                   TODAY's TREATMENT:    12/24/23  Nustep L4x8 minutes seat 6, all four extremities   Bridges + ABD into red TB 1x20 SLRs 2x10 B HS, piriformis, SKTC stretches  Tandem stance 3x30 seconds  B solid surface  SLS 3 x15 seconds B solid surface  STS blue foam pad x10 no UEs      12/21/23:  TherEx Nustep: level 4 x 7 minutes, pt able to perform continuous without rest breaks today Standing hip extension x 15 bil LE c UE support Supine trunk rotation x 3 bil holding 20 seconds Supine SLR: 2 x 10 bil LE Supine piriformis stretch x 2 bil holding 30 sec Bridges: x 10 holding 5 sec Side lying clam c red TB 2 x 10  Seated hamstring curls red TB x 10 bil LE Neuro Re-Ed Hip hiking x 15 bil c tactile cues to help with technique TherAct Sit to stand:  2 x 5 with rest break Side stepping 5 feet x 4 c red TB around pt's knees Manual Percussive device to Rt glute max, performed in Lt sidelying    12/17/23  Lumbar rotation stretch 3x30 seconds  SKTC 3x30 seconds B  Figure 4 stretch 3x30 seconds B  Bridges x15  Hooklying TA sets 20 x3 seconds  HS stretches 2x30 seconds B seated  Hip hikes at counter x12 B  Nustep L5x5 minutes all four extremities, seat 6 multiple short rest breaks (limited by fatigue)    12/06/2023 Therex: Nustep Lvl 5 5 mins UE/LE - stopped due to general fatigue.  Sidelying Rt hip clam shell 2 x 10  (support from clinician to prevent rolling )  Supine hooklying lumbar trunk rotation 15 sec x 3 bilateral  Supine bridge 2 sec hold x10 Supine SKC 30 sec x 3 bilateral  Supine figure 4 pull towards 30 sec x 3 bilateral  Standing lumbar extension AROM 2 x 5  Continued cues for routine HEP use.   Manual Percussive device to Rt glute max, performed in Lt sidelying    PATIENT EDUCATION:  12/01/2023 Education details: HEP update Person educated: Patient Education method: Explanation,  Demonstration, and Handouts Education comprehension: verbalized understanding, returned demonstration, and needs further education  HOME EXERCISE PROGRAM: Access Code: [email or web address] URL: [email or web address] Date: 12/21/2023 Prepared by: Delon Lunger  Exercises - Hooklying Single Knee to Chest (Mirrored)  - 2 x daily - 7 x weekly - 1 sets - 3 reps - 30 sec hold - Supine Piriformis Stretch with Foot on Ground  - 2 x daily - 7 x weekly - 1 sets - 3 reps - 30 sec hold - Supine Lower Trunk Rotation  - 2-3 x daily - 7 x weekly - 1 sets - 3-5 reps - 15 hold - Standing Lumbar Extension with Counter  - 3-5 x daily - 7 x weekly - 1 sets - 5-10 reps - Supine Bridge  - 1 x daily - 7 x weekly - 1-2 sets - 10 reps - 2 hold - Self Release   - 2-3 x daily - 7 x weekly - 1 sets - 1 reps - 30-60 sec hold - Clamshell with Resistance  - 1 x daily - 7 x weekly - 10 reps - Seated Hamstring Curls with Resistance  - 1 x daily - 7 x weekly - 10 reps   ASSESSMENT:  CLINICAL IMPRESSION:  Arrives today continuing to endorse morning pain that gets better through the day/with movement. She does have an MRI on Monday, based on pain patterns and response to interventions, I don't think this will show significant findings contributing to her pain. Kept working on mm flexibility and strength this visit and encouraged activity through the day. Will keep progressing and  working towards goals yet unmet.   OBJECTIVE IMPAIRMENTS: Abnormal gait, decreased mobility, difficulty walking, decreased strength, increased fascial restrictions, increased muscle spasms, postural dysfunction, and pain.   ACTIVITY LIMITATIONS: carrying, lifting, standing, sleeping, stairs, transfers, bed mobility, and locomotion level  PARTICIPATION LIMITATIONS: meal prep, cleaning, laundry, driving, shopping, community activity, and yard work  PERSONAL FACTORS: 3+ comorbidities: Hx lung cancer, HTN, OA, hx CVA are also affecting  patient's functional outcome.   REHAB POTENTIAL: Good  CLINICAL DECISION MAKING: Evolving/moderate complexity  EVALUATION COMPLEXITY: Moderate   GOALS: Goals reviewed with patient? Yes  SHORT TERM GOALS: Target date: 12/20/2023  Independent with initial HEP Goal status: Met 12/21/23   LONG TERM GOALS: Target date: 01/10/2024  Independent with final HEP Goal status: INITIAL  2.  PSFS score improved by 3 points Goal status: On-going 12/21/23  3.  RLE strength improved to 4/5 for improved pain and mobility Goal status: INIITAL  4.  Report pain < 3/10 with standing and walking for improved function Goal status: On-going 12/21/23  5.  Demonstrate ability to perform simulated gardening tasks without increase in pain for improved function. Goal status: INITIAL   PLAN:  PT FREQUENCY: 1-2x/week  PT DURATION: 6 weeks  PLANNED INTERVENTIONS: 97164- PT Re-evaluation, 97750- Physical Performance Testing, 97110-Therapeutic exercises, 97530- Therapeutic activity, 97112- Neuromuscular re-education, 97535- Self Care, 02859- Manual therapy, [phone]- Gait training, [phone]- Aquatic Therapy, [phone]- Electrical stimulation (unattended), [id number]- Ultrasound, [id number]- Ionotophoresis 4mg /ml Dexamethasone , 79439 (1-2 muscles), [id number] (3+ muscles)- Dry Needling, Patient/Family education, Stair training, Taping, Joint mobilization, Joint manipulation, Spinal manipulation, Spinal mobilization, Cryotherapy, and Moist heat.  PLAN FOR NEXT SESSION: Percussive device as desired.  Progress hip strengthening as pt tolerated, consider BERG and progress balance interventions   MRI scheduled for 12/27/23   NEXT MD VISIT: 08/23/24  Josette Rough, PT, DPT 12/24/23 12:28 PM

## 2023-12-27 ENCOUNTER — Ambulatory Visit
Admission: RE | Admit: 2023-12-27 | Discharge: 2023-12-27 | Disposition: A | Discharge status: Home / Self Care | Source: Ambulatory Visit | Attending: Internal Medicine | Admitting: Internal Medicine

## 2023-12-27 DIAGNOSIS — M5126 Other intervertebral disc displacement, lumbar region: Secondary | ICD-10-CM | POA: Diagnosis not present

## 2023-12-27 DIAGNOSIS — M5441 Lumbago with sciatica, right side: Secondary | ICD-10-CM

## 2023-12-27 DIAGNOSIS — M48061 Spinal stenosis, lumbar region without neurogenic claudication: Secondary | ICD-10-CM | POA: Diagnosis not present

## 2023-12-28 ENCOUNTER — Encounter: Admitting: Physical Therapy

## 2023-12-28 DIAGNOSIS — H43823 Vitreomacular adhesion, bilateral: Secondary | ICD-10-CM | POA: Diagnosis not present

## 2023-12-28 DIAGNOSIS — H401131 Primary open-angle glaucoma, bilateral, mild stage: Secondary | ICD-10-CM | POA: Diagnosis not present

## 2023-12-28 DIAGNOSIS — Z961 Presence of intraocular lens: Secondary | ICD-10-CM | POA: Diagnosis not present

## 2023-12-28 DIAGNOSIS — H348312 Tributary (branch) retinal vein occlusion, right eye, stable: Secondary | ICD-10-CM | POA: Diagnosis not present

## 2023-12-30 ENCOUNTER — Telehealth: Payer: Self-pay

## 2023-12-30 ENCOUNTER — Ambulatory Visit: Admitting: Rehabilitative and Restorative Service Providers"

## 2023-12-30 ENCOUNTER — Encounter: Payer: Self-pay | Admitting: Rehabilitative and Restorative Service Providers"

## 2023-12-30 DIAGNOSIS — M25551 Pain in right hip: Secondary | ICD-10-CM

## 2023-12-30 DIAGNOSIS — M6281 Muscle weakness (generalized): Secondary | ICD-10-CM

## 2023-12-30 DIAGNOSIS — R29898 Other symptoms and signs involving the musculoskeletal system: Secondary | ICD-10-CM | POA: Diagnosis not present

## 2023-12-30 DIAGNOSIS — M5416 Radiculopathy, lumbar region: Secondary | ICD-10-CM

## 2023-12-30 DIAGNOSIS — M5459 Other low back pain: Secondary | ICD-10-CM

## 2023-12-30 NOTE — Telephone Encounter (Signed)
 Copied from CRM [id number]. Topic: Clinical - Lab/Test Results >> Dec 30, 2023  3:37 PM Suzen RAMAN wrote: Reason for CRM: Patient would like a call back to discuss MRI results  CB#[phone]

## 2023-12-30 NOTE — Therapy (Signed)
 OUTPATIENT PHYSICAL THERAPY TREATMENT   Patient Name: Amy Dunn MRN: [id number] DOB:08-12-1940, 83 y.o., female Today's Date: 12/30/2023  END OF SESSION:  PT End of Session - 12/30/23 1139     Visit Number 7    Number of Visits 12    Date for PT Re-Evaluation 01/10/24    Authorization Type [employer] Medicare $10 copay    Progress Note Due on Visit 10    PT Start Time 1139    PT Stop Time 1218    PT Time Calculation (min) 39 min    Activity Tolerance Patient tolerated treatment well    Behavior During Therapy WFL for tasks assessed/performed                Past Medical History:  Diagnosis Date   Acute peptic ulcer, unspecified site, with hemorrhage and perforation, without mention of obstruction    Cancer of left lung (HCC) 2015   Cough    GERD (gastroesophageal reflux disease)    Glaucoma    right eye   History of colonic polyps    Hypertension    Osteoarthritis of left shoulder    Primary osteoarthritis of left hip    Pulmonary nodule, left    Left Lower lobe   Stroke (HCC)    no residual   TIA (transient ischemic attack) 2011   transient blindness, speech- resolved   Past Surgical History:  Procedure Laterality Date   COLON SURGERY     EYE SURGERY Bilateral    cataracts   surgical repair of peptic ulcer     TUBAL LIGATION     VIDEO ASSISTED THORACOSCOPY (VATS)/WEDGE RESECTION Left 01/29/2014   Procedure: VIDEO ASSISTED THORACOSCOPY (VATS)/BASILAR SEGMENTECTOMY  LEFT LUNG LOWER LOBE WITH FROZEN SECTION, LEFT LOWER LOBECTOMY;  Surgeon: Elspeth JAYSON Millers, MD;  Location: [facility] OR;  Service: Thoracic;  Laterality: Left;   Patient Active Problem List   Diagnosis Date Noted   Piriformis syndrome of right side 11/11/2023   Trochanteric bursitis 11/11/2023   Acute right-sided low back pain with right-sided sciatica 10/06/2023   Weight loss 10/06/2023   Poor dentition 10/06/2023   Left leg pain 08/07/2019   Postmenopausal bleeding 07/17/2015   Chronic cough  03/24/2013   Routine general medical examination at a health care facility 03/14/2013   Benign meningioma of brain (HCC) 11/03/2012   Allergic rhinitis 10/10/2012   TIA (transient ischemic attack) 01/17/2012   Osteoarthritis 03/06/2010   Adenocarcinoma of lung, stage 1 (HCC) 02/17/2010   Essential hypertension 01/30/2009   G E REFLUX 01/30/2009    PCP: Rollene Almarie LABOR, MD  REFERRING PROVIDER: Rollene Almarie LABOR, *  REFERRING DIAG: M54.41 (ICD-10-CM) - Acute right-sided low back pain with right-sided sciatica  Rationale for Evaluation and Treatment: Rehabilitation  THERAPY DIAG:  Other low back pain  Pain in right hip  Radiculopathy, lumbar region  Other symptoms and signs involving the musculoskeletal system  Muscle weakness (generalized)  ONSET DATE: 10/28/23   SUBJECTIVE:  SUBJECTIVE STATEMENT: Pt indicated having pian upon arrival and this morning.   Reported in back/hip area.   Pt indicated global rating of change at somewhat better +3.   Reported less numbness and less distal leg symptoms noted.     PERTINENT HISTORY:  Hx lung cancer, HTN, OA, hx CVA  PAIN:  NPRS scale:  8/10 Pain location: Rt hip Pain description: achy Aggravating factors: walking, transitions Relieving factors: new pain medicine can't remember the name   PRECAUTIONS:  None  RED FLAGS: None   WEIGHT BEARING RESTRICTIONS:  No  FALLS:  Has patient fallen in last 6 months? 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  DIAGNOSTIC FINDINGS:  1. No acute fracture or traumatic listhesis. 2. Facet arthropathy in the lower lumbar spine.  PATIENT SURVEYS:  Patient-Specific Activity Scoring Scheme  0 represents "unable to perform." 10 represents "able to perform at prior level. 0 1 2 3 4 5 6 7 8 9  10 (Date and Score)   Activity Eval  11/29/23 12/21/23   1. Traveling 1  2   2. Gardening 1 0  3. House Work 1 5  Score 1 2.3   Total score = sum of the activity scores/number of activities Minimum detectable change (90%CI) for average score = 2 points Minimum detectable change (90%CI) for single activity score = 3 points   COGNITIVE STATUS: 11/29/23 Within functional limits for tasks assessed   SENSATION: 11/29/23 WFL  POSTURE:  11/29/23 rounded shoulders, forward head, increased lumbar lordosis, and weight shift left   GAIT: 11/29/23 Comments: mild antalgic gait on Rt   PALPATION: 11/29/23 trigger point in glute med and piriformis noted on Rt; mild pain with lumbar mobs   LUMBAR ROM:   12/01/2023: No leg symptoms produced down to ankle today in clinic ROM check. Chief complaint was reported for Rt buttock.  ROM AROM  eval AROM 12/01/2023 AROM 12/30/2023  Flexion WNL To toes without pain change.  To toes without pain  Extension WNL with pain 75% WFL with no pain change 100% WFL   Right quadrant WNL    Left quadrant WNL     (Blank rows = not tested)     LOWER EXTREMITY MMT:    MMT Right eval Left eval Right 12/30/2023  Hip flexion 3/5 4/5   Hip extension 3/5 with pain    Hip abduction 3/5 with pain  4/5 c pain noted   Hip adduction     Hip internal rotation     Hip external rotation     Knee flexion      Knee extension     Ankle dorsiflexion     Ankle plantarflexion     Ankle inversion     Ankle eversion      (Blank rows = not tested)    SPECIAL TESTS:  11/29/23 Lumbar Slump test: mild symptoms on Rt but no change with decreased neural tension  TODAY's TREATMENT:      DATE: 12/30/2023 Therex: Standing lumbar extension AROM x 5 Supine bridge 2 x 10 with green band hip abduction hold in movement - 2 sec hold on lift.  Supine SKC 30 sec x 3 Rt leg Supine figure 4 pull towards 30 sec x 5 Rt leg Standing hip hike in doorway 3 sec hold x 10 bilateral   Manual Compression to Rt glute med with active clam shell movement for myofascial release x 10 reps several times in trigger points.  Percussive device to Rt glute max/med/min, performed in Lt sidelying   TODAY's TREATMENT:      DATE: 12/24/23  Nustep L4x8 minutes seat 6, all four extremities   Bridges + ABD into red TB 1x20 SLRs 2x10 B HS, piriformis, SKTC stretches  Tandem stance 3x30 seconds B solid surface  SLS 3 x15 seconds B solid surface  STS blue foam pad x10 no UEs      TODAY's TREATMENT:      DATE: 12/21/23:  TherEx Nustep: level 4 x 7 minutes, pt able to perform continuous without rest breaks today Standing hip extension x 15 bil LE c UE support Supine trunk rotation x 3 bil holding 20 seconds Supine SLR: 2 x 10 bil LE Supine piriformis stretch x 2 bil holding 30 sec Bridges: x 10 holding 5 sec Side lying clam c red TB 2 x 10  Seated hamstring curls red TB x 10 bil LE Neuro Re-Ed Hip hiking x 15 bil c tactile cues to help with technique TherAct Sit to stand:  2 x 5 with rest break Side stepping 5 feet x 4 c red TB around pt's knees Manual Percussive device to Rt glute max, performed in Lt sidelying    TODAY's TREATMENT:      DATE: 12/17/23  Lumbar rotation stretch 3x30 seconds  SKTC 3x30 seconds B  Figure 4 stretch 3x30 seconds B  Bridges x15  Hooklying TA sets 20 x3 seconds  HS  stretches 2x30 seconds B seated  Hip hikes at counter x12 B  Nustep L5x5 minutes all four extremities, seat 6 multiple short rest breaks (limited by fatigue)    TODAY's TREATMENT:      DATE: 12/06/2023 Therex: Nustep Lvl 5 5 mins UE/LE - stopped due to general fatigue.  Sidelying Rt hip clam shell 2 x 10  (support from clinician to prevent rolling )  Supine hooklying lumbar trunk rotation 15 sec x 3 bilateral  Supine bridge 2 sec hold x10 Supine SKC 30 sec x 3 bilateral  Supine figure 4 pull towards 30 sec x 3 bilateral  Standing lumbar extension AROM 2 x 5  Continued cues for routine HEP use.   Manual Percussive device to Rt glute max, performed in Lt sidelying    PATIENT EDUCATION:  12/30/2023 Education details: HEP update Person educated: Patient Education method: Explanation, Demonstration, and Handouts Education comprehension: verbalized understanding, returned demonstration, and needs further education  HOME EXERCISE PROGRAM: Access Code: [email or web address] URL: [email or web address] Date: 12/30/2023 Prepared by: Ozell Silvan  Exercises - Hooklying Single Knee to Chest (Mirrored)  - 2 x daily - 7 x weekly - 1 sets - 3 reps - 30 sec hold - Supine Piriformis Stretch with Foot on Ground  - 2 x daily - 7 x weekly - 1 sets - 3 reps - 30 sec hold - Supine Lower Trunk Rotation  - 2-3 x daily - 7 x weekly - 1 sets - 3-5 reps - 15 hold -  Standing Lumbar Extension  - 2-4 x daily - 7 x weekly - 1 sets - 5-10 reps - Self Release   - 2-3 x daily - 7 x weekly - 1 sets - 1 reps - 30-60 sec hold - Supine Bridge  - 1 x daily - 7 x weekly - 1-2 sets - 10 reps - 2 hold - Clamshell with Resistance  - 1 x daily - 7 x weekly - 2-3 sets - 10 reps - Seated Hamstring Curls with Resistance  - 1 x daily - 7 x weekly - 2-3 sets - 10 reps - Standing Hip Hiking  - 1 x daily - 7 x weekly - 1 sets - 10 reps - 3 hold   ASSESSMENT:  CLINICAL IMPRESSION: Some improvements noted in strength  and general mobility with patient reported improvement in distal leg symptoms.  Symptoms in buttock/hip area posterior/lateral are still present and can be severe.   Global rating of change was reported at +3 at this time.  Pt indicated still no connection about MRI reporting. Encouraged that she could call and follow up with MD office about scheduling time to review.  Less radicular symtoms are definitely a positive but continued localized Rt hip/buttock symptoms need better long term improvements.    OBJECTIVE IMPAIRMENTS: Abnormal gait, decreased mobility, difficulty walking, decreased strength, increased fascial restrictions, increased muscle spasms, postural dysfunction, and pain.   ACTIVITY LIMITATIONS: carrying, lifting, standing, sleeping, stairs, transfers, bed mobility, and locomotion level  PARTICIPATION LIMITATIONS: meal prep, cleaning, laundry, driving, shopping, community activity, and yard work  PERSONAL FACTORS: 3+ comorbidities: Hx lung cancer, HTN, OA, hx CVA are also affecting patient's functional outcome.   REHAB POTENTIAL: Good  CLINICAL DECISION MAKING: Evolving/moderate complexity  EVALUATION COMPLEXITY: Moderate   GOALS: Goals reviewed with patient? Yes  SHORT TERM GOALS: Target date: 12/20/2023  Independent with initial HEP Goal status: Met 12/21/23   LONG TERM GOALS: Target date: 01/10/2024  Independent with final HEP Goal status: on going 12/30/2023  2.  PSFS score improved by 3 points Goal status: On-going 12/21/23  3.  RLE strength improved to 4/5 for improved pain and mobility Goal status: on going 12/30/2023  4.  Report pain < 3/10 with standing and walking for improved function Goal status: On-going 12/21/23  5.  Demonstrate ability to perform simulated gardening tasks without increase in pain for improved function. Goal status: on going 12/30/2023   PLAN:  PT FREQUENCY: 1-2x/week  PT DURATION: 6 weeks  PLANNED INTERVENTIONS: 97164- PT  Re-evaluation, 97750- Physical Performance Testing, 97110-Therapeutic exercises, 97530- Therapeutic activity, 97112- Neuromuscular re-education, 97535- Self Care, 02859- Manual therapy, [phone]- Gait training, [phone]- Aquatic Therapy, [phone]- Electrical stimulation (unattended), 97035- Ultrasound, [id number]- Ionotophoresis 4mg /ml Dexamethasone , 79439 (1-2 muscles), 20561 (3+ muscles)- Dry Needling, Patient/Family education, Stair training, Taping, Joint mobilization, Joint manipulation, Spinal manipulation, Spinal mobilization, Cryotherapy, and Moist heat.  PLAN FOR NEXT SESSION: Manual therapy for symptom relief as necessary (helped in visit today).  Continue to improve hip strength.    NEXT MD VISIT: 08/23/24  Ozell Silvan, PT, DPT, OCS, ATC 12/30/23  12:16 PM

## 2023-12-31 NOTE — Telephone Encounter (Signed)
 Can you review for the patient so I can inform her about her results

## 2023-12-31 NOTE — Telephone Encounter (Signed)
 This is not read by radiology yet

## 2024-01-03 ENCOUNTER — Ambulatory Visit: Payer: Self-pay | Admitting: Internal Medicine

## 2024-01-03 ENCOUNTER — Other Ambulatory Visit: Payer: Self-pay | Admitting: Internal Medicine

## 2024-01-03 DIAGNOSIS — M5441 Lumbago with sciatica, right side: Secondary | ICD-10-CM

## 2024-01-04 ENCOUNTER — Ambulatory Visit: Admitting: Physical Therapy

## 2024-01-04 ENCOUNTER — Encounter: Payer: Self-pay | Admitting: Physical Therapy

## 2024-01-04 DIAGNOSIS — M6281 Muscle weakness (generalized): Secondary | ICD-10-CM | POA: Diagnosis not present

## 2024-01-04 DIAGNOSIS — M5459 Other low back pain: Secondary | ICD-10-CM

## 2024-01-04 DIAGNOSIS — M5416 Radiculopathy, lumbar region: Secondary | ICD-10-CM | POA: Diagnosis not present

## 2024-01-04 DIAGNOSIS — M25551 Pain in right hip: Secondary | ICD-10-CM | POA: Diagnosis not present

## 2024-01-04 DIAGNOSIS — R29898 Other symptoms and signs involving the musculoskeletal system: Secondary | ICD-10-CM

## 2024-01-04 NOTE — Therapy (Signed)
 OUTPATIENT PHYSICAL THERAPY TREATMENT   Patient Name: Amy Dunn MRN: [id number] DOB:Sep 05, 1940, 83 y.o., female Today's Date: 01/04/2024  END OF SESSION:  PT End of Session - 01/04/24 1216     Visit Number 8    Number of Visits 12    Date for PT Re-Evaluation 01/10/24    Authorization Type [employer] $10 copay    Progress Note Due on Visit 10    PT Start Time 1145    PT Stop Time 1223    PT Time Calculation (min) 38 min    Activity Tolerance Patient tolerated treatment well    Behavior During Therapy WFL for tasks assessed/performed                 Past Medical History:  Diagnosis Date   Acute peptic ulcer, unspecified site, with hemorrhage and perforation, without mention of obstruction    Cancer of left lung (HCC) 2015   Cough    GERD (gastroesophageal reflux disease)    Glaucoma    right eye   History of colonic polyps    Hypertension    Osteoarthritis of left shoulder    Primary osteoarthritis of left hip    Pulmonary nodule, left    Left Lower lobe   Stroke (HCC)    no residual   TIA (transient ischemic attack) 2011   transient blindness, speech- resolved   Past Surgical History:  Procedure Laterality Date   COLON SURGERY     EYE SURGERY Bilateral    cataracts   surgical repair of peptic ulcer     TUBAL LIGATION     VIDEO ASSISTED THORACOSCOPY (VATS)/WEDGE RESECTION Left 01/29/2014   Procedure: VIDEO ASSISTED THORACOSCOPY (VATS)/BASILAR SEGMENTECTOMY  LEFT LUNG LOWER LOBE WITH FROZEN SECTION, LEFT LOWER LOBECTOMY;  Surgeon: Elspeth JAYSON Millers, MD;  Location: [facility] OR;  Service: Thoracic;  Laterality: Left;   Patient Active Problem List   Diagnosis Date Noted   Piriformis syndrome of right side 11/11/2023   Trochanteric bursitis 11/11/2023   Acute right-sided low back pain with right-sided sciatica 10/06/2023   Weight loss 10/06/2023   Poor dentition 10/06/2023   Left leg pain 08/07/2019   Postmenopausal bleeding 07/17/2015   Chronic cough  03/24/2013   Routine general medical examination at a health care facility 03/14/2013   Benign meningioma of brain (HCC) 11/03/2012   Allergic rhinitis 10/10/2012   TIA (transient ischemic attack) 01/17/2012   Osteoarthritis 03/06/2010   Adenocarcinoma of lung, stage 1 (HCC) 02/17/2010   Essential hypertension 01/30/2009   G E REFLUX 01/30/2009    PCP: Rollene Almarie LABOR, MD  REFERRING PROVIDER: Rollene Almarie LABOR, *  REFERRING DIAG: M54.41 (ICD-10-CM) - Acute right-sided low back pain with right-sided sciatica  Rationale for Evaluation and Treatment: Rehabilitation  THERAPY DIAG:  Other low back pain  Pain in right hip  Radiculopathy, lumbar region  Other symptoms and signs involving the musculoskeletal system  Muscle weakness (generalized)  ONSET DATE: 10/28/23   SUBJECTIVE:  SUBJECTIVE STATEMENT: Pt indicated having pian upon arrival and this morning.   Reported in back/hip area.   Pt indicated global rating of change at somewhat better +3.   Reported less numbness and less distal leg symptoms noted.     PERTINENT HISTORY:  Hx lung cancer, HTN, OA, hx CVA  PAIN:  NPRS scale:  8/10 Pain location: Rt hip Pain description: achy Aggravating factors: walking, transitions Relieving factors: new pain medicine can't remember the name   PRECAUTIONS:  None  RED FLAGS: None   WEIGHT BEARING RESTRICTIONS:  No  FALLS:  Has patient fallen in last 6 months? 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  DIAGNOSTIC FINDINGS:  1. No acute fracture or traumatic listhesis. 2. Facet arthropathy in the lower lumbar spine.  PATIENT SURVEYS:  Patient-Specific Activity Scoring Scheme  0 represents "unable to perform." 10 represents "able to perform at prior level. 0 1 2 3 4 5 6 7 8 9  10 (Date and Score)   Activity Eval  11/29/23 12/21/23   1. Traveling 1  2   2. Gardening 1 0  3. House Work 1 5  Score 1 2.3   Total score = sum of the activity scores/number of activities Minimum detectable change (90%CI) for average score = 2 points Minimum detectable change (90%CI) for single activity score = 3 points   COGNITIVE STATUS: 11/29/23 Within functional limits for tasks assessed   SENSATION: 11/29/23 WFL  POSTURE:  11/29/23 rounded shoulders, forward head, increased lumbar lordosis, and weight shift left   GAIT: 11/29/23 Comments: mild antalgic gait on Rt   PALPATION: 11/29/23 trigger point in glute med and piriformis noted on Rt; mild pain with lumbar mobs   LUMBAR ROM:   12/01/2023: No leg symptoms produced down to ankle today in clinic ROM check. Chief complaint was reported for Rt buttock.  ROM AROM  eval AROM 12/01/2023 AROM 12/30/2023  Flexion WNL To toes without pain change.  To toes without pain  Extension WNL with pain 75% WFL with no pain change 100% WFL   Right quadrant WNL    Left quadrant WNL     (Blank rows = not tested)     LOWER EXTREMITY MMT:    MMT Right eval Left eval Right 12/30/2023  Hip flexion 3/5 4/5   Hip extension 3/5 with pain    Hip abduction 3/5 with pain  4/5 c pain noted   Hip adduction     Hip internal rotation     Hip external rotation     Knee flexion      Knee extension     Ankle dorsiflexion     Ankle plantarflexion     Ankle inversion     Ankle eversion      (Blank rows = not tested)    SPECIAL TESTS:  11/29/23 Lumbar Slump test: mild symptoms on Rt but no change with decreased neural tension  TODAY's TREATMENT:      DATE: 01/04/24:  TherEx Nustep: level 5 x7 minutes Standing hip extension x 15 bil LE c UE support Sitting trunk flexion and side bending using green physio ball rolling forward and then side  to side each x 10 .  Seated SLR: x 10 bil  Seated piriformis stretch x 2 bil LE holding 10 sec  Attempted prone hip extension, pt unable to lift LE off table keeping her knee straight,  Prone hamstring curls 2 x 10 bil LE Seated clam shells green TB 2 x 10 holding 3 sec Neuro Re-Ed Hip hiking x 15 bil c tactile cues to help with technique while on 2 inch step c UE support Standing trunk extension elbows on the wall x 5 holding 10 sec  TherAct Sit to stand:  2 x 5 with rest break Step ups on 4 inch step x 10 leading with each LE      TODAY's TREATMENT:      DATE: 12/30/2023 Therex: Standing lumbar extension AROM x 5 Supine bridge 2 x 10 with green band hip abduction hold in movement - 2 sec hold on lift.  Supine SKC 30 sec x 3 Rt leg Supine figure 4 pull towards 30 sec x 5 Rt leg Standing hip hike in doorway 3 sec hold x 10 bilateral   Manual Compression to Rt glute med with active clam shell movement for myofascial release x 10 reps several times in trigger points.  Percussive device to Rt glute max/med/min, performed in Lt sidelying   TODAY's TREATMENT:      DATE: 12/24/23  Nustep L4x8 minutes seat 6, all four extremities   Bridges + ABD into red TB 1x20 SLRs 2x10 B HS, piriformis, SKTC stretches  Tandem stance 3x30 seconds B solid surface  SLS 3 x15 seconds B solid surface  STS blue foam pad x10 no UEs      PATIENT EDUCATION:  12/30/2023 Education details: HEP update Person  educated: Patient Education method: Programmer, multimedia, Demonstration, and Handouts Education comprehension: verbalized understanding, returned demonstration, and needs further education  HOME EXERCISE PROGRAM: Access Code: [email or web address] URL: [email or web address] Date: 12/30/2023 Prepared by: Ozell Silvan  Exercises - Hooklying Single Knee to Chest (Mirrored)  - 2 x daily - 7 x weekly - 1 sets - 3 reps - 30 sec hold - Supine Piriformis Stretch with Foot on Ground  - 2 x daily - 7 x weekly - 1 sets - 3 reps - 30 sec hold - Supine Lower Trunk Rotation  - 2-3 x daily - 7 x weekly - 1 sets - 3-5 reps - 15 hold - Standing Lumbar Extension  - 2-4 x daily - 7 x weekly - 1 sets - 5-10 reps - Self Release   - 2-3 x daily - 7 x weekly - 1 sets - 1 reps - 30-60 sec hold - Supine Bridge  - 1 x daily - 7 x weekly - 1-2 sets - 10 reps - 2 hold - Clamshell with Resistance  - 1 x daily - 7 x weekly - 2-3 sets - 10 reps - Seated Hamstring Curls with Resistance  - 1 x daily - 7 x weekly - 2-3 sets - 10 reps - Standing Hip Hiking  - 1 x daily - 7 x weekly - 1 sets - 10 reps - 3 hold   ASSESSMENT:  CLINICAL IMPRESSION: Pt stating Dr. Rollene is sending her to [facility] for a referral following her MRI results. Pt's  endurance has improved and able to go 7 minutes on Nustep today at level 5 with no rests breaks. Pt needed repeated instructions and tactile cues for exercise technique. No new goals met this visit. Recommending continuation of skilled PT interventions.   Less radicular symtoms are definitely a positive but continued localized Rt hip/buttock symptoms need better long term improvements.    OBJECTIVE IMPAIRMENTS: Abnormal gait, decreased mobility, difficulty walking, decreased strength, increased fascial restrictions, increased muscle spasms, postural dysfunction, and pain.   ACTIVITY LIMITATIONS: carrying, lifting, standing, sleeping, stairs, transfers, bed mobility, and  locomotion level  PARTICIPATION LIMITATIONS: meal prep, cleaning, laundry, driving, shopping, community activity, and yard work  PERSONAL FACTORS: 3+ comorbidities: Hx lung cancer, HTN, OA, hx CVA are also affecting patient's functional outcome.   REHAB POTENTIAL: Good  CLINICAL DECISION MAKING: Evolving/moderate complexity  EVALUATION COMPLEXITY: Moderate   GOALS: Goals reviewed with patient? Yes  SHORT TERM GOALS: Target date: 12/20/2023  Independent with initial HEP Goal status: Met 12/21/23   LONG TERM GOALS: Target date: 01/10/2024  Independent with final HEP Goal status: on going 01/04/2024  2.  PSFS score improved by 3 points Goal status: on going 01/04/2024  3.  RLE strength improved to 4/5 for improved pain and mobility Goal status: on going 01/04/2024  4.  Report pain < 3/10 with standing and walking for improved function Goal status: on going 01/04/2024  5.  Demonstrate ability to perform simulated gardening tasks without increase in pain for improved function. Goal status: on going 01/04/2024   PLAN:  PT FREQUENCY: 1-2x/week  PT DURATION: 6 weeks  PLANNED INTERVENTIONS: 97164- PT Re-evaluation, 97750- Physical Performance Testing, 97110-Therapeutic exercises, 97530- Therapeutic activity, 97112- Neuromuscular re-education, 97535- Self Care, 02859- Manual therapy, [phone]- Gait training, [phone]- Aquatic Therapy, [phone]- Electrical stimulation (unattended), 97035- Ultrasound, [id number]- Ionotophoresis 4mg /ml Dexamethasone , 79439 (1-2 muscles), [phone] (3+ muscles)- Dry Needling, Patient/Family education, Stair training, Taping, Joint mobilization, Joint manipulation, Spinal manipulation, Spinal mobilization, Cryotherapy, and Moist heat.  PLAN FOR NEXT SESSION: Progress bil hip strength, percussion/manual as needed   NEXT MD VISIT: 08/23/24  Delon Lunger, PT, MPT 01/04/24 12:44 PM   01/04/24  12:44 PM

## 2024-01-07 ENCOUNTER — Encounter: Payer: Self-pay | Admitting: Rehabilitative and Restorative Service Providers"

## 2024-01-07 ENCOUNTER — Ambulatory Visit: Admitting: Rehabilitative and Restorative Service Providers"

## 2024-01-07 DIAGNOSIS — R29898 Other symptoms and signs involving the musculoskeletal system: Secondary | ICD-10-CM | POA: Diagnosis not present

## 2024-01-07 DIAGNOSIS — M6281 Muscle weakness (generalized): Secondary | ICD-10-CM | POA: Diagnosis not present

## 2024-01-07 DIAGNOSIS — M5416 Radiculopathy, lumbar region: Secondary | ICD-10-CM | POA: Diagnosis not present

## 2024-01-07 DIAGNOSIS — M5459 Other low back pain: Secondary | ICD-10-CM | POA: Diagnosis not present

## 2024-01-07 DIAGNOSIS — M25551 Pain in right hip: Secondary | ICD-10-CM

## 2024-01-07 NOTE — Therapy (Addendum)
 OUTPATIENT PHYSICAL THERAPY TREATMENT / DISCHARGE   Patient Name: Amy Dunn MRN: [id number] DOB:[birth year], 83 y.o., female Today's Date: 01/07/2024  END OF SESSION:  PT End of Session - 01/07/24 1004     Visit Number 9    Number of Visits 12    Date for PT Re-Evaluation 01/10/24    Authorization Type [employer] Medicare $10 copay    Progress Note Due on Visit 10    PT Start Time 1008    PT Stop Time 1047    PT Time Calculation (min) 39 min    Activity Tolerance Patient tolerated treatment well    Behavior During Therapy WFL for tasks assessed/performed                  Past Medical History:  Diagnosis Date   Acute peptic ulcer, unspecified site, with hemorrhage and perforation, without mention of obstruction    Cancer of left lung (HCC) 2015   Cough    GERD (gastroesophageal reflux disease)    Glaucoma    right eye   History of colonic polyps    Hypertension    Osteoarthritis of left shoulder    Primary osteoarthritis of left hip    Pulmonary nodule, left    Left Lower lobe   Stroke (HCC)    no residual   TIA (transient ischemic attack) 2011   transient blindness, speech- resolved   Past Surgical History:  Procedure Laterality Date   COLON SURGERY     EYE SURGERY Bilateral    cataracts   surgical repair of peptic ulcer     TUBAL LIGATION     VIDEO ASSISTED THORACOSCOPY (VATS)/WEDGE RESECTION Left 01/29/2014   Procedure: VIDEO ASSISTED THORACOSCOPY (VATS)/BASILAR SEGMENTECTOMY  LEFT LUNG LOWER LOBE WITH FROZEN SECTION, LEFT LOWER LOBECTOMY;  Surgeon: Elspeth JAYSON Millers, MD;  Location: [facility] OR;  Service: Thoracic;  Laterality: Left;   Patient Active Problem List   Diagnosis Date Noted   Piriformis syndrome of right side 11/11/2023   Trochanteric bursitis 11/11/2023   Acute right-sided low back pain with right-sided sciatica 10/06/2023   Weight loss 10/06/2023   Poor dentition 10/06/2023   Left leg pain 08/07/2019   Postmenopausal bleeding 07/17/2015    Chronic cough 03/24/2013   Routine general medical examination at a health care facility 03/14/2013   Benign meningioma of brain (HCC) 11/03/2012   Allergic rhinitis 10/10/2012   TIA (transient ischemic attack) 01/17/2012   Osteoarthritis 03/06/2010   Adenocarcinoma of lung, stage 1 (HCC) 02/17/2010   Essential hypertension 01/30/2009   G E REFLUX 01/30/2009    PCP: Rollene Almarie LABOR, MD  REFERRING PROVIDER: Rollene Almarie LABOR, *  REFERRING DIAG: M54.41 (ICD-10-CM) - Acute right-sided low back pain with right-sided sciatica  Rationale for Evaluation and Treatment: Rehabilitation  THERAPY DIAG:  Other low back pain  Pain in right hip  Radiculopathy, lumbar region  Other symptoms and signs involving the musculoskeletal system  Muscle weakness (generalized)  ONSET DATE: 10/28/23   SUBJECTIVE:  SUBJECTIVE STATEMENT: Pt indicated having complaints in morning again but movement did help reduce from 8/10 to about 5/10 upon arrival.  Symptoms noted in walking and at rest upon arrival.  Denied leg symptoms.    PERTINENT HISTORY:  Hx lung cancer, HTN, OA, hx CVA  PAIN:  NPRS scale:  5/10 Pain location: Rt hip Pain description: achy Aggravating factors: walking, transitions Relieving factors: new pain medicine can't remember the name   PRECAUTIONS:  None  RED FLAGS: None   WEIGHT BEARING RESTRICTIONS:  No  FALLS:  Has patient fallen in last 6 months? 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  DIAGNOSTIC FINDINGS:  1. No acute fracture or traumatic listhesis. 2. Facet arthropathy in the lower lumbar spine.  PATIENT SURVEYS:  Patient-Specific Activity Scoring Scheme  0 represents "unable to perform." 10 represents "able to perform at prior level. 0 1 2 3 4 5 6 7 8 9  10 (Date and Score)   Activity Eval  11/29/23 12/21/23   1. Traveling 1  2   2. Gardening 1 0  3. House Work 1 5  Score 1 2.3   Total score = sum of the activity scores/number of activities Minimum detectable change (90%CI) for average score = 2 points Minimum detectable change (90%CI) for single activity score = 3 points   COGNITIVE STATUS: 11/29/23 Within functional limits for tasks assessed   SENSATION: 11/29/23 WFL  POSTURE:  11/29/23 rounded shoulders, forward head, increased lumbar lordosis, and weight shift left   GAIT: 11/29/23 Comments: mild antalgic gait on Rt  PALPATION: 11/29/23 trigger point in glute med and piriformis noted on Rt; mild pain with lumbar mobs   LUMBAR ROM:   12/01/2023: No leg symptoms produced down to ankle today in clinic ROM check. Chief complaint was reported for Rt buttock.  ROM AROM  eval AROM 12/01/2023 AROM 12/30/2023  Flexion WNL To toes without pain change.  To toes without pain  Extension WNL with pain 75% WFL with no pain change 100% WFL   Right quadrant WNL    Left quadrant WNL     (Blank rows = not tested)     LOWER EXTREMITY MMT:    MMT Right eval Left eval Right 12/30/2023  Hip flexion 3/5 4/5   Hip extension 3/5 with pain    Hip abduction 3/5 with pain  4/5 c pain noted   Hip adduction     Hip internal rotation     Hip external rotation     Knee flexion      Knee extension     Ankle dorsiflexion     Ankle plantarflexion     Ankle inversion     Ankle eversion      (Blank rows = not tested)    SPECIAL TESTS:  11/29/23 Lumbar Slump test: mild symptoms on Rt but no change with decreased neural tension  TODAY's TREATMENT:      DATE: 01/07/2024 Therex: Nustep lvl 5 10 mins UE/LE for ROM and endurance progression.  Supine bridge with tband hip abduction hold 2 x 10 green band  Supine hooklying clam shell green band x 20 bilateral with isometric contralateral leg hold.  Supine single leg hip extension isometric into table 5 sec hold x 10 bilaterally  Seated SLR x 10 bilateral slow control focus, use of bolster behind back  Standing lumbar extension AROM x 5     Manual: Compression to Rt glute med with active clam shell movement for myofascial release x 10 reps several times in trigger points.     TODAY's TREATMENT:      DATE: 01/04/24:  TherEx Nustep: level 5 x7 minutes Standing hip extension x 15 bil LE c UE support Sitting trunk flexion and side bending using green physio ball rolling forward and then side  to side each x 10 .  Seated SLR: x 10 bil  Seated piriformis stretch x 2 bil LE holding 10 sec  Attempted prone hip extension, pt unable to lift LE off table keeping her knee straight,  Prone hamstring curls 2 x 10 bil LE Seated clam shells green TB 2 x 10 holding 3 sec Neuro Re-Ed Hip hiking x 15 bil c tactile cues to help with technique while on 2 inch step c UE support Standing trunk extension elbows on the wall x 5 holding 10 sec  TherAct Sit to stand:  2 x 5 with rest break Step ups on 4 inch step x 10 leading with each LE   TODAY's TREATMENT:      DATE: 12/30/2023 Therex: Standing lumbar extension AROM x 5 Supine bridge 2 x 10 with green band hip abduction hold in movement - 2 sec hold on lift.  Supine SKC 30 sec x 3 Rt leg Supine figure 4 pull towards 30 sec x 5 Rt leg Standing hip hike in doorway  3 sec hold x 10 bilateral   Manual Compression to Rt glute med with active clam shell movement for myofascial release x 10 reps several times in trigger points.  Percussive device to Rt glute max/med/min, performed in Lt sidelying   TODAY's TREATMENT:      DATE: 12/24/23  Nustep L4x8 minutes seat 6, all four extremities   Bridges + ABD into red TB 1x20 SLRs 2x10 B HS, piriformis, SKTC stretches  Tandem stance 3x30 seconds B solid surface  SLS 3 x15 seconds B solid surface  STS blue foam pad x10 no UEs      PATIENT EDUCATION:  12/30/2023 Education details: HEP update Person educated: Patient Education method: Programmer, multimedia, Demonstration, and Handouts Education comprehension: verbalized understanding, returned demonstration, and needs further education  HOME EXERCISE PROGRAM: Access Code: [email or web address] URL: [email or web address] Date: 12/30/2023 Prepared by: Ozell Silvan  Exercises - Hooklying Single Knee to Chest (Mirrored)  - 2 x daily - 7 x weekly - 1 sets - 3 reps - 30 sec hold - Supine Piriformis Stretch with Foot on Ground  - 2 x daily - 7 x weekly - 1 sets - 3 reps - 30 sec hold - Supine Lower Trunk Rotation  - 2-3 x daily - 7 x weekly - 1 sets - 3-5 reps - 15 hold - Standing Lumbar Extension  - 2-4 x daily - 7 x weekly - 1 sets - 5-10 reps - Self Release   - 2-3 x daily - 7 x weekly - 1 sets - 1  reps - 30-60 sec hold - Supine Bridge  - 1 x daily - 7 x weekly - 1-2 sets - 10 reps - 2 hold - Clamshell with Resistance  - 1 x daily - 7 x weekly - 2-3 sets - 10 reps - Seated Hamstring Curls with Resistance  - 1 x daily - 7 x weekly - 2-3 sets - 10 reps - Standing Hip Hiking  - 1 x daily - 7 x weekly - 1 sets - 10 reps - 3 hold   ASSESSMENT:  CLINICAL IMPRESSION: Continued to show benefit from in clinic mobility and manual intervention for symptom reduction within clinic time.  Localized symptoms still focus at this time for Rt buttock.  Following last visit  difficulty with prone hip extension, use of isometric hip extension in supine to tolerance used today with good overall tolerance.  May be helpful in HEP in future.   OBJECTIVE IMPAIRMENTS: Abnormal gait, decreased mobility, difficulty walking, decreased strength, increased fascial restrictions, increased muscle spasms, postural dysfunction, and pain.   ACTIVITY LIMITATIONS: carrying, lifting, standing, sleeping, stairs, transfers, bed mobility, and locomotion level  PARTICIPATION LIMITATIONS: meal prep, cleaning, laundry, driving, shopping, community activity, and yard work  PERSONAL FACTORS: 3+ comorbidities: Hx lung cancer, HTN, OA, hx CVA are also affecting patient's functional outcome.   REHAB POTENTIAL: Good  CLINICAL DECISION MAKING: Evolving/moderate complexity  EVALUATION COMPLEXITY: Moderate   GOALS: Goals reviewed with patient? Yes  SHORT TERM GOALS: Target date: 12/20/2023  Independent with initial HEP Goal status: Met 12/21/23   LONG TERM GOALS: Target date: 01/10/2024  Independent with final HEP Goal status: on going 01/04/2024  2.  PSFS score improved by 3 points Goal status: on going 01/04/2024  3.  RLE strength improved to 4/5 for improved pain and mobility Goal status: on going 01/04/2024  4.  Report pain < 3/10 with standing and walking for improved function Goal status: on going 01/04/2024  5.  Demonstrate ability to perform simulated gardening tasks without increase in pain for improved function. Goal status: on going 01/04/2024   PLAN:  PT FREQUENCY: 1-2x/week  PT DURATION: 6 weeks  PLANNED INTERVENTIONS: 97164- PT Re-evaluation, 97750- Physical Performance Testing, 97110-Therapeutic exercises, 97530- Therapeutic activity, 97112- Neuromuscular re-education, 97535- Self Care, 02859- Manual therapy, [phone]- Gait training, [phone]- Aquatic Therapy, [phone]- Electrical stimulation (unattended), 97035- Ultrasound, [id number]- Ionotophoresis 4mg /ml Dexamethasone , 79439 (1-2  muscles), 20561 (3+ muscles)- Dry Needling, Patient/Family education, Stair training, Taping, Joint mobilization, Joint manipulation, Spinal manipulation, Spinal mobilization, Cryotherapy, and Moist heat.  PLAN FOR NEXT SESSION: 10th visit progress note.  Auth date of 01/10/2024.    NEXT MD VISIT: 08/23/24  Ozell Silvan, PT, DPT, OCS, ATC 01/07/24  10:46 AM    PHYSICAL THERAPY DISCHARGE SUMMARY  Visits from Start of Care: 9  Current functional level related to goals / functional outcomes: See note   Remaining deficits: See note   Education / Equipment: HEP  Patient goals were partially met. Patient is being discharged due to not returning since the last visit.  Ozell Silvan, PT, DPT, OCS, ATC 01/20/24  2:05 PM

## 2024-01-12 NOTE — Telephone Encounter (Signed)
 Copied from CRM [id number]. Topic: General - Other >> Jan 12, 2024  9:11 AM Rosina BIRCH wrote: Reason for CRM: patient called stating she has been calling the spine specialist office and she can not get anyone on the phone. Patient stated she want to be referred to someone else because she is in pain. Patient stated she has called five times and left a message and no one has contacted her. Patient stated the pills the doctor gave does helped but then the pain comes back [phone]

## 2024-01-14 NOTE — Progress Notes (Signed)
**Note De-identified  [place] Obfuscation** Please advise 

## 2024-01-14 NOTE — Progress Notes (Signed)
 I have called and when you click new patient/referral it states that you have to leave a voicemail as they don't answer this and will return your call

## 2024-01-26 ENCOUNTER — Ambulatory Visit (INDEPENDENT_AMBULATORY_CARE_PROVIDER_SITE_OTHER): Admitting: Physical Medicine and Rehabilitation

## 2024-01-26 DIAGNOSIS — M5416 Radiculopathy, lumbar region: Secondary | ICD-10-CM | POA: Diagnosis not present

## 2024-01-26 DIAGNOSIS — G8929 Other chronic pain: Secondary | ICD-10-CM | POA: Diagnosis not present

## 2024-01-26 DIAGNOSIS — M5441 Lumbago with sciatica, right side: Secondary | ICD-10-CM | POA: Diagnosis not present

## 2024-01-26 DIAGNOSIS — M5116 Intervertebral disc disorders with radiculopathy, lumbar region: Secondary | ICD-10-CM

## 2024-01-26 NOTE — Progress Notes (Unsigned)
 Amy Dunn - 83 y.o. female MRN [id number]  Date of birth: Jun 04, 1940  Office Visit Note: Visit Date: 01/26/2024 PCP: Rollene Almarie LABOR, MD Referred by: Rollene Almarie LABOR, *  Subjective: Chief Complaint  Patient presents with   Lower Back - Pain   HPI: Amy Dunn is a 83 y.o. female who comes in today per the request of Dr. Almarie Rollene for evaluation of chronic, worsening and severe right sided lower back pain radiating to buttock, hip and down posterior leg. Pain ongoing since May. Her pain worsens with activity and walking. She describes pain as sharp sensation, currently rates as 7 out of 10. Some relief of pain with home exercise regimen, rest and use of medications. History of formal physical therapy with minimal relief of pain. She has tried Flexeril , oral Voltaren  and Voltaren  gel with minimal relief of pain. Recent lumbar MRI imaging shows progression of intermediate-sized disc bulge with endplate spurring at L5-S1, resulting in moderate bilateral foraminal stenosis and right greater than left lateral recess stenosis. No high grade spinal canal stenosis. History of lumbar epidural steroid injection many years ago with good relief of pain, she was also treated by Dr. Rockey Peru in the past. Patient denies focal weakness, numbness and tingling. No recent trauma or falls.       Review of Systems  Musculoskeletal:  Positive for back pain.  Neurological:  Negative for tingling, sensory change, focal weakness and weakness.  All other systems reviewed and are negative.  Otherwise per HPI.  Assessment & Plan: Visit Diagnoses:    ICD-10-CM   1. Chronic right-sided low back pain with right-sided sciatica  M54.41 Ambulatory referral to Physical Medicine Rehab   G89.29     2. Radiculopathy, lumbar region  M54.16 Ambulatory referral to Physical Medicine Rehab    3. Intervertebral disc disorders with radiculopathy, lumbar region  M51.16 Ambulatory referral to Physical  Medicine Rehab       Plan: Findings:  Chronic, worsening and severe right sided lower back pain radiating to buttock, hip and down posterior leg. Patient continues to have severe pain despite good conservative therapies such as formal physical therapy, home exercise regimen, rest and use of medications. Patients clinical presentation and exam are consistent with lumbar radiculopathy, more of S1 nerve pattern. There is intermediate sized disc bulge at L5-S1 causing foraminal and lateral recess stenosis at this level. Next step is to perform diagnostic and hopefully therapeutic right S1 transforaminal epidural steroid injection under fluoroscopic guidance. If good relief of pain we can repeat this procedure infrequently as needed. I discussed injection procedure with her in detail today, she has no questions at this time. Dr. Eldonna at bedside to answer any questions. No red flag symptoms noted upon exam today.     Meds & Orders: No orders of the defined types were placed in this encounter.   Orders Placed This Encounter  Procedures   Ambulatory referral to Physical Medicine Rehab    Follow-up: Return for Right S1 transforaminal epidural steroid injection.   Procedures: No procedures performed      Clinical History: Narrative & Impression EXAM: MRI LUMBAR SPINE 12/27/2023 03:46:51 PM   TECHNIQUE: Multiplanar multisequence MRI of the lumbar spine was performed without the administration of intravenous contrast.   COMPARISON: 07/13/2007   CLINICAL HISTORY: Low back pain, spondyloarthropathy suspected, xray done; Low back pain, symptoms persist with > 6 wks treatment. LBP RAD RT SIDE TO RT LEG; X 1 MO, NKI, NO SX; NO  INJECTIONS; HX LUNG CA; NO PRIOR   FINDINGS:   BONES AND ALIGNMENT: Diffuse heterogeneous bone marrow signal. Trace grade 1 anterolisthesis at L4-5.   SPINAL CORD: The conus terminates normally.   SOFT TISSUES: No paraspinal mass. Multiple bilateral renal cysts  measuring up to 5.4 cm. According to the Bosniak classification system of renal cystic masses, the finding is consistent with Bosniak I or II renal cysts and the recommendation is no further work-up is required.   L1-L2: No significant disc herniation. No spinal canal stenosis or neural foraminal narrowing.   L2-L3: No significant disc herniation. No spinal canal stenosis or neural foraminal narrowing.   L3-L4: No significant disc herniation. No spinal canal stenosis or neural foraminal narrowing.   L4-L5: Small disc bulge with narrowing of the lateral recesses. No spinal canal or neural foraminal stenosis.   L5-S1: Progression of intermediate-sized disc bulge with endplate spurring. Moderate bilateral foraminal stenosis and right greater than left lateral recess stenosis.   IMPRESSION: 1. Progression of intermediate-sized disc bulge with endplate spurring at L5-S1, resulting in moderate bilateral foraminal stenosis and right greater than left lateral recess stenosis. 2. Small disc bulge at L4-5 with narrowing of the lateral recesses, without spinal canal or neural foraminal stenosis.   Electronically signed by: Franky Stanford MD 01/02/2024 01:51 AM EDT RP Workstation: [id number]   She reports that she has quit smoking. Her smoking use included cigarettes. She started smoking about 81 years ago. She has been exposed to tobacco smoke. She has never used smokeless tobacco. No results for input(s): HGBA1C, LABURIC in the last 8760 hours.  Objective:  VS:  HT:    WT:   BMI:     BP:   HR: bpm  TEMP: ( )  RESP:  Physical Exam Vitals and nursing note reviewed.  HENT:     Head: Normocephalic and atraumatic.     Right Ear: External ear normal.     Left Ear: External ear normal.     Nose: Nose normal.     Mouth/Throat:     Mouth: Mucous membranes are moist.  Eyes:     Extraocular Movements: Extraocular movements intact.  Cardiovascular:     Rate and Rhythm: Normal  rate.     Pulses: Normal pulses.  Pulmonary:     Effort: Pulmonary effort is normal.  Abdominal:     General: Abdomen is flat. There is no distension.  Musculoskeletal:        General: Tenderness present.     Cervical back: Normal range of motion.     Comments: Patient is slow to rise from seated position to standing. Good lumbar range of motion. No pain noted with facet loading. 5/5 strength noted with bilateral hip flexion, knee flexion/extension, ankle dorsiflexion/plantarflexion and EHL. No clonus noted bilaterally. No pain upon palpation of greater trochanters. No pain with internal/external rotation of bilateral hips. Sensation intact bilaterally. Dysesthesias noted to right S1 dermatome. Positive slump test on the right. Ambulates without aid, gait steady.     Skin:    General: Skin is warm and dry.     Capillary Refill: Capillary refill takes less than 2 seconds.  Neurological:     General: No focal deficit present.     Mental Status: She is alert and oriented to person, place, and time.  Psychiatric:        Mood and Affect: Mood normal.        Behavior: Behavior normal.     Ortho Exam  Imaging:  No results found.  Past Medical/Family/Surgical/Social History: Medications & Allergies reviewed per EMR, new medications updated. Patient Active Problem List   Diagnosis Date Noted   Piriformis syndrome of right side 11/11/2023   Trochanteric bursitis 11/11/2023   Acute right-sided low back pain with right-sided sciatica 10/06/2023   Weight loss 10/06/2023   Poor dentition 10/06/2023   Left leg pain 08/07/2019   Postmenopausal bleeding 07/17/2015   Chronic cough 03/24/2013   Routine general medical examination at a health care facility 03/14/2013   Benign meningioma of brain (HCC) 11/03/2012   Allergic rhinitis 10/10/2012   TIA (transient ischemic attack) 01/17/2012   Osteoarthritis 03/06/2010   Adenocarcinoma of lung, stage 1 (HCC) 02/17/2010   Essential hypertension  01/30/2009   G E REFLUX 01/30/2009   Past Medical History:  Diagnosis Date   Acute peptic ulcer, unspecified site, with hemorrhage and perforation, without mention of obstruction    Cancer of left lung (HCC) 2015   Cough    GERD (gastroesophageal reflux disease)    Glaucoma    right eye   History of colonic polyps    Hypertension    Osteoarthritis of left shoulder    Primary osteoarthritis of left hip    Pulmonary nodule, left    Left Lower lobe   Stroke (HCC)    no residual   TIA (transient ischemic attack) 2011   transient blindness, speech- resolved   Family History  Problem Relation Age of Onset   Hypertension Mother    Heart disease Mother    Heart attack Mother    Cancer Father        Colon cancer   Past Surgical History:  Procedure Laterality Date   COLON SURGERY     EYE SURGERY Bilateral    cataracts   surgical repair of peptic ulcer     TUBAL LIGATION     VIDEO ASSISTED THORACOSCOPY (VATS)/WEDGE RESECTION Left 01/29/2014   Procedure: VIDEO ASSISTED THORACOSCOPY (VATS)/BASILAR SEGMENTECTOMY  LEFT LUNG LOWER LOBE WITH FROZEN SECTION, LEFT LOWER LOBECTOMY;  Surgeon: Elspeth JAYSON Millers, MD;  Location: [facility] OR;  Service: Thoracic;  Laterality: Left;   Social History   Occupational History   Occupation: retired  Tobacco Use   Smoking status: Former    Types: Cigarettes    Start date: 1944    Passive exposure: Past   Smokeless tobacco: Never   Tobacco comments:    2 ciggs per day  Vaping Use   Vaping status: Never Used  Substance and Sexual Activity   Alcohol use: No   Drug use: No   Sexual activity: Not Currently

## 2024-01-26 NOTE — Progress Notes (Unsigned)
 Pain Scale   Average Pain 6 Patient advising she has lower back pain radiating to right leg, walking increases the pain and resting help decrease pain        +Driver, -BT, -Dye Allergies.

## 2024-01-27 ENCOUNTER — Encounter: Payer: Self-pay | Admitting: Physical Medicine and Rehabilitation

## 2024-02-03 DIAGNOSIS — M5416 Radiculopathy, lumbar region: Secondary | ICD-10-CM | POA: Diagnosis not present

## 2024-02-04 ENCOUNTER — Telehealth: Payer: Self-pay | Admitting: Physical Medicine and Rehabilitation

## 2024-02-04 NOTE — Telephone Encounter (Signed)
 Patient called. She needs to cxl her appointment with Dr. Eldonna. Would like a call.

## 2024-02-18 ENCOUNTER — Telehealth: Payer: Self-pay

## 2024-02-18 NOTE — Telephone Encounter (Signed)
 Copied from CRM [id number]. Topic: Clinical - Medication Question >> Feb 18, 2024 11:36 AM Viola F wrote: Reason for CRM: Patient having surgery next Friday 02/25/24 was told she needs to bring her inhaler. Please send the albuterol  (VENTOLIN  HFA) 108 (90 Base) MCG/ACT inhaler to the [employer] on file.

## 2024-02-18 NOTE — Telephone Encounter (Signed)
 This has been sent in on 9/13

## 2024-02-21 ENCOUNTER — Other Ambulatory Visit: Payer: Self-pay | Admitting: Internal Medicine

## 2024-02-21 ENCOUNTER — Encounter: Admitting: Physical Medicine and Rehabilitation

## 2024-02-25 DIAGNOSIS — M5117 Intervertebral disc disorders with radiculopathy, lumbosacral region: Secondary | ICD-10-CM | POA: Diagnosis not present

## 2024-02-25 DIAGNOSIS — M5416 Radiculopathy, lumbar region: Secondary | ICD-10-CM | POA: Diagnosis not present

## 2024-02-25 DIAGNOSIS — M48061 Spinal stenosis, lumbar region without neurogenic claudication: Secondary | ICD-10-CM | POA: Diagnosis not present

## 2024-02-26 ENCOUNTER — Other Ambulatory Visit: Payer: Self-pay | Admitting: Internal Medicine

## 2024-04-03 ENCOUNTER — Encounter: Payer: Self-pay | Admitting: Radiology

## 2024-04-15 ENCOUNTER — Other Ambulatory Visit: Payer: Self-pay | Admitting: Internal Medicine

## 2024-05-30 ENCOUNTER — Other Ambulatory Visit: Payer: Self-pay | Admitting: Internal Medicine

## 2024-06-05 ENCOUNTER — Ambulatory Visit: Payer: Self-pay

## 2024-06-05 NOTE — Telephone Encounter (Signed)
 FYI Only or Action Required?: FYI only for provider: appointment scheduled on 06/06/24.  Patient was last seen in primary care on 12/13/2023 by Rollene Almarie LABOR, MD.  Called Nurse Triage reporting Otalgia.  Symptoms began yesterday.  Interventions attempted: OTC medications: Flonase  and Prescription medications: Tramadol .  Symptoms are: unchanged.  Triage Disposition: See Physician Within 24 Hours  Patient/caregiver understands and will follow disposition?: Yes Reason for Disposition  Earache  (Exceptions: Brief ear pain of lasting less than 60 minutes, or earache occurring during air travel.)  Answer Assessment - Initial Assessment Questions Using Flonase  without relief.    1. LOCATION: Which ear is involved?     Bilateral  2. ONSET: When did the ear pain start?      06/04/24-ears, nose stuffy one week ago  3. SEVERITY: How bad is the pain?  (Scale 1-10; mild, moderate or severe)     8/10 4. URI SYMPTOMS: Do you have a runny nose or cough?     Stuffy nose intermittently  5. FEVER: Do you have a fever? If Yes, ask: What is your temperature, how was it measured, and when did it start?     denies 6. CAUSE: Have you been swimming recently?, How often do you use Q-TIPS?, Have you had any recent air travel or scuba diving?     She uses Q tips 7. OTHER SYMPTOMS: Do you have any other symptoms? (e.g., decreased hearing, dizziness, headache, stiff neck, vomiting)     Right ear has clear drainage, right ear has hearing lose-not new.  Protocols used: Earache-A-AH Copied from CRM [id number]. Topic: Clinical - Red Word Triage >> Jun 05, 2024  2:15 PM Rea ORN wrote: Red Word that prompted transfer to Nurse Triage: left ear pain since last night

## 2024-06-06 ENCOUNTER — Ambulatory Visit (INDEPENDENT_AMBULATORY_CARE_PROVIDER_SITE_OTHER): Admitting: Internal Medicine

## 2024-06-06 ENCOUNTER — Encounter: Payer: Self-pay | Admitting: Internal Medicine

## 2024-06-06 VITALS — BP 136/82 | HR 69 | Temp 98.0°F | Ht 65.0 in | Wt 112.0 lb

## 2024-06-06 DIAGNOSIS — H6692 Otitis media, unspecified, left ear: Secondary | ICD-10-CM | POA: Insufficient documentation

## 2024-06-06 DIAGNOSIS — K5733 Diverticulitis of large intestine without perforation or abscess with bleeding: Secondary | ICD-10-CM | POA: Insufficient documentation

## 2024-06-06 DIAGNOSIS — R140 Abdominal distension (gaseous): Secondary | ICD-10-CM | POA: Insufficient documentation

## 2024-06-06 DIAGNOSIS — H6121 Impacted cerumen, right ear: Secondary | ICD-10-CM | POA: Diagnosis not present

## 2024-06-06 DIAGNOSIS — I10 Essential (primary) hypertension: Secondary | ICD-10-CM

## 2024-06-06 DIAGNOSIS — K588 Other irritable bowel syndrome: Secondary | ICD-10-CM | POA: Insufficient documentation

## 2024-06-06 MED ORDER — AZITHROMYCIN 250 MG PO TABS: ORAL_TABLET | ORAL | 1 refills | Status: AC

## 2024-06-06 NOTE — Addendum Note (Signed)
 Addended by: NORLEEN LYNWOOD ORN on: 06/06/2024 01:49 PM   Modules accepted: Level of Service

## 2024-06-06 NOTE — Patient Instructions (Signed)
 Please take all new medication as prescribed - the antibiotic  We will ask the nurse to clear the wax on the right  Please continue all other medications as before, and refills have been done if requested.  Please have the pharmacy call with any other refills you may need.  Please keep your appointments with your specialists as you may have planned

## 2024-06-06 NOTE — Assessment & Plan Note (Signed)
 BP Readings from Last 3 Encounters:  06/06/24 136/82  12/13/23 120/82  11/29/23 116/80   Stable, pt to continue medical treatment amlodipine  5 every day, hct 25 every day, losartan  100 qd

## 2024-06-06 NOTE — Progress Notes (Signed)
 Patient ID: Amy Dunn, female   DOB: March 08, 1941, 84 y.o.   MRN: [id number]        Chief Complaint: follow up left otitis media, right wax impaction with acute on chronic hearing loss, htn       HPI:  Amy Dunn is a 84 y.o. female here with c/o left ear pain and feels stopped up, flonase  not helping, denies fever chills, sinus congestion or ST or cough, but pain now moderate constant.  Pt denies chest pain, increased sob or doe, wheezing, orthopnea, PND, increased LE swelling, palpitations, dizziness or syncope.   Pt denies polydipsia, polyuria, or new focal neuro s/s.    Pt denies recent wt loss, night sweats, loss of appetite, or other constitutional symptoms  Also has acute on chronic right hearing loss and feels she may have increased wax impaction forming behind her hearing aid.         Wt Readings from Last 3 Encounters:  06/06/24 112 lb (50.8 kg)  12/13/23 111 lb (50.3 kg)  11/29/23 111 lb (50.3 kg)   BP Readings from Last 3 Encounters:  06/06/24 136/82  12/13/23 120/82  11/29/23 116/80         Past Medical History:  Diagnosis Date   Acute peptic ulcer, unspecified site, with hemorrhage and perforation, without mention of obstruction    Cancer of left lung (HCC) 2015   Cough    GERD (gastroesophageal reflux disease)    Glaucoma    right eye   History of colonic polyps    Hypertension    Osteoarthritis of left shoulder    Primary osteoarthritis of left hip    Pulmonary nodule, left    Left Lower lobe   Stroke (HCC)    no residual   TIA (transient ischemic attack) 2011   transient blindness, speech- resolved   Past Surgical History:  Procedure Laterality Date   COLON SURGERY     EYE SURGERY Bilateral    cataracts   surgical repair of peptic ulcer     TUBAL LIGATION     VIDEO ASSISTED THORACOSCOPY (VATS)/WEDGE RESECTION Left 01/29/2014   Procedure: VIDEO ASSISTED THORACOSCOPY (VATS)/BASILAR SEGMENTECTOMY  LEFT LUNG LOWER LOBE WITH FROZEN SECTION, LEFT LOWER  LOBECTOMY;  Surgeon: Elspeth JAYSON Millers, MD;  Location: [facility] OR;  Service: Thoracic;  Laterality: Left;    reports that she has quit smoking. Her smoking use included cigarettes. She started smoking about 82 years ago. She has been exposed to tobacco smoke. She has never used smokeless tobacco. She reports that she does not drink alcohol and does not use drugs. family history includes Cancer in her father; Heart attack in her mother; Heart disease in her mother; Hypertension in her mother. Allergies[1] Medications Ordered Prior to Encounter[2]      ROS:  All others reviewed and negative.  Objective        PE:  BP 136/82 (BP Location: Right Arm, Patient Position: Sitting, Cuff Size: Normal)   Pulse 69   Temp 98 F (36.7 C) (Oral)   Ht 5' 5 (1.651 m)   Wt 112 lb (50.8 kg)   SpO2 98%   BMI 18.64 kg/m                 Constitutional: Pt appears in NAD               HENT: Head: NCAT.                Right Ear:  External ear normal.  Right canal with impaction resolved                Left Ear: External ear normal. But left TM marked erythema, non bulging and canal is non erythema or swelling               Eyes: . Pupils are equal, round, and reactive to light. Conjunctivae and EOM are normal               Nose: without d/c or deformity               Neck: Neck supple. Gross normal ROM               Cardiovascular: Normal rate and regular rhythm.                 Pulmonary/Chest: Effort normal and breath sounds without rales or wheezing.                             Neurological: Pt is alert. At baseline orientation, motor grossly intact               Skin: Skin is warm. No rashes, no other new lesions, LE edema - none               Psychiatric: Pt behavior is normal without agitation   Micro: none  Cardiac tracings I have personally interpreted today:  none  Pertinent Radiological findings (summarize): none   Lab Results  Component Value Date   WBC 6.7 08/23/2023   HGB 11.1 (L)  08/23/2023   HCT 32.2 (L) 08/23/2023   PLT 229.0 08/23/2023   GLUCOSE 114 (H) 08/23/2023   CHOL 186 08/23/2023   TRIG 115.0 08/23/2023   HDL 49.90 08/23/2023   LDLDIRECT 102.0 10/28/2017   LDLCALC 113 (H) 08/23/2023   ALT 6 08/23/2023   AST 15 08/23/2023   NA 137 08/23/2023   K 3.8 08/23/2023   CL 99 08/23/2023   CREATININE 1.40 (H) 08/23/2023   BUN 17 08/23/2023   CO2 31 08/23/2023   TSH 0.66 10/22/2011   INR 0.97 01/25/2014   HGBA1C 5.7 (H) 01/17/2012   Assessment/Plan:  Amy Dunn is a 84 y.o. Black or African American [2] female with  has a past medical history of Acute peptic ulcer, unspecified site, with hemorrhage and perforation, without mention of obstruction, Cancer of left lung (HCC) (2015), Cough, GERD (gastroesophageal reflux disease), Glaucoma, History of colonic polyps, Hypertension, Osteoarthritis of left shoulder, Primary osteoarthritis of left hip, Pulmonary nodule, left, Stroke (HCC), and TIA (transient ischemic attack) (2011).  Left otitis media Can't r/o bacterial cause- for antibiotic zpack asd, continue flonase ,  to f/u any worsening symptoms or concerns   Right ear impacted cerumen Pt now for office irrigation, ok to use hearing aid again,  to f/u any worsening symptoms or concerns   Essential hypertension BP Readings from Last 3 Encounters:  06/06/24 136/82  12/13/23 120/82  11/29/23 116/80   Stable, pt to continue medical treatment amlodipine  5 every day, hct 25 every day, losartan  100 qd  Followup: Return if symptoms worsen or fail to improve.  Lynwood Rush, MD 06/06/2024 1:49 PM [facility]  Primary Care - [facility]     [1]  Allergies Allergen Reactions   Penicillins Itching  [2]  Current Outpatient Medications on File Prior to Visit  Medication Sig  Dispense Refill   albuterol  (VENTOLIN  HFA) 108 (90 Base) MCG/ACT inhaler INHALE 2 PUFFS BY MOUTH EVERY 6 HOURS AS NEEDED FOR WHEEZING OR SHORTNESS  OF BREATH 18 g 0   amLODipine  (NORVASC ) 5 MG tablet Take 1 tablet by mouth once daily 90 tablet 0   Cyanocobalamin (VITAMIN B12 PO) Take 1 tablet by mouth daily.     cyclobenzaprine  (FLEXERIL ) 5 MG tablet Take 1 tablet (5 mg total) by mouth 3 (three) times daily as needed for muscle spasms. 30 tablet 1   diclofenac  (VOLTAREN ) 75 MG EC tablet Take 1 tablet (75 mg total) by mouth 2 (two) times daily. 60 tablet 0   diclofenac  Sodium (VOLTAREN ) 1 % GEL Apply 2 g topically 4 (four) times daily. 100 g 3   fexofenadine (ALLEGRA) 180 MG tablet Take 180 mg by mouth daily as needed. For allergies     fluticasone  (FLONASE ) 50 MCG/ACT nasal spray Place 2 sprays into both nostrils daily as needed for allergies or rhinitis.     hydrochlorothiazide  (HYDRODIURIL ) 25 MG tablet Take 1 tablet by mouth once daily 90 tablet 0   HYDROcodone -acetaminophen  (NORCO/VICODIN) 5-325 MG tablet Take 1 tablet by mouth every 4 (four) hours as needed.     losartan  (COZAAR ) 100 MG tablet Take 1 tablet by mouth once daily 90 tablet 0   omeprazole  (PRILOSEC) 40 MG capsule Take 1 capsule (40 mg total) by mouth daily. 90 capsule 3   traMADol  (ULTRAM ) 50 MG tablet TAKE 1 TABLET BY MOUTH EVERY 12 HOURS AS NEEDED 60 tablet 5   No current facility-administered medications on file prior to visit.

## 2024-06-06 NOTE — Assessment & Plan Note (Signed)
 Can't r/o bacterial cause- for antibiotic zpack asd, continue flonase ,  to f/u any worsening symptoms or concerns

## 2024-06-06 NOTE — Assessment & Plan Note (Signed)
 Pt now for office irrigation, ok to use hearing aid again,  to f/u any worsening symptoms or concerns

## 2024-08-23 ENCOUNTER — Encounter: Admitting: Internal Medicine

## 2024-08-23 ENCOUNTER — Ambulatory Visit
